# Patient Record
Sex: Male | Born: 1995 | Hispanic: No | Marital: Single | Smoking: Never smoker
Health system: Southern US, Community
[De-identification: ages and names within clinical notes are randomized; demographics above are authoritative.]

## PROBLEM LIST (undated history)

## (undated) DIAGNOSIS — F319 Bipolar disorder, unspecified: Secondary | ICD-10-CM

## (undated) DIAGNOSIS — F909 Attention-deficit hyperactivity disorder, unspecified type: Secondary | ICD-10-CM

## (undated) DIAGNOSIS — E119 Type 2 diabetes mellitus without complications: Secondary | ICD-10-CM

## (undated) DIAGNOSIS — F4481 Dissociative identity disorder: Secondary | ICD-10-CM

## (undated) DIAGNOSIS — F913 Oppositional defiant disorder: Secondary | ICD-10-CM

## (undated) DIAGNOSIS — F419 Anxiety disorder, unspecified: Secondary | ICD-10-CM

## (undated) DIAGNOSIS — F209 Schizophrenia, unspecified: Secondary | ICD-10-CM

## (undated) HISTORY — PX: HERNIA REPAIR: SHX51

---

## 2016-07-31 ENCOUNTER — Inpatient Hospital Stay (HOSPITAL_COMMUNITY)
Admission: EM | Admit: 2016-07-31 | Discharge: 2016-08-03 | DRG: 639 | Disposition: A | Discharge status: Home / Self Care | Payer: Medicaid Other | Attending: Internal Medicine | Admitting: Internal Medicine

## 2016-07-31 ENCOUNTER — Encounter (HOSPITAL_COMMUNITY): Payer: Self-pay | Admitting: Emergency Medicine

## 2016-07-31 ENCOUNTER — Emergency Department (HOSPITAL_COMMUNITY): Payer: Medicaid Other

## 2016-07-31 DIAGNOSIS — F419 Anxiety disorder, unspecified: Secondary | ICD-10-CM | POA: Diagnosis present

## 2016-07-31 DIAGNOSIS — F909 Attention-deficit hyperactivity disorder, unspecified type: Secondary | ICD-10-CM | POA: Diagnosis present

## 2016-07-31 DIAGNOSIS — Z833 Family history of diabetes mellitus: Secondary | ICD-10-CM

## 2016-07-31 DIAGNOSIS — E1010 Type 1 diabetes mellitus with ketoacidosis without coma: Principal | ICD-10-CM | POA: Diagnosis present

## 2016-07-31 DIAGNOSIS — E872 Acidosis: Secondary | ICD-10-CM | POA: Diagnosis not present

## 2016-07-31 DIAGNOSIS — F913 Oppositional defiant disorder: Secondary | ICD-10-CM | POA: Diagnosis present

## 2016-07-31 DIAGNOSIS — R739 Hyperglycemia, unspecified: Secondary | ICD-10-CM

## 2016-07-31 DIAGNOSIS — I4581 Long QT syndrome: Secondary | ICD-10-CM | POA: Diagnosis present

## 2016-07-31 DIAGNOSIS — R109 Unspecified abdominal pain: Secondary | ICD-10-CM

## 2016-07-31 DIAGNOSIS — E1065 Type 1 diabetes mellitus with hyperglycemia: Secondary | ICD-10-CM | POA: Diagnosis present

## 2016-07-31 DIAGNOSIS — F319 Bipolar disorder, unspecified: Secondary | ICD-10-CM | POA: Diagnosis present

## 2016-07-31 DIAGNOSIS — R000 Tachycardia, unspecified: Secondary | ICD-10-CM | POA: Diagnosis present

## 2016-07-31 DIAGNOSIS — E8729 Other acidosis: Secondary | ICD-10-CM | POA: Diagnosis present

## 2016-07-31 DIAGNOSIS — Z794 Long term (current) use of insulin: Secondary | ICD-10-CM

## 2016-07-31 DIAGNOSIS — R112 Nausea with vomiting, unspecified: Secondary | ICD-10-CM | POA: Diagnosis not present

## 2016-07-31 DIAGNOSIS — Z8249 Family history of ischemic heart disease and other diseases of the circulatory system: Secondary | ICD-10-CM

## 2016-07-31 DIAGNOSIS — Z79899 Other long term (current) drug therapy: Secondary | ICD-10-CM

## 2016-07-31 HISTORY — DX: Bipolar disorder, unspecified: F31.9

## 2016-07-31 HISTORY — DX: Anxiety disorder, unspecified: F41.9

## 2016-07-31 HISTORY — DX: Attention-deficit hyperactivity disorder, unspecified type: F90.9

## 2016-07-31 HISTORY — DX: Type 2 diabetes mellitus without complications: E11.9

## 2016-07-31 HISTORY — DX: Oppositional defiant disorder: F91.3

## 2016-07-31 LAB — COMPREHENSIVE METABOLIC PANEL
ALK PHOS: 154 U/L — AB (ref 38–126)
ALK PHOS: 117 U/L (ref 38–126)
ALT: 23 U/L (ref 17–63)
ALT: 17 U/L (ref 17–63)
ANION GAP: 19 — AB (ref 5–15)
AST: 25 U/L (ref 15–41)
AST: 16 U/L (ref 15–41)
Albumin: 4.7 g/dL (ref 3.5–5.0)
Albumin: 3.7 g/dL (ref 3.5–5.0)
Anion gap: 17 — ABNORMAL HIGH (ref 5–15)
BILIRUBIN TOTAL: 1.9 mg/dL — AB (ref 0.3–1.2)
BUN: 21 mg/dL — ABNORMAL HIGH (ref 6–20)
BUN: 18 mg/dL (ref 6–20)
CALCIUM: 10.2 mg/dL (ref 8.9–10.3)
CHLORIDE: 99 mmol/L — AB (ref 101–111)
CHLORIDE: 105 mmol/L (ref 101–111)
CO2: 18 mmol/L — ABNORMAL LOW (ref 22–32)
CO2: 16 mmol/L — ABNORMAL LOW (ref 22–32)
CREATININE: 0.83 mg/dL (ref 0.61–1.24)
CREATININE: 0.86 mg/dL (ref 0.61–1.24)
Calcium: 8.5 mg/dL — ABNORMAL LOW (ref 8.9–10.3)
GFR calc Af Amer: >60 mL/min (ref >60)
Glucose, Bld: 116 mg/dL — ABNORMAL HIGH (ref 65–99)
Glucose, Bld: 146 mg/dL — ABNORMAL HIGH (ref 65–99)
Potassium: 4.4 mmol/L (ref 3.5–5.1)
Potassium: 3.9 mmol/L (ref 3.5–5.1)
Sodium: 136 mmol/L (ref 135–145)
Sodium: 138 mmol/L (ref 135–145)
Total Bilirubin: 1.5 mg/dL — ABNORMAL HIGH (ref 0.3–1.2)
Total Protein: 8.4 g/dL — ABNORMAL HIGH (ref 6.5–8.1)
Total Protein: 6.5 g/dL (ref 6.5–8.1)

## 2016-07-31 LAB — URINALYSIS, ROUTINE W REFLEX MICROSCOPIC
HGB URINE DIPSTICK: NEGATIVE
Leukocytes, UA: NEGATIVE
Nitrite: NEGATIVE
PROTEIN: NEGATIVE mg/dL
Specific Gravity, Urine: 1.036 — ABNORMAL HIGH (ref 1.005–1.030)
pH: 5 (ref 5.0–8.0)

## 2016-07-31 LAB — CBC
HEMATOCRIT: 40.9 % (ref 39.0–52.0)
HEMOGLOBIN: 13.7 g/dL (ref 13.0–17.0)
MCH: 27.9 pg (ref 26.0–34.0)
MCHC: 33.5 g/dL (ref 30.0–36.0)
MCV: 83.3 fL (ref 78.0–100.0)
Platelets: 299 10*3/uL (ref 150–400)
RBC: 4.91 MIL/uL (ref 4.22–5.81)
RDW: 13.8 % (ref 11.5–15.5)
WBC: 7.5 10*3/uL (ref 4.0–10.5)

## 2016-07-31 LAB — CBC WITH DIFFERENTIAL/PLATELET
Basophils Absolute: 0.1 10*3/uL (ref 0.0–0.1)
Basophils Relative: 1 %
EOS PCT: 1 %
Eosinophils Absolute: 0 10*3/uL (ref 0.0–0.7)
HCT: 44.3 % (ref 39.0–52.0)
Hemoglobin: 15.9 g/dL (ref 13.0–17.0)
LYMPHS ABS: 2.2 10*3/uL (ref 0.7–4.0)
Lymphocytes Relative: 32 %
MCH: 28.2 pg (ref 26.0–34.0)
MCHC: 35.9 g/dL (ref 30.0–36.0)
MCV: 78.7 fL (ref 78.0–100.0)
MONOS PCT: 6 %
Monocytes Absolute: 0.4 10*3/uL (ref 0.1–1.0)
NEUTROS ABS: 4.2 10*3/uL (ref 1.7–7.7)
NEUTROS PCT: 60 %
PLATELETS: 274 10*3/uL (ref 150–400)
RBC: 5.63 MIL/uL (ref 4.22–5.81)
RDW: 13.5 % (ref 11.5–15.5)
WBC: 6.9 10*3/uL (ref 4.0–10.5)

## 2016-07-31 LAB — BLOOD GAS, VENOUS
Acid-base deficit: 3.2 mmol/L — ABNORMAL HIGH (ref 0.0–2.0)
Bicarbonate: 19.9 mmol/L — ABNORMAL LOW (ref 20.0–28.0)
O2 SAT: 95.7 %
PATIENT TEMPERATURE: 98.6
pCO2, Ven: 31.8 mmHg — ABNORMAL LOW (ref 44.0–60.0)
pH, Ven: 7.413 (ref 7.250–7.430)
pO2, Ven: 78.4 mmHg — ABNORMAL HIGH (ref 32.0–45.0)

## 2016-07-31 LAB — CBG MONITORING, ED
GLUCOSE-CAPILLARY: 107 mg/dL — AB (ref 65–99)
Glucose-Capillary: 277 mg/dL — ABNORMAL HIGH (ref 65–99)

## 2016-07-31 LAB — URINE MICROSCOPIC-ADD ON
Bacteria, UA: NONE SEEN
RBC / HPF: NONE SEEN RBC/hpf (ref 0–5)
WBC, UA: NONE SEEN WBC/hpf (ref 0–5)

## 2016-07-31 LAB — GLUCOSE, CAPILLARY: GLUCOSE-CAPILLARY: 522 mg/dL — AB (ref 65–99)

## 2016-07-31 LAB — LIPASE, BLOOD: LIPASE: 13 U/L (ref 11–51)

## 2016-07-31 LAB — I-STAT CG4 LACTIC ACID, ED
LACTIC ACID, VENOUS: 1.19 mmol/L (ref 0.5–1.9)
LACTIC ACID, VENOUS: 0.55 mmol/L (ref 0.5–1.9)

## 2016-07-31 MED ORDER — SODIUM CHLORIDE 0.9 % IV SOLN
INTRAVENOUS | Status: DC
  Administered 2016-08-01: 01:00:00 via INTRAVENOUS
  Filled 2016-07-31: qty 2.5

## 2016-07-31 MED ORDER — ACETAMINOPHEN 325 MG PO TABS: 650.0000 mg | ORAL_TABLET | Freq: Four times a day (QID) | ORAL | Status: DC | PRN

## 2016-07-31 MED ORDER — ONDANSETRON HCL 4 MG/2ML IJ SOLN
4.0000 mg | Freq: Once | INTRAMUSCULAR | Status: AC
  Administered 2016-07-31: 4 mg via INTRAVENOUS
  Filled 2016-07-31: qty 2

## 2016-07-31 MED ORDER — INSULIN ASPART 100 UNIT/ML ~~LOC~~ SOLN: 0.0000 [IU] | Freq: Three times a day (TID) | SUBCUTANEOUS | Status: DC

## 2016-07-31 MED ORDER — ENOXAPARIN SODIUM 40 MG/0.4ML ~~LOC~~ SOLN
40.0000 mg | Freq: Every day | SUBCUTANEOUS | Status: DC
  Administered 2016-08-01 – 2016-08-02 (×3): 40 mg via SUBCUTANEOUS
  Filled 2016-07-31 (×3): qty 0.4

## 2016-07-31 MED ORDER — MORPHINE SULFATE (PF) 4 MG/ML IV SOLN
4.0000 mg | Freq: Once | INTRAVENOUS | Status: AC
Start: 2016-07-31 — End: 2016-07-31
  Administered 2016-07-31: 4 mg via INTRAVENOUS
  Filled 2016-07-31: qty 1

## 2016-07-31 MED ORDER — INSULIN GLARGINE 100 UNIT/ML ~~LOC~~ SOLN: 38.0000 [IU] | Freq: Every day | SUBCUTANEOUS | Status: DC

## 2016-07-31 MED ORDER — SODIUM CHLORIDE 0.9 % IV SOLN
INTRAVENOUS | Status: DC
  Administered 2016-08-01: 01:00:00 via INTRAVENOUS

## 2016-07-31 MED ORDER — INSULIN ASPART 100 UNIT/ML ~~LOC~~ SOLN
15.0000 [IU] | Freq: Once | SUBCUTANEOUS | Status: AC
  Administered 2016-07-31: 15 [IU] via SUBCUTANEOUS

## 2016-07-31 MED ORDER — ACETAMINOPHEN 650 MG RE SUPP: 650.0000 mg | Freq: Four times a day (QID) | RECTAL | Status: DC | PRN

## 2016-07-31 MED ORDER — SODIUM CHLORIDE 0.9 % IV BOLUS (SEPSIS)
1000.0000 mL | Freq: Once | INTRAVENOUS | Status: AC
Start: 2016-07-31 — End: 2016-07-31
  Administered 2016-07-31: 1000 mL via INTRAVENOUS

## 2016-07-31 MED ORDER — LISDEXAMFETAMINE DIMESYLATE 20 MG PO CAPS
60.0000 mg | ORAL_CAPSULE | ORAL | Status: DC
Start: 2016-08-01 — End: 2016-08-03
  Administered 2016-08-01 – 2016-08-03 (×3): 60 mg via ORAL
  Filled 2016-07-31 (×3): qty 3

## 2016-07-31 MED ORDER — SODIUM CHLORIDE 0.9 % IV BOLUS (SEPSIS)
1000.0000 mL | Freq: Once | INTRAVENOUS | Status: AC
  Administered 2016-07-31 (×2): 1000 mL via INTRAVENOUS

## 2016-07-31 MED ORDER — LORATADINE 10 MG PO TABS
10.0000 mg | ORAL_TABLET | Freq: Every day | ORAL | Status: DC
  Administered 2016-08-01 – 2016-08-03 (×3): 10 mg via ORAL
  Filled 2016-07-31 (×3): qty 1

## 2016-07-31 MED ORDER — ONDANSETRON HCL 4 MG PO TABS: 4.0000 mg | ORAL_TABLET | Freq: Four times a day (QID) | ORAL | Status: DC | PRN

## 2016-07-31 MED ORDER — POTASSIUM CHLORIDE 10 MEQ/100ML IV SOLN
10.0000 meq | INTRAVENOUS | Status: DC
  Administered 2016-08-01 (×3): 10 meq via INTRAVENOUS
  Filled 2016-07-31 (×3): qty 100

## 2016-07-31 MED ORDER — GUANFACINE HCL 2 MG PO TABS
2.0000 mg | ORAL_TABLET | Freq: Every day | ORAL | Status: DC
  Administered 2016-08-01 – 2016-08-02 (×3): 2 mg via ORAL
  Filled 2016-07-31 (×3): qty 1

## 2016-07-31 MED ORDER — SODIUM CHLORIDE 0.9 % IV SOLN
INTRAVENOUS | Status: AC
  Administered 2016-07-31 – 2016-08-01 (×3): via INTRAVENOUS

## 2016-07-31 MED ORDER — TRAZODONE HCL 100 MG PO TABS
100.0000 mg | ORAL_TABLET | Freq: Every day | ORAL | Status: DC
  Administered 2016-08-01 – 2016-08-02 (×3): 100 mg via ORAL
  Filled 2016-07-31: qty 1
  Filled 2016-07-31: qty 2
  Filled 2016-07-31: qty 1

## 2016-07-31 MED ORDER — ZIPRASIDONE HCL 80 MG PO CAPS
80.0000 mg | ORAL_CAPSULE | Freq: Two times a day (BID) | ORAL | Status: DC
  Administered 2016-08-01 – 2016-08-03 (×5): 80 mg via ORAL
  Filled 2016-07-31 (×6): qty 1

## 2016-07-31 MED ORDER — ONDANSETRON HCL 4 MG/2ML IJ SOLN
4.0000 mg | Freq: Four times a day (QID) | INTRAMUSCULAR | Status: DC | PRN
Start: 2016-07-31 — End: 2016-08-03
  Administered 2016-07-31: 4 mg via INTRAVENOUS
  Filled 2016-07-31: qty 2

## 2016-07-31 MED ORDER — DEXTROSE-NACL 5-0.45 % IV SOLN: INTRAVENOUS | Status: DC

## 2016-07-31 NOTE — ED Provider Notes (Signed)
[facility]-EMERGENCY DEPT Provider Note   CSN: [id number] Arrival date & time: 07/31/16  1303     History   Chief Complaint Chief Complaint  Patient presents with  . Hyperglycemia  . Abdominal Pain    HPI Dylan Butler is a 20 y.o. male with a past medical history significant for type 1 diabetes with prior DKA, bipolar disorder, ADHD, and ODD who presents for chills, cough, polyuria, dysuria, abdominal pain, and hyperglycemia. Patient reports that he recently moved to [place] from [place] and lost his glucometer temporarily as well as testing strips. He says that for the last 6 days, he has begun having symptoms including severe abdominal pain. Patient describes the pain as diffusely in his abdomen and 10 out of 10 severity. He reports nausea with approximately 2-3 episodes of vomiting per day over the last several days. He reports some dysuria and polyuria. He reports a dry cough but no productive sputum. He denies chest pain or shortness of breath. He says that he is feeling very thirsty and dehydrated. He reports that he normally takes Lantus and Humalog. He says that 2 years ago he had DKA in the setting of pneumonia. She denies any new neurologic complaints aside from some lower extremity tingling which is chronic. Patient was tachycardic in the 130s on my evaluation.      The history is provided by the patient and a parent. No language interpreter was used.  Hyperglycemia  Blood sugar level PTA:  300 Severity:  Severe Onset quality:  Gradual Duration:  1 week Timing:  Intermittent Progression:  Worsening Chronicity:  Recurrent Diabetes status:  Controlled with insulin Current diabetic therapy:  Lantus and Humalog Context: not change in medication, not new diabetes diagnosis and not recent illness   Relieved by:  Insulin, diet and oral agents Associated symptoms: abdominal pain, dehydration, fatigue, increased thirst and nausea   Associated symptoms: no altered mental status,  no chest pain, no dizziness and no shortness of breath   Risk factors: hx of DKA     Past Medical History:  Diagnosis Date  . ADHD (attention deficit hyperactivity disorder)   . Anxiety   . Bipolar 1 disorder (HCC)   . Diabetes mellitus without complication (HCC)   . ODD (oppositional defiant disorder)     There are no active problems to display for this patient.   History reviewed. No pertinent surgical history.     Home Medications    Prior to Admission medications   Medication Sig Start Date End Date Taking? Authorizing Provider  acetaminophen (TYLENOL) 500 MG tablet Take 500 mg by mouth every 6 (six) hours as needed for headache.   Yes Historical Provider, MD  cetirizine (ZYRTEC) 10 MG tablet Take 10 mg by mouth daily.   Yes Historical Provider, MD  guanFACINE (TENEX) 2 MG tablet Take 2 mg by mouth at bedtime.   Yes Historical Provider, MD  insulin glargine (LANTUS) 100 UNIT/ML injection Inject 38 Units into the skin at bedtime.   Yes Historical Provider, MD  insulin lispro (HUMALOG) 100 UNIT/ML injection Inject 15-25 Units into the skin 3 (three) times daily before meals.   Yes Historical Provider, MD  lisdexamfetamine (VYVANSE) 60 MG capsule Take 60 mg by mouth every morning.   Yes Historical Provider, MD  ondansetron (ZOFRAN-ODT) 4 MG disintegrating tablet Take 4 mg by mouth every 8 (eight) hours as needed for nausea or vomiting.   Yes Historical Provider, MD  traZODone (DESYREL) 100 MG tablet Take 100 mg  by mouth at bedtime.   Yes Historical Provider, MD  ziprasidone (GEODON) 80 MG capsule Take 80 mg by mouth 2 (two) times daily with a meal.   Yes Historical Provider, MD    Family History History reviewed. No pertinent family history.  Social History Social History  Substance Use Topics  . Smoking status: Never Smoker  . Smokeless tobacco: Never Used  . Alcohol use Not on file     Allergies   Review of patient's allergies indicates no known  allergies.   Review of Systems Review of Systems  Constitutional: Positive for chills and fatigue.  HENT: Negative for congestion.   Eyes: Negative for visual disturbance.  Respiratory: Negative for choking, shortness of breath and stridor.   Cardiovascular: Negative for chest pain and palpitations.  Gastrointestinal: Positive for abdominal pain and nausea. Negative for constipation and diarrhea.  Endocrine: Positive for polydipsia.  Genitourinary: Negative for flank pain.  Musculoskeletal: Negative for back pain.  Skin: Negative for wound.  Neurological: Negative for dizziness and headaches.  All other systems reviewed and are negative.    Physical Exam Updated Vital Signs BP 114/74 (BP Location: Right Arm)   Pulse 114   Temp 99 F (37.2 C) (Oral)   Resp 18   Ht 5\' 1"  (1.549 m)   Wt 136 lb (61.7 kg)   SpO2 96%   BMI 25.70 kg/m   Physical Exam  Constitutional: He is oriented to person, place, and time. He appears well-developed and well-nourished. No distress.  HENT:  Head: Normocephalic and atraumatic.  Mouth/Throat: Oropharynx is clear and moist. No oropharyngeal exudate.  Eyes: Conjunctivae are normal. Pupils are equal, round, and reactive to light.  Neck: Normal range of motion. Neck supple.  Cardiovascular: Regular rhythm.  Tachycardia present.   No murmur heard. Pulmonary/Chest: Effort normal and breath sounds normal. No stridor. No respiratory distress. He has no wheezes. He has no rales. He exhibits no tenderness.  Abdominal: Soft. There is tenderness. There is no rebound.  Musculoskeletal: He exhibits no edema or tenderness.  Neurological: He is alert and oriented to person, place, and time. He has normal reflexes. No cranial nerve deficit. He exhibits normal muscle tone. Coordination normal.  Skin: Skin is warm and dry. Capillary refill takes less than 2 seconds. He is not diaphoretic. No pallor.  Psychiatric: He has a normal mood and affect.  Nursing note  and vitals reviewed.    ED Treatments / Results  Labs (all labs ordered are listed, but only abnormal results are displayed) Labs Reviewed  COMPREHENSIVE METABOLIC PANEL - Abnormal; Notable for the following:       Result Value   Chloride 99 (*)    CO2 18 (*)    Glucose, Bld 116 (*)    BUN 21 (*)    Total Protein 8.4 (*)    Alkaline Phosphatase 154 (*)    Total Bilirubin 1.5 (*)    Anion gap 19 (*)    All other components within normal limits  URINALYSIS, ROUTINE W REFLEX MICROSCOPIC (NOT AT [facility]) - Abnormal; Notable for the following:    Specific Gravity, Urine 1.036 (*)    Glucose, UA >1000 (*)    Bilirubin Urine MODERATE (*)    Ketones, ur >80 (*)    All other components within normal limits  BLOOD GAS, VENOUS - Abnormal; Notable for the following:    pCO2, Ven 31.8 (*)    pO2, Ven 78.4 (*)    Bicarbonate 19.9 (*)  Acid-base deficit 3.2 (*)    All other components within normal limits  URINE MICROSCOPIC-ADD ON - Abnormal; Notable for the following:    Squamous Epithelial / LPF 0-5 (*)    All other components within normal limits  COMPREHENSIVE METABOLIC PANEL - Abnormal; Notable for the following:    CO2 16 (*)    Glucose, Bld 146 (*)    Calcium 8.5 (*)    Total Bilirubin 1.9 (*)    Anion gap 17 (*)    All other components within normal limits  BASIC METABOLIC PANEL - Abnormal; Notable for the following:    Sodium 131 (*)    Chloride 99 (*)    CO2 11 (*)    Glucose, Bld 505 (*)    Calcium 8.8 (*)    Anion gap 21 (*)    All other components within normal limits  CBC - Abnormal; Notable for the following:    HCT 38.8 (*)    All other components within normal limits  COMPREHENSIVE METABOLIC PANEL - Abnormal; Notable for the following:    Sodium 134 (*)    CO2 19 (*)    Glucose, Bld 115 (*)    Calcium 8.8 (*)    Total Bilirubin 1.4 (*)    All other components within normal limits  URINE RAPID DRUG SCREEN, HOSP PERFORMED - Abnormal; Notable for the  following:    Amphetamines POSITIVE (*)    All other components within normal limits  GLUCOSE, CAPILLARY - Abnormal; Notable for the following:    Glucose-Capillary 522 (*)    All other components within normal limits  BASIC METABOLIC PANEL - Abnormal; Notable for the following:    CO2 20 (*)    Glucose, Bld 114 (*)    Calcium 8.8 (*)    All other components within normal limits  BASIC METABOLIC PANEL - Abnormal; Notable for the following:    Sodium 132 (*)    CO2 17 (*)    Glucose, Bld 134 (*)    Calcium 8.7 (*)    All other components within normal limits  GLUCOSE, CAPILLARY - Abnormal; Notable for the following:    Glucose-Capillary 304 (*)    All other components within normal limits  GLUCOSE, CAPILLARY - Abnormal; Notable for the following:    Glucose-Capillary 176 (*)    All other components within normal limits  GLUCOSE, CAPILLARY - Abnormal; Notable for the following:    Glucose-Capillary 109 (*)    All other components within normal limits  GLUCOSE, CAPILLARY - Abnormal; Notable for the following:    Glucose-Capillary 104 (*)    All other components within normal limits  GLUCOSE, CAPILLARY - Abnormal; Notable for the following:    Glucose-Capillary 106 (*)    All other components within normal limits  GLUCOSE, CAPILLARY - Abnormal; Notable for the following:    Glucose-Capillary 130 (*)    All other components within normal limits  GLUCOSE, CAPILLARY - Abnormal; Notable for the following:    Glucose-Capillary 147 (*)    All other components within normal limits  CBG MONITORING, ED - Abnormal; Notable for the following:    Glucose-Capillary 277 (*)    All other components within normal limits  CBG MONITORING, ED - Abnormal; Notable for the following:    Glucose-Capillary 107 (*)    All other components within normal limits  CBC WITH DIFFERENTIAL/PLATELET  LIPASE, BLOOD  LACTIC ACID, PLASMA  TROPONIN I  TSH  CBC  HEMOGLOBIN A1C  I-STAT  CG4 LACTIC ACID, ED   I-STAT CG4 LACTIC ACID, ED    EKG  EKG Interpretation None       Radiology Dg Chest 2 View  Result Date: 07/31/2016 CLINICAL DATA:  High blood sugar.  Chest pain. EXAM: CHEST  2 VIEW COMPARISON:  None. FINDINGS: The heart size and mediastinal contours are within normal limits. Both lungs are clear. The visualized skeletal structures are unremarkable. IMPRESSION: No active cardiopulmonary disease. Electronically Signed   By: Gerome Sam III M.D   On: 07/31/2016 16:05    Procedures Procedures (including critical care time)  Medications Ordered in ED Medications  loratadine (CLARITIN) tablet 10 mg (10 mg Oral Given 08/01/16 1018)  guanFACINE (TENEX) tablet 2 mg (2 mg Oral Given 08/01/16 0107)  lisdexamfetamine (VYVANSE) capsule 60 mg (60 mg Oral Given 08/01/16 0617)  traZODone (DESYREL) tablet 100 mg (100 mg Oral Given 08/01/16 0022)  ziprasidone (GEODON) capsule 80 mg (80 mg Oral Given 08/01/16 1019)  acetaminophen (TYLENOL) tablet 650 mg (not administered)    Or  acetaminophen (TYLENOL) suppository 650 mg (not administered)  ondansetron (ZOFRAN) tablet 4 mg ( Oral See Alternative 07/31/16 2332)    Or  ondansetron (ZOFRAN) injection 4 mg (4 mg Intravenous Given 07/31/16 2332)  0.9 %  sodium chloride infusion ( Intravenous New Bag/Given 08/01/16 1428)  enoxaparin (LOVENOX) injection 40 mg (40 mg Subcutaneous Given 08/01/16 0022)  insulin aspart (novoLOG) injection 0-9 Units (2 Units Subcutaneous Given 08/01/16 1109)  insulin glargine (LANTUS) injection 30 Units (30 Units Subcutaneous Given 08/01/16 1108)  sodium chloride 0.9 % bolus 1,000 mL (0 mLs Intravenous Stopped 07/31/16 1849)  sodium chloride 0.9 % bolus 1,000 mL (0 mLs Intravenous Stopped 07/31/16 1643)  ondansetron (ZOFRAN) injection 4 mg (4 mg Intravenous Given 07/31/16 1518)  morphine 4 MG/ML injection 4 mg (4 mg Intravenous Given 07/31/16 1520)  sodium chloride 0.9 % bolus 1,000 mL (0 mLs Intravenous Stopped 08/01/16 0700)   insulin aspart (novoLOG) injection 15 Units (15 Units Subcutaneous Given 07/31/16 2322)  potassium chloride 10 MEQ/100ML IVPB (  Given 08/01/16 0730)     Initial Impression / Assessment and Plan / ED Course  I have reviewed the triage vital signs and the nursing notes.  Pertinent labs & imaging results that were available during my care of the patient were reviewed by me and considered in my medical decision making (see chart for details).  Clinical Course  Value Comment By Time  pH, Ven: 7.413 (Reviewed) Canary Brim Tegeler, MD 09/26 1624    Dylan Butler is a 20 y.o. male with a past medical history significant for type 1 diabetes with prior DKA, bipolar disorder, ADHD, and ODD who presents for chills, cough, polyuria, dysuria, abdominal pain, and hyperglycemia.   History and exam seen above. Clinically, there's concern for DKA with his tachycardia, appearance of dehydration, abdominal pain, and hyperglycemia. Laboratory testing results showed hyperglycemia, fluids started.   Patient was found to have slightly elevated anion gap, low CO2, Ketones in the urine, however his pH was normal. Glucose improved to 107 after fluids.  Patient did not have any evidence of acute infection.  Given patients persistent tachycardia despite improvement in glucose, feel patient is likely dehydrated. Also concern for patient having recurrent hyperglycemia. Patient admitted to internal medicine service for further management of hyperglycemia and dehydration. As patient is not acidotic and as glucose is not elevated, will hold on insulin initially. Will defer to admitting team for restarting of insulin management. An ion  gap improved in subsequent BMP.  Patient had no other problems and family agreed with admission. Patient admitted in stable condition for further glucose management.    Final Clinical Impressions(s) / ED Diagnoses   Final diagnoses:  Tachycardia  Abdominal pain, unspecified abdominal  location  Hyperglycemia    New Prescriptions New Prescriptions   No medications on file    Clinical Impression: 1. Tachycardia   2. Abdominal pain, unspecified abdominal location   3. Hyperglycemia     Disposition: Admit to Hospitalist service    Heide Scales, MD 08/01/16 1517

## 2016-07-31 NOTE — ED Notes (Signed)
MD at bedside. 

## 2016-07-31 NOTE — ED Notes (Signed)
Bed: [id number] Expected date:  Expected time:  Means of arrival:  Comments: [id number]-[id number]

## 2016-07-31 NOTE — H&P (Signed)
History and Physical    Dylan FergusonKaleb Butler ZOX:[id number] DOB: 04/12/1996 DOA: 07/31/2016  PCP: No primary care provider on file.  Patient coming from: Home.  Chief Complaint: Elevated blood sugar.  HPI: Dylan FergusonKaleb Butler is a 20 y.o. male with history of diabetes mellitus type 1, ADHD, ODD was brought to the ER after patient's blood sugar was not recordable in the patient's glucometer since it was markedly elevated. Patient also has been having persistent nausea vomiting. Denies any diarrhea. In the ER patient's blood sugar was in the 100s with elevated anion gap initially 19 following fluid bolus it decreased to 17. Since patient still had persistent nausea vomiting patient was admitted. At the time of my examination patient has already had some crackers and was able to tolerate by mouth's. But patient's blood sugar again increased to 500s for which I have ordered repeat stat metabolic panel and will be transferring patient to stepdown unit for diabetic ketoacidosis. Have placed patient on IV insulin infusion for DKA protocol.  On my exam patient has mild epigastric tenderness. Patient otherwise denies any chest pain or shortness of breath. Over the last few weeks patient has been having persistent nausea vomiting but denies any diarrhea. Patient has just recently moved from [place] 3 weeks ago and had gone to his PCP prior to [facility] [place] vomiting which was symptomatically treated.   ED Course: 3 L of fluid bolus was given.  Review of Systems: As per HPI, rest all negative.   Past Medical History:  Diagnosis Date  . ADHD (attention deficit hyperactivity disorder)   . Anxiety   . Bipolar 1 disorder (HCC)   . Diabetes mellitus without complication (HCC)   . ODD (oppositional defiant disorder)     Past Surgical History:  Procedure Laterality Date  . HERNIA REPAIR       reports that he has never smoked. He has never used smokeless tobacco. He reports that he does not drink  alcohol. His drug history is not on file.  No Known Allergies  Family History  Problem Relation Age of Onset  . Diabetes Mellitus II Maternal Grandmother   . CAD Maternal Grandmother     Prior to Admission medications   Medication Sig Start Date End Date Taking? Authorizing Provider  acetaminophen (TYLENOL) 500 MG tablet Take 500 mg by mouth every 6 (six) hours as needed for headache.   Yes Historical Provider, MD  cetirizine (ZYRTEC) 10 MG tablet Take 10 mg by mouth daily.   Yes Historical Provider, MD  guanFACINE (TENEX) 2 MG tablet Take 2 mg by mouth at bedtime.   Yes Historical Provider, MD  insulin glargine (LANTUS) 100 UNIT/ML injection Inject 38 Units into the skin at bedtime.   Yes Historical Provider, MD  insulin lispro (HUMALOG) 100 UNIT/ML injection Inject 15-25 Units into the skin 3 (three) times daily before meals.   Yes Historical Provider, MD  lisdexamfetamine (VYVANSE) 60 MG capsule Take 60 mg by mouth every morning.   Yes Historical Provider, MD  ondansetron (ZOFRAN-ODT) 4 MG disintegrating tablet Take 4 mg by mouth every 8 (eight) hours as needed for nausea or vomiting.   Yes Historical Provider, MD  traZODone (DESYREL) 100 MG tablet Take 100 mg by mouth at bedtime.   Yes Historical Provider, MD  ziprasidone (GEODON) 80 MG capsule Take 80 mg by mouth 2 (two) times daily with a meal.   Yes Historical Provider, MD    Physical Exam: Vitals:   07/31/16 2100  07/31/16 2116 07/31/16 2200 07/31/16 2226  BP: (!) 113/52 (!) 113/52 (!) 108/52 127/73  Pulse:  100  (!) 102  Resp: 24 22 24 20   Temp:    98.5 F (36.9 C)  TempSrc:    Oral  SpO2:  98%  100%  Weight:    113 lb 8.6 oz (51.5 kg)  Height:    5\' 1"  (1.549 m)      Constitutional: Not in distress. Vitals:   07/31/16 2100 07/31/16 2116 07/31/16 2200 07/31/16 2226  BP: (!) 113/52 (!) 113/52 (!) 108/52 127/73  Pulse:  100  (!) 102  Resp: 24 22 24 20   Temp:    98.5 F (36.9 C)  TempSrc:    Oral  SpO2:  98%  100%   Weight:    113 lb 8.6 oz (51.5 kg)  Height:    5\' 1"  (1.549 m)   Eyes: Anicteric no pallor. ENMT: No discharge from the ears eyes nose or mouth. Neck: No JVD elevated no mass felt. Respiratory: No rhonchi or crepitations. Cardiovascular: S1-S2 heard. No murmur appreciated. Abdomen: Soft mild epigastric tenderness no organomegaly. Musculoskeletal: No edema. No obvious joint effusion. Skin: No skin rash. Neurologic: Alert awake oriented to time place and person and moves all extremities. Psychiatric: Appears normal. Good affect.   Labs on Admission: I have personally reviewed following labs and imaging studies  CBC:  Recent Labs Lab 07/31/16 1508  WBC 6.9  NEUTROABS 4.2  HGB 15.9  HCT 44.3  MCV 78.7  PLT 274   Basic Metabolic Panel:  Recent Labs Lab 07/31/16 1508 07/31/16 1807  NA 136 138  K 4.4 3.9  CL 99* 105  CO2 18* 16*  GLUCOSE 116* 146*  BUN 21* 18  CREATININE 0.83 0.86  CALCIUM 10.2 8.5*   GFR: Estimated Creatinine Clearance: 100.6 mL/min (by C-G formula based on SCr of 0.86 mg/dL). Liver Function Tests:  Recent Labs Lab 07/31/16 1508 07/31/16 1807  AST 25 16  ALT 23 17  ALKPHOS 154* 117  BILITOT 1.5* 1.9*  PROT 8.4* 6.5  ALBUMIN 4.7 3.7    Recent Labs Lab 07/31/16 1508  LIPASE 13   No results for input(s): AMMONIA in the last 168 hours. Coagulation Profile: No results for input(s): INR, PROTIME in the last 168 hours. Cardiac Enzymes: No results for input(s): CKTOTAL, CKMB, CKMBINDEX, TROPONINI in the last 168 hours. BNP (last 3 results) No results for input(s): PROBNP in the last 8760 hours. HbA1C: No results for input(s): HGBA1C in the last 72 hours. CBG:  Recent Labs Lab 07/31/16 1318 07/31/16 1640  GLUCAP 277* 107*   Lipid Profile: No results for input(s): CHOL, HDL, LDLCALC, TRIG, CHOLHDL, LDLDIRECT in the last 72 hours. Thyroid Function Tests: No results for input(s): TSH, T4TOTAL, FREET4, T3FREE, THYROIDAB in the last  72 hours. Anemia Panel: No results for input(s): VITAMINB12, FOLATE, FERRITIN, TIBC, IRON, RETICCTPCT in the last 72 hours. Urine analysis:    Component Value Date/Time   COLORURINE YELLOW 07/31/2016 1450   APPEARANCEUR CLEAR 07/31/2016 1450   LABSPEC 1.036 (H) 07/31/2016 1450   PHURINE 5.0 07/31/2016 1450   GLUCOSEU >1000 (A) 07/31/2016 1450   HGBUR NEGATIVE 07/31/2016 1450   BILIRUBINUR MODERATE (A) 07/31/2016 1450   KETONESUR >80 (A) 07/31/2016 1450   PROTEINUR NEGATIVE 07/31/2016 1450   NITRITE NEGATIVE 07/31/2016 1450   LEUKOCYTESUR NEGATIVE 07/31/2016 1450   Sepsis Labs: @LABRCNTIP (procalcitonin:4,lacticidven:4) )No results found for this or any previous visit (from the past 240 hour(s)).  Radiological Exams on Admission: Dg Chest 2 View  Result Date: 07/31/2016 CLINICAL DATA:  High blood sugar.  Chest pain. EXAM: CHEST  2 VIEW COMPARISON:  None. FINDINGS: The heart size and mediastinal contours are within normal limits. Both lungs are clear. The visualized skeletal structures are unremarkable. IMPRESSION: No active cardiopulmonary disease. Electronically Signed   By: Gerome Sam III M.D   On: 07/31/2016 16:05    EKG: Independently reviewed. Sinus tachycardia with nonspecific T-wave changes.  Assessment/Plan Principal Problem:   Ketoacidosis Active Problems:   Tachycardia   Controlled type 1 diabetes mellitus with hyperglycemia (HCC)   Nausea with vomiting    1. Diabetic ketoacidosis in type I diabetic - patient has been placed on IV insulin infusion for DKA protocol and continue with IV fluid infusions. Closely follow metabolic panel. Once anion gap is corrected will change back to long-acting insulin. Patient's mother states patient's last hemoglobin A1c around 6 months ago was 8.5. And as per the family patient has not missed his Lantus dose and sliding scale coverage. Precipitating cause for DKA not clear. 2. Persistent nausea vomiting - suspect most likely from  diabetic gastroparesis. There is mild epigastric tenderness. LFTs only show mildly elevated bilirubin. If vomiting persists may consider further imaging. 3. History of ADHD and ODD - continue present medications once patient can take orally.   DVT prophylaxis: Lovenox. Code Status: Full code.  Family Communication: Patient's mother and father.  Disposition Plan: Home.  Consults called: Case manager to help with scheduling outpatient appointment.  Admission status: Observation.    Eduard Clos MD [facility] Pager [phone].  If 7PM-7AM, please contact night-coverage [email or web address] Password [facility]  07/31/2016, 11:07 PM

## 2016-07-31 NOTE — Progress Notes (Addendum)
EDCM consulted to provide list of providers who accept patient's insurance.  Patient listed as having [employer].  This is not insurance.  EDCM asked registration to see if patient has regular Medicaid of [place] or Medicaid [employer] insurance.  Per registration, patient has Medicaid of [place].   EDCM went to bedside.  Patient's mother not at bedside currently.  EDCM left list of providers who accept Medicaid with patient's mother's belongings on chair in room.  No further EDCM needs at this time.

## 2016-07-31 NOTE — ED Triage Notes (Signed)
Per EMS, pt from home, c/o hyperglycemia. CBG 389 with EMS. Denies N/V. Hx Type 1 diabetes and ODD.

## 2016-08-01 DIAGNOSIS — E872 Acidosis: Secondary | ICD-10-CM | POA: Diagnosis not present

## 2016-08-01 DIAGNOSIS — Z833 Family history of diabetes mellitus: Secondary | ICD-10-CM | POA: Diagnosis not present

## 2016-08-01 DIAGNOSIS — Z8249 Family history of ischemic heart disease and other diseases of the circulatory system: Secondary | ICD-10-CM | POA: Diagnosis not present

## 2016-08-01 DIAGNOSIS — R739 Hyperglycemia, unspecified: Secondary | ICD-10-CM | POA: Diagnosis present

## 2016-08-01 DIAGNOSIS — F909 Attention-deficit hyperactivity disorder, unspecified type: Secondary | ICD-10-CM | POA: Diagnosis present

## 2016-08-01 DIAGNOSIS — F319 Bipolar disorder, unspecified: Secondary | ICD-10-CM | POA: Diagnosis present

## 2016-08-01 DIAGNOSIS — E1010 Type 1 diabetes mellitus with ketoacidosis without coma: Secondary | ICD-10-CM | POA: Diagnosis present

## 2016-08-01 DIAGNOSIS — Z79899 Other long term (current) drug therapy: Secondary | ICD-10-CM | POA: Diagnosis not present

## 2016-08-01 DIAGNOSIS — I4581 Long QT syndrome: Secondary | ICD-10-CM | POA: Diagnosis present

## 2016-08-01 DIAGNOSIS — F913 Oppositional defiant disorder: Secondary | ICD-10-CM | POA: Diagnosis present

## 2016-08-01 DIAGNOSIS — Z794 Long term (current) use of insulin: Secondary | ICD-10-CM | POA: Diagnosis not present

## 2016-08-01 DIAGNOSIS — R000 Tachycardia, unspecified: Secondary | ICD-10-CM | POA: Diagnosis present

## 2016-08-01 DIAGNOSIS — F419 Anxiety disorder, unspecified: Secondary | ICD-10-CM | POA: Diagnosis present

## 2016-08-01 LAB — CBC
HEMATOCRIT: 38.8 % — AB (ref 39.0–52.0)
HEMOGLOBIN: 13.1 g/dL (ref 13.0–17.0)
MCH: 28 pg (ref 26.0–34.0)
MCHC: 33.8 g/dL (ref 30.0–36.0)
MCV: 82.9 fL (ref 78.0–100.0)
Platelets: 283 10*3/uL (ref 150–400)
RBC: 4.68 MIL/uL (ref 4.22–5.81)
RDW: 13.9 % (ref 11.5–15.5)
WBC: 8.3 10*3/uL (ref 4.0–10.5)

## 2016-08-01 LAB — BASIC METABOLIC PANEL
ANION GAP: 21 — AB (ref 5–15)
ANION GAP: 14 (ref 5–15)
Anion gap: 10 (ref 5–15)
BUN: 15 mg/dL (ref 6–20)
BUN: 13 mg/dL (ref 6–20)
BUN: 11 mg/dL (ref 6–20)
CALCIUM: 8.8 mg/dL — AB (ref 8.9–10.3)
CALCIUM: 8.7 mg/dL — AB (ref 8.9–10.3)
CHLORIDE: 99 mmol/L — AB (ref 101–111)
CHLORIDE: 105 mmol/L (ref 101–111)
CHLORIDE: 101 mmol/L (ref 101–111)
CO2: 11 mmol/L — AB (ref 22–32)
CO2: 20 mmol/L — AB (ref 22–32)
CO2: 17 mmol/L — AB (ref 22–32)
CREATININE: 0.76 mg/dL (ref 0.61–1.24)
CREATININE: 0.77 mg/dL (ref 0.61–1.24)
Calcium: 8.8 mg/dL — ABNORMAL LOW (ref 8.9–10.3)
Creatinine, Ser: 1.06 mg/dL (ref 0.61–1.24)
GFR calc Af Amer: >60 mL/min (ref >60)
GFR calc non Af Amer: >60 mL/min (ref >60)
GFR calc non Af Amer: >60 mL/min (ref >60)
GFR calc non Af Amer: >60 mL/min (ref >60)
GLUCOSE: 505 mg/dL — AB (ref 65–99)
GLUCOSE: 114 mg/dL — AB (ref 65–99)
GLUCOSE: 134 mg/dL — AB (ref 65–99)
POTASSIUM: 4.7 mmol/L (ref 3.5–5.1)
Potassium: 3.8 mmol/L (ref 3.5–5.1)
Potassium: 4.3 mmol/L (ref 3.5–5.1)
Sodium: 131 mmol/L — ABNORMAL LOW (ref 135–145)
Sodium: 135 mmol/L (ref 135–145)
Sodium: 132 mmol/L — ABNORMAL LOW (ref 135–145)

## 2016-08-01 LAB — COMPREHENSIVE METABOLIC PANEL
ALK PHOS: 119 U/L (ref 38–126)
ALT: 17 U/L (ref 17–63)
ANION GAP: 11 (ref 5–15)
AST: 16 U/L (ref 15–41)
Albumin: 3.7 g/dL (ref 3.5–5.0)
BILIRUBIN TOTAL: 1.4 mg/dL — AB (ref 0.3–1.2)
BUN: 12 mg/dL (ref 6–20)
CALCIUM: 8.8 mg/dL — AB (ref 8.9–10.3)
CO2: 19 mmol/L — ABNORMAL LOW (ref 22–32)
Chloride: 104 mmol/L (ref 101–111)
Creatinine, Ser: 0.76 mg/dL (ref 0.61–1.24)
GLUCOSE: 115 mg/dL — AB (ref 65–99)
POTASSIUM: 3.7 mmol/L (ref 3.5–5.1)
Sodium: 134 mmol/L — ABNORMAL LOW (ref 135–145)
TOTAL PROTEIN: 6.5 g/dL (ref 6.5–8.1)

## 2016-08-01 LAB — RAPID URINE DRUG SCREEN, HOSP PERFORMED
Amphetamines: POSITIVE — AB
BARBITURATES: NOT DETECTED
BENZODIAZEPINES: NOT DETECTED
COCAINE: NOT DETECTED
Opiates: NOT DETECTED
Tetrahydrocannabinol: NOT DETECTED

## 2016-08-01 LAB — GLUCOSE, CAPILLARY
GLUCOSE-CAPILLARY: 176 mg/dL — AB (ref 65–99)
GLUCOSE-CAPILLARY: 109 mg/dL — AB (ref 65–99)
GLUCOSE-CAPILLARY: 104 mg/dL — AB (ref 65–99)
GLUCOSE-CAPILLARY: 187 mg/dL — AB (ref 65–99)
GLUCOSE-CAPILLARY: 129 mg/dL — AB (ref 65–99)
GLUCOSE-CAPILLARY: 152 mg/dL — AB (ref 65–99)
GLUCOSE-CAPILLARY: 353 mg/dL — AB (ref 65–99)
GLUCOSE-CAPILLARY: 244 mg/dL — AB (ref 65–99)
Glucose-Capillary: 304 mg/dL — ABNORMAL HIGH (ref 65–99)
Glucose-Capillary: 106 mg/dL — ABNORMAL HIGH (ref 65–99)
Glucose-Capillary: 130 mg/dL — ABNORMAL HIGH (ref 65–99)
Glucose-Capillary: 147 mg/dL — ABNORMAL HIGH (ref 65–99)

## 2016-08-01 LAB — TSH: TSH: 1.09 u[IU]/mL (ref 0.350–4.500)

## 2016-08-01 LAB — LACTIC ACID, PLASMA: Lactic Acid, Venous: 1 mmol/L (ref 0.5–1.9)

## 2016-08-01 LAB — TROPONIN I: Troponin I: <0.03 ng/mL (ref <0.03)

## 2016-08-01 MED ORDER — INSULIN ASPART 100 UNIT/ML ~~LOC~~ SOLN
0.0000 [IU] | Freq: Three times a day (TID) | SUBCUTANEOUS | Status: DC
  Administered 2016-08-01: 2 [IU] via SUBCUTANEOUS
  Administered 2016-08-01: 9 [IU] via SUBCUTANEOUS
  Administered 2016-08-02: 2 [IU] via SUBCUTANEOUS

## 2016-08-01 MED ORDER — INSULIN GLARGINE 100 UNIT/ML ~~LOC~~ SOLN
30.0000 [IU] | Freq: Every day | SUBCUTANEOUS | Status: DC
  Administered 2016-08-01 – 2016-08-02 (×2): 30 [IU] via SUBCUTANEOUS
  Filled 2016-08-01 (×2): qty 0.3

## 2016-08-01 MED ORDER — POTASSIUM CHLORIDE 10 MEQ/100ML IV SOLN
INTRAVENOUS | Status: AC
  Administered 2016-08-01: 08:00:00
  Filled 2016-08-01: qty 100

## 2016-08-01 NOTE — Progress Notes (Signed)
Patient ID: Dylan Butler, male   DOB: 08/10/1996, 20 y.o.   MRN: [id number]    PROGRESS NOTE    Dylan Butler  WUJ:[id number] DOB: 10/25/1996 DOA: 07/31/2016  PCP[facility] and will [facility]  Brief Narrative:  20 y.o. male with history of diabetes mellitus type 1, ADHD, ODD was brought to the ER after patient's blood sugar was not recordable in the patient's glucometer since it was markedly elevated. Patient also has been having persistent nausea and vomiting.   Assessment & Plan:   Principal Problem:   DKA  - better this AM, AG 14 - plan to take off insulin drip - allow pt to eat this am - transition to home regimen insulin  - diabetic educator consulted, assistance appreciated   Active Problems:   Tachycardia - reactive due to the above - resolved  - ok to transfer to medical unit     Nausea with vomiting - due to DKA - resolved - advanced diet   DVT prophylaxis: Lovenox Sq Code Status: Full  Family Communication: Patient and mother at bedside  Disposition Plan: Home in 1-2 days  Consultants:   Diabetic educator   Procedures:   None  Antimicrobials:   None   Subjective: No events overnight, wants to eat this am.   Objective: Vitals:   08/01/16 0409 08/01/16 0752 08/01/16 0800 08/01/16 1000  BP:   132/67 119/62  Pulse:   73 99  Resp:   (!) 21 (!) 24  Temp:  98.3 F (36.8 C)    TempSrc:  Oral    SpO2:   98% 99%  Weight: 56.7 kg (125 lb)     Height:        Intake/Output Summary (Last 24 hours) at 08/01/16 1205 Last data filed at 08/01/16 0214  Gross per 24 hour  Intake          2008.33 ml  Output              500 ml  Net          1508.33 ml   Filed Weights   07/31/16 1315 07/31/16 2226 08/01/16 0409  Weight: 61.7 kg (136 lb) 51.5 kg (113 lb 8.6 oz) 56.7 kg (125 lb)    Examination:  General exam: Appears calm and comfortable  Respiratory system: Clear to auscultation. Respiratory effort normal. Cardiovascular system: S1 &  S2 heard, RRR. No JVD, murmurs, rubs, gallops or clicks. Gastrointestinal system: Abdomen is nondistended, soft and nontender. No organomegaly or masses felt. Normal bowel sounds heard. Central nervous system: Alert and oriented. No focal neurological deficits. Extremities: Symmetric 5 x 5 power.  Data Reviewed: I have personally reviewed following labs and imaging studies  CBC:  Recent Labs Lab 07/31/16 1508 07/31/16 2326 08/01/16 0332  WBC 6.9 7.5 8.3  NEUTROABS 4.2  --   --   HGB 15.9 13.7 13.1  HCT 44.3 40.9 38.8*  MCV 78.7 83.3 82.9  PLT 274 299 283   Basic Metabolic Panel:  Recent Labs Lab 07/31/16 1508 07/31/16 1807 07/31/16 2326 08/01/16 0332 08/01/16 0658  NA 136 138 131* 134*  135 132*  K 4.4 3.9 4.7 3.7  3.8 4.3  CL 99* 105 99* 104  105 101  CO2 18* 16* 11* 19*  20* 17*  GLUCOSE 116* 146* 505* 115*  114* 134*  BUN 21* 18 15 12  13 11   CREATININE 0.83 0.86 1.06 0.76  0.76 0.77  CALCIUM 10.2 8.5*  8.8* 8.8*  8.8* 8.7*   Liver Function Tests:  Recent Labs Lab 07/31/16 1508 07/31/16 1807 08/01/16 0332  AST 25 16 16   ALT 23 17 17   ALKPHOS 154* 117 119  BILITOT 1.5* 1.9* 1.4*  PROT 8.4* 6.5 6.5  ALBUMIN 4.7 3.7 3.7    Recent Labs Lab 07/31/16 1508  LIPASE 13   Cardiac Enzymes:  Recent Labs Lab 07/31/16 2326  TROPONINI <0.03   CBG:  Recent Labs Lab 08/01/16 0304 08/01/16 0426 08/01/16 0529 08/01/16 0640 08/01/16 0856  GLUCAP 109* 104* 106* 130* 147*   Thyroid Function Tests:  Recent Labs  07/31/16 2326  TSH 1.090   Urine analysis:    Component Value Date/Time   COLORURINE YELLOW 07/31/2016 1450   APPEARANCEUR CLEAR 07/31/2016 1450   LABSPEC 1.036 (H) 07/31/2016 1450   PHURINE 5.0 07/31/2016 1450   GLUCOSEU >1000 (A) 07/31/2016 1450   HGBUR NEGATIVE 07/31/2016 1450   BILIRUBINUR MODERATE (A) 07/31/2016 1450   KETONESUR >80 (A) 07/31/2016 1450   PROTEINUR NEGATIVE 07/31/2016 1450   NITRITE NEGATIVE 07/31/2016  1450   LEUKOCYTESUR NEGATIVE 07/31/2016 1450    Radiology Studies: Dg Chest 2 View  Result Date: 07/31/2016 CLINICAL DATA:  High blood sugar.  Chest pain. EXAM: CHEST  2 VIEW COMPARISON:  None. FINDINGS: The heart size and mediastinal contours are within normal limits. Both lungs are clear. The visualized skeletal structures are unremarkable. IMPRESSION: No active cardiopulmonary disease. Electronically Signed   By: Gerome Sam III M.D   On: 07/31/2016 16:05     Scheduled Meds: . enoxaparin (LOVENOX) injection  40 mg Subcutaneous QHS  . guanFACINE  2 mg Oral QHS  . insulin aspart  0-9 Units Subcutaneous TID WC  . insulin glargine  30 Units Subcutaneous Daily  . lisdexamfetamine  60 mg Oral BH-q7a  . loratadine  10 mg Oral Daily  . traZODone  100 mg Oral QHS  . ziprasidone  80 mg Oral BID WC   Continuous Infusions: . sodium chloride 150 mL/hr at 07/31/16 2323  . sodium chloride Stopped (08/01/16 0214)  . dextrose 5 % and 0.45% NaCl 150 mL/hr at 08/01/16 0700     LOS: 0 days   Time spent: 20 minutes   Debbora Presto, MD [facility] Pager [phone]  If 7PM-7AM, please contact night-coverage [email or web address] Password [facility] 08/01/2016, 12:05 PM

## 2016-08-01 NOTE — Progress Notes (Signed)
Inpatient Diabetes Program Recommendations  AACE/ADA: New Consensus Statement on Inpatient Glycemic Control (2015)  Target Ranges:  Prepandial:   less than 140 mg/dL      Peak postprandial:   less than 180 mg/dL (1-2 hours)      Critically ill patients:  140 - 180 mg/dL   Results for LAZER, WOLLARD (MRN [id number]) as of 08/01/2016 09:07  Ref. Range 07/31/2016 23:26  Sodium Latest Ref Range: 135 - 145 mmol/L 131 (L)  Potassium Latest Ref Range: 3.5 - 5.1 mmol/L 4.7  Chloride Latest Ref Range: 101 - 111 mmol/L 99 (L)  CO2 Latest Ref Range: 22 - 32 mmol/L 11 (L)  BUN Latest Ref Range: 6 - 20 mg/dL 15  Creatinine Latest Ref Range: 0.61 - 1.24 mg/dL 1.06  Calcium Latest Ref Range: 8.9 - 10.3 mg/dL 8.8 (L)  EGFR (Non-African Amer.) Latest Ref Range: >60 mL/min >60  EGFR (African American) Latest Ref Range: >60 mL/min >60  Glucose Latest Ref Range: 65 - 99 mg/dL 505 (HH)  Anion gap Latest Ref Range: 5 - 15  21 (H)    Admit with: DKA  History: Type 1 DM  Home DM Meds: Lantus 38 units QHS       Humalog 15-25 units tidwc  Current Insulin Orders: IV Insulin drip     -Note 7am BMET shows Anion Gap still elevated at 14 and CO2 still low at 17.  Not ready for transition to SQ Insulin just yet.  -Current A1c in process.    MD- Once patient ready to transition to SQ Insulin (CO2 at least 20, Anion Gap <12), please make sure patient receives his home dose of Lantus at least 1-2 hours prior to d/c of IV Insulin drip.  At time of transition, would also start Novolog Sensitive Correction Scale/ SSI (0-9 units) TID AC + HS   Patient will also likely need Novolog Meal Coverage as well.        --Will follow patient during hospitalization--  Wyn Quaker RN, MSN, CDE Diabetes Coordinator Inpatient Glycemic Control Team Team Pager: [phone] (8a-5p)

## 2016-08-01 NOTE — Progress Notes (Signed)
Inpatient Diabetes Program Recommendations  AACE/ADA: New Consensus Statement on Inpatient Glycemic Control (2015)  Target Ranges:  Prepandial:   less than 140 mg/dL      Peak postprandial:   less than 180 mg/dL (1-2 hours)      Critically ill patients:  140 - 180 mg/dL    -Spoke with patient's legal guardian at bedside as patient still very sleepy and could not stay awake to talk with me.  -Legal guardian (referred to herself as "Mom") told me that patient [place].  Patient has all insulins and DM supplies.  Has been battling a stomach virus for a couple of weeks per Mom and they have been trying to keep patient hydrated and give him insulin as prescribed.    -Explained to Mom all the treatments we have given patient (IVF, IV Insulin drip, frequent labs, etc).  Explained what DKA is and how to treat it.  Also explained how we transitioned pt off the IV Insulin drip.  Mom stated she was unfamiliar with IV Insulin as a treatment and was glad that we have provided such extensive care to her son.  -Mom told me that patient takes the following insulin at home:  Lantus 38 units QHS  Humalog 15 units Breakfast (20 units if it's a big meal)  Humalog 15 units Lunch (20 units if it's a big meal)  Humalog 20 units Dinner (25 units if it's a big meal)     MD- Once patient starts to eat consistently, please start Novolog Meal Coverage for this patient:  Recommend Novolog 10 units tid with meals to start (hold if pt eats <50% of meal)      --Will follow patient during hospitalization--  Ambrose FinlandJeannine Johnston Jowan Skillin RN, MSN, CDE Diabetes Coordinator Inpatient Glycemic Control Team Team Pager: [phone] (8a-5p)

## 2016-08-02 LAB — CBC
HCT: 37 % — ABNORMAL LOW (ref 39.0–52.0)
Hemoglobin: 12.6 g/dL — ABNORMAL LOW (ref 13.0–17.0)
MCH: 27.5 pg (ref 26.0–34.0)
MCHC: 34.1 g/dL (ref 30.0–36.0)
MCV: 80.6 fL (ref 78.0–100.0)
PLATELETS: 246 10*3/uL (ref 150–400)
RBC: 4.59 MIL/uL (ref 4.22–5.81)
RDW: 13.9 % (ref 11.5–15.5)
WBC: 4.9 10*3/uL (ref 4.0–10.5)

## 2016-08-02 LAB — BASIC METABOLIC PANEL
Anion gap: 9 (ref 5–15)
BUN: 12 mg/dL (ref 6–20)
CALCIUM: 9 mg/dL (ref 8.9–10.3)
CO2: 25 mmol/L (ref 22–32)
Chloride: 104 mmol/L (ref 101–111)
Creatinine, Ser: 0.56 mg/dL — ABNORMAL LOW (ref 0.61–1.24)
GFR calc Af Amer: >60 mL/min (ref >60)
GLUCOSE: 131 mg/dL — AB (ref 65–99)
POTASSIUM: 3.6 mmol/L (ref 3.5–5.1)
SODIUM: 138 mmol/L (ref 135–145)

## 2016-08-02 LAB — GLUCOSE, CAPILLARY
Glucose-Capillary: 110 mg/dL — ABNORMAL HIGH (ref 65–99)
Glucose-Capillary: 154 mg/dL — ABNORMAL HIGH (ref 65–99)
Glucose-Capillary: 190 mg/dL — ABNORMAL HIGH (ref 65–99)
Glucose-Capillary: 61 mg/dL — ABNORMAL LOW (ref 65–99)

## 2016-08-02 LAB — HEMOGLOBIN A1C
Hgb A1c MFr Bld: 10.4 % — ABNORMAL HIGH (ref 4.8–5.6)
Mean Plasma Glucose: 252 mg/dL

## 2016-08-02 MED ORDER — INSULIN ASPART 100 UNIT/ML ~~LOC~~ SOLN
0.0000 [IU] | Freq: Three times a day (TID) | SUBCUTANEOUS | Status: DC
  Administered 2016-08-02: 2 [IU] via SUBCUTANEOUS
  Administered 2016-08-03: 9 [IU] via SUBCUTANEOUS

## 2016-08-02 MED ORDER — INSULIN GLARGINE 100 UNIT/ML ~~LOC~~ SOLN
38.0000 [IU] | Freq: Every day | SUBCUTANEOUS | Status: DC
  Administered 2016-08-03: 38 [IU] via SUBCUTANEOUS
  Filled 2016-08-02: qty 0.38

## 2016-08-02 MED ORDER — INSULIN ASPART 100 UNIT/ML ~~LOC~~ SOLN
10.0000 [IU] | Freq: Three times a day (TID) | SUBCUTANEOUS | Status: DC
  Administered 2016-08-02 – 2016-08-03 (×2): 10 [IU] via SUBCUTANEOUS

## 2016-08-02 NOTE — Clinical Social Work Note (Addendum)
Clinical Social Work Assessment  Patient Details  Name: Dylan Butler MRN: [id number] Date of Birth: [birth year]  Date of referral:  08/02/16               Reason for consult:  Housing Concerns/Homelessness, Transportation                Permission sought to share information with:    Permission granted to share information::     Name::        Agency::     Relationship::     Contact Information:     Housing/Transportation Living arrangements for the past 2 months:  Single Family Home, No permanent address Source of Information:  Parent Patient Interpreter Needed:  None Criminal Activity/Legal Involvement Pertinent to Current Situation/Hospitalization:  No - Comment as needed Significant Relationships:  Parents Lives with:  Parents Do you feel safe going back to the place where you live?  Yes Need for family participation in patient care:  Yes (Patient Parents are very involved in care)  Care giving concerns:  The mother reports the patient has IDD and history of mental diagnosis.  and parents are his primary caretakers. She reports he has a Neurosurgeon.The patient parents report they recently moved from [place] 07/06/2016 to be closer to family. The patient and family have been living with family members in [place]. The patient parents report they are looking for stable housing in [place]. The patient receives a disability check every month. The patient has insurance and family reports they are able to buy patients medications. They report difficulty with transportation.    Social Worker assessment / plan:  LCSWA met with patient at bedside and provided the patient and family with requested resources for housing and food. Patient father is searching for employment, mom cares for patient throughout the day.  LCSWA provided support and answered family questions about [place] area to best ability.   Employment status:  Disabled (Comment on whether or not currently receiving  Disability) Insurance information:  Medicaid In [place] PT Recommendations:  Not assessed at this time Information / Referral to community resources:  Other (Comment Required) (Income Based Housing List)  Patient/Family's Response to care:  Agreeable and responding well to care.   Patient/Family's Understanding of and Emotional Response to Diagnosis, Current Treatment, and Prognosis:  " We are transitioning from a new place and  trying to learns the services to get him the care he needs."  Emotional Assessment Appearance:  Appears stated age Attitude/Demeanor/Rapport:    Affect (typically observed):  Accepting Orientation:  Oriented to Self, Oriented to Situation, Oriented to Place Alcohol / Substance use:  Not Applicable Psych involvement (Current and /or in the community):  No (Comment)  Discharge Needs  Concerns to be addressed:  Care Coordination Readmission within the last 30 days:  No Current discharge risk:  None Barriers to Discharge:  No Barriers Identified   Lia Hopping, LCSW 08/02/2016, 3:30 PM

## 2016-08-02 NOTE — Progress Notes (Signed)
Patient ID: Dylan Butler, male   DOB: [birth year], 20 y.o.   MRN: [id number]    PROGRESS NOTE    Dylan Butler  WUJ:[id number] DOB: May 21, 1996 DOA: 07/31/2016  PCP: patient is new to [facility] and will need PCP  Brief Narrative:  20 y.o. male with history of diabetes mellitus type 1, ADHD, ODD was brought to the ER after patient's blood sugar was not recordable in the patient's glucometer since it was markedly elevated. Patient also has been having persistent nausea and vomiting.   Assessment & Plan:   Principal Problem:   DKA  - better this AM, AG closed  - has been off insulin drip over 24 hours  - so far tolerating diet well  - diabetic educator consulted, assistance appreciated  - continue with home medical regimen   Active Problems:   Tachycardia - reactive due to the above - resolved     Nausea with vomiting - due to DKA - resolved  DVT prophylaxis: Lovenox Sq Code Status: Full  Family Communication: Patient and mother at bedside  Disposition Plan: Home in am  Consultants:   Diabetic educator   Procedures:   None  Antimicrobials:   None   Subjective: No events overnight.  Objective: Vitals:   08/01/16 1000 08/01/16 1200 08/01/16 2211 08/02/16 0634  BP: 119/62  116/67 (!) 100/59  Pulse: 99   78  Resp: (!) 24   20  Temp:  97.7 F (36.5 C) 98.2 F (36.8 C) 98.2 F (36.8 C)  TempSrc:  Oral Oral Oral  SpO2: 99%  100% 100%  Weight:      Height:        Intake/Output Summary (Last 24 hours) at 08/02/16 1205 Last data filed at 08/01/16 1811  Gross per 24 hour  Intake             1450 ml  Output                0 ml  Net             1450 ml   Filed Weights   07/31/16 1315 07/31/16 2226 08/01/16 0409  Weight: 61.7 kg (136 lb) 51.5 kg (113 lb 8.6 oz) 56.7 kg (125 lb)    Examination:  General exam: Appears calm and comfortable  Respiratory system: Clear to auscultation. Respiratory effort normal. Cardiovascular system: S1 & S2 heard, RRR. No JVD,  murmurs, rubs, gallops or clicks. Gastrointestinal system: Abdomen is nondistended, soft and nontender. No organomegaly or masses felt. Normal bowel sounds heard. Central nervous system: Alert and oriented. No focal neurological deficits. Extremities: Symmetric 5 x 5 power.  Data Reviewed: I have personally reviewed following labs and imaging studies  CBC:  Recent Labs Lab 07/31/16 1508 07/31/16 2326 08/01/16 0332 08/02/16 0605  WBC 6.9 7.5 8.3 4.9  NEUTROABS 4.2  --   --   --   HGB 15.9 13.7 13.1 12.6*  HCT 44.3 40.9 38.8* 37.0*  MCV 78.7 83.3 82.9 80.6  PLT 274 299 283 246   Basic Metabolic Panel:  Recent Labs Lab 07/31/16 1807 07/31/16 2326 08/01/16 0332 08/01/16 0658 08/02/16 0605  NA 138 131* 134*  135 132* 138  K 3.9 4.7 3.7  3.8 4.3 3.6  CL 105 99* 104  105 101 104  CO2 16* 11* 19*  20* 17* 25  GLUCOSE 146* 505* 115*  114* 134* 131*  BUN 18 15 12  13 11 12   CREATININE 0.86 1.06 0.76  0.76  0.77 0.56*  CALCIUM 8.5* 8.8* 8.8*  8.8* 8.7* 9.0   Liver Function Tests:  Recent Labs Lab 07/31/16 1508 07/31/16 1807 08/01/16 0332  AST 25 16 16   ALT 23 17 17   ALKPHOS 154* 117 119  BILITOT 1.5* 1.9* 1.4*  PROT 8.4* 6.5 6.5  ALBUMIN 4.7 3.7 3.7    Recent Labs Lab 07/31/16 1508  LIPASE 13   Cardiac Enzymes:  Recent Labs Lab 07/31/16 2326  TROPONINI <0.03   CBG:  Recent Labs Lab 08/01/16 0957 08/01/16 1053 08/01/16 1649 08/01/16 2204 08/02/16 1131  GLUCAP 129* 152* 353* 244* 190*   Thyroid Function Tests:  Recent Labs  07/31/16 2326  TSH 1.090   Urine analysis:    Component Value Date/Time   COLORURINE YELLOW 07/31/2016 1450   APPEARANCEUR CLEAR 07/31/2016 1450   LABSPEC 1.036 (H) 07/31/2016 1450   PHURINE 5.0 07/31/2016 1450   GLUCOSEU >1000 (A) 07/31/2016 1450   HGBUR NEGATIVE 07/31/2016 1450   BILIRUBINUR MODERATE (A) 07/31/2016 1450   KETONESUR >80 (A) 07/31/2016 1450   PROTEINUR NEGATIVE 07/31/2016 1450   NITRITE  NEGATIVE 07/31/2016 1450   LEUKOCYTESUR NEGATIVE 07/31/2016 1450    Radiology Studies: Dg Chest 2 View  Result Date: 07/31/2016 CLINICAL DATA:  High blood sugar.  Chest pain. EXAM: CHEST  2 VIEW COMPARISON:  None. FINDINGS: The heart size and mediastinal contours are within normal limits. Both lungs are clear. The visualized skeletal structures are unremarkable. IMPRESSION: No active cardiopulmonary disease. Electronically Signed   By: Gerome Samavid  Williams III M.D   On: 07/31/2016 16:05     Scheduled Meds: . enoxaparin (LOVENOX) injection  40 mg Subcutaneous QHS  . guanFACINE  2 mg Oral QHS  . insulin aspart  0-9 Units Subcutaneous TID WC  . insulin glargine  30 Units Subcutaneous Daily  . lisdexamfetamine  60 mg Oral BH-q7a  . loratadine  10 mg Oral Daily  . traZODone  100 mg Oral QHS  . ziprasidone  80 mg Oral BID WC   Continuous Infusions:     LOS: 1 day   Time spent: 20 minutes   Debbora PrestoMAGICK-Brittinee Risk, MD [facility] Pager [phone]  If 7PM-7AM, please contact night-coverage [email or web address] Password [email or web address] 08/02/2016, 12:05 PM

## 2016-08-03 LAB — BASIC METABOLIC PANEL
ANION GAP: 8 (ref 5–15)
BUN: 12 mg/dL (ref 6–20)
CALCIUM: 9.1 mg/dL (ref 8.9–10.3)
CO2: 26 mmol/L (ref 22–32)
CREATININE: 0.41 mg/dL — AB (ref 0.61–1.24)
Chloride: 104 mmol/L (ref 101–111)
GFR calc Af Amer: >60 mL/min (ref >60)
GLUCOSE: 55 mg/dL — AB (ref 65–99)
Potassium: 3.5 mmol/L (ref 3.5–5.1)
Sodium: 138 mmol/L (ref 135–145)

## 2016-08-03 LAB — CBC
HCT: 37.6 % — ABNORMAL LOW (ref 39.0–52.0)
Hemoglobin: 12.7 g/dL — ABNORMAL LOW (ref 13.0–17.0)
MCH: 27.9 pg (ref 26.0–34.0)
MCHC: 33.8 g/dL (ref 30.0–36.0)
MCV: 82.6 fL (ref 78.0–100.0)
PLATELETS: 246 10*3/uL (ref 150–400)
RBC: 4.55 MIL/uL (ref 4.22–5.81)
RDW: 13.9 % (ref 11.5–15.5)
WBC: 5.1 10*3/uL (ref 4.0–10.5)

## 2016-08-03 LAB — GLUCOSE, CAPILLARY
Glucose-Capillary: 148 mg/dL — ABNORMAL HIGH (ref 65–99)
Glucose-Capillary: 355 mg/dL — ABNORMAL HIGH (ref 65–99)
Glucose-Capillary: 427 mg/dL — ABNORMAL HIGH (ref 65–99)
Glucose-Capillary: 435 mg/dL — ABNORMAL HIGH (ref 65–99)
Glucose-Capillary: 72 mg/dL (ref 65–99)

## 2016-08-03 MED ORDER — INSULIN LISPRO 100 UNIT/ML ~~LOC~~ SOLN: 15.0000 [IU] | Freq: Three times a day (TID) | SUBCUTANEOUS | 1 refills | Status: DC

## 2016-08-03 MED ORDER — INSULIN GLARGINE 100 UNIT/ML ~~LOC~~ SOLN: 38.0000 [IU] | Freq: Every day | SUBCUTANEOUS | 1 refills | Status: DC

## 2016-08-03 MED ORDER — ONDANSETRON 4 MG PO TBDP: 4.0000 mg | ORAL_TABLET | Freq: Three times a day (TID) | ORAL | 0 refills | Status: DC | PRN

## 2016-08-03 MED ORDER — BLOOD GLUCOSE MONITOR KIT: PACK | 0 refills | Status: AC

## 2016-08-03 MED ORDER — TRAZODONE HCL 100 MG PO TABS: 100.0000 mg | ORAL_TABLET | Freq: Every day | ORAL | 0 refills | Status: DC

## 2016-08-03 MED ORDER — GUANFACINE HCL 2 MG PO TABS: 2.0000 mg | ORAL_TABLET | Freq: Every day | ORAL | 0 refills | Status: DC

## 2016-08-03 NOTE — Progress Notes (Signed)
Pt discharged from the unit. Discharge instructions and prescriptions were given to the pt prior to discharge. MD was made aware of CBG in the high 300's. MD is okay with pt being discharged.  Lettie Czarnecki W Markesia Crilly, RN

## 2016-08-03 NOTE — Discharge Summary (Signed)
Physician Discharge Summary  Alfredia FergusonKaleb Gilliam NGE:[id number] DOB: 02/14/1996 DOA: 07/31/2016  PCP: No primary care provider on file.  Admit date: 07/31/2016 Discharge date: 08/03/2016  Recommendations for Outpatient Follow-up:  1. Pt will need to follow up with PCP in 2-3 weeks post discharge  Discharge Diagnoses:  Principal Problem:   Ketoacidosis Active Problems:   Tachycardia   Controlled type 1 diabetes mellitus with hyperglycemia (HCC)   Nausea with vomiting   DKA, type 1 (HCC)  Discharge Condition: Stable  Diet recommendation: Heart healthy diet discussed in details   Brief Narrative:  20 y.o.malewith history of diabetes mellitus type 1, ADHD, ODD was brought to the ER after patient's blood sugar was not recordable in the patient's glucometer since it was markedly elevated. Patient also has been having persistent nausea and vomiting.   Assessment & Plan:   Principal Problem:   DKA  - better this AM, AG closed  - has been off insulin drip over 48 hours  - so far tolerating diet well  - diabetic educator consulted, assistance appreciated  - continue with home medical regimen, scripts provided - pt given phone number for endocrinologist to call and schedule and appointment   Active Problems:   Tachycardia - reactive due to the above - resolved     Nausea with vomiting - due to DKA - resolved  DVT prophylaxis: Lovenox Sq Code Status: Full  Family Communication: Patient and mother at bedside  Disposition Plan: Home   Consultants:   Diabetic educator   Procedures:   None  Antimicrobials:   None   Procedures/Studies: Dg Chest 2 View  Result Date: 07/31/2016 CLINICAL DATA:  High blood sugar.  Chest pain. EXAM: CHEST  2 VIEW COMPARISON:  None. FINDINGS: The heart size and mediastinal contours are within normal limits. Both lungs are clear. The visualized skeletal structures are unremarkable. IMPRESSION: No active cardiopulmonary disease. Electronically  Signed   By: Gerome Samavid  Williams III M.D   On: 07/31/2016 16:05     Discharge Exam: Vitals:   08/02/16 2132 08/03/16 0611  BP: 114/67 104/63  Pulse: 88 75  Resp: 19 18  Temp: 98.6 F (37 C) 98.1 F (36.7 C)   Vitals:   08/02/16 0634 08/02/16 1454 08/02/16 2132 08/03/16 0611  BP: (!) 100/59 128/66 114/67 104/63  Pulse: 78 99 88 75  Resp: 20 20 19 18   Temp: 98.2 F (36.8 C) 98.1 F (36.7 C) 98.6 F (37 C) 98.1 F (36.7 C)  TempSrc: Oral Oral Oral Oral  SpO2: 100% 99% 100% 94%  Weight:      Height:        General: Pt is alert, follows commands appropriately, not in acute distress Cardiovascular: Regular rate and rhythm, S1/S2 +, no murmurs, no rubs, no gallops Respiratory: Clear to auscultation bilaterally, no wheezing, no crackles, no rhonchi Abdominal: Soft, non tender, non distended, bowel sounds +, no guarding Extremities: no edema, no cyanosis, pulses palpable bilaterally DP and PT Neuro: Grossly nonfocal  Discharge Instructions  Discharge Instructions    Diet - low sodium heart healthy    Complete by:  As directed    Increase activity slowly    Complete by:  As directed        Medication List    TAKE these medications   acetaminophen 500 MG tablet Commonly known as:  TYLENOL Take 500 mg by mouth every 6 (six) hours as needed for headache.   cetirizine 10 MG tablet Commonly known as:  ZYRTEC Take  10 mg by mouth daily.   guanFACINE 2 MG tablet Commonly known as:  TENEX Take 1 tablet (2 mg total) by mouth at bedtime.   insulin glargine 100 UNIT/ML injection Commonly known as:  LANTUS Inject 0.38 mLs (38 Units total) into the skin at bedtime.   insulin lispro 100 UNIT/ML injection Commonly known as:  HUMALOG Inject 0.15-0.25 mLs (15-25 Units total) into the skin 3 (three) times daily before meals.   lisdexamfetamine 60 MG capsule Commonly known as:  VYVANSE Take 60 mg by mouth every morning.   ondansetron 4 MG disintegrating tablet Commonly known  as:  ZOFRAN-ODT Take 1 tablet (4 mg total) by mouth every 8 (eight) hours as needed for nausea or vomiting.   traZODone 100 MG tablet Commonly known as:  DESYREL Take 1 tablet (100 mg total) by mouth at bedtime.   ziprasidone 80 MG capsule Commonly known as:  GEODON Take 80 mg by mouth 2 (two) times daily with a meal.      Follow-up Information    Debbora Presto, MD .   Specialty:  Internal Medicine Why:  call me with questions [phone] Contact information: [address] [place] [address] [phone]        Talmage Coin, MD. Schedule an appointment as soon as possible for a visit today.   Specialty:  Endocrinology Contact information: [address] [place] [address] [phone]            The results of significant diagnostics from this hospitalization (including imaging, microbiology, ancillary and laboratory) are listed below for reference.     Microbiology: No results found for this or any previous visit (from the past 240 hour(s)).   Labs: Basic Metabolic Panel:  Recent Labs Lab 07/31/16 2326 08/01/16 0332 08/01/16 0658 08/02/16 0605 08/03/16 0602  NA 131* 134*  135 132* 138 138  K 4.7 3.7  3.8 4.3 3.6 3.5  CL 99* 104  105 101 104 104  CO2 11* 19*  20* 17* 25 26  GLUCOSE 505* 115*  114* 134* 131* 55*  BUN 15 12  13 11 12 12   CREATININE 1.06 0.76  0.76 0.77 0.56* 0.41*  CALCIUM 8.8* 8.8*  8.8* 8.7* 9.0 9.1   Liver Function Tests:  Recent Labs Lab 07/31/16 1508 07/31/16 1807 08/01/16 0332  AST 25 16 16   ALT 23 17 17   ALKPHOS 154* 117 119  BILITOT 1.5* 1.9* 1.4*  PROT 8.4* 6.5 6.5  ALBUMIN 4.7 3.7 3.7    Recent Labs Lab 07/31/16 1508  LIPASE 13   No results for input(s): AMMONIA in the last 168 hours. CBC:  Recent Labs Lab 07/31/16 1508 07/31/16 2326 08/01/16 0332 08/02/16 0605 08/03/16 0602  WBC 6.9 7.5 8.3 4.9 5.1  NEUTROABS 4.2  --   --   --   --   HGB 15.9  13.7 13.1 12.6* 12.7*  HCT 44.3 40.9 38.8* 37.0* 37.6*  MCV 78.7 83.3 82.9 80.6 82.6  PLT 274 299 283 246 246   Cardiac Enzymes:  Recent Labs Lab 07/31/16 2326  TROPONINI <0.03   CBG:  Recent Labs Lab 08/02/16 1609 08/02/16 2130 08/02/16 2335 08/03/16 0010 08/03/16 0730  GLUCAP 154* 110* 61* 148* 72   SIGNED: Time coordinating discharge: 30 minutes  MAGICK-MYERS, ISKRA, MD  [facility] 08/03/2016, 11:00 AM Pager [phone]  If 7PM-7AM, please contact night-coverage [email or web address] Password [email or web address]

## 2016-08-03 NOTE — Discharge Instructions (Signed)
Diabetic Ketoacidosis °Diabetic ketoacidosis is a life-threatening complication of diabetes. If it is not treated, it can cause severe dehydration and organ damage and can lead to a coma or death. °CAUSES °This condition develops when there is not enough of the hormone insulin in the body. Insulin helps the body to break down sugar for energy. Without insulin, the body cannot break down sugar, so it breaks down fats instead. This leads to the production of acids that are called ketones. Ketones are poisonous at high levels. °This condition can be triggered by: °· Stress on the body that is brought on by an illness. °· Medicines that raise blood glucose levels. °· Not taking diabetes medicine. °SYMPTOMS °Symptoms of this condition include: °· Fatigue. °· Weight loss. °· Excessive thirst. °· Light-headedness. °· Fruity or sweet-smelling breath. °· Excessive urination. °· Vision changes. °· Confusion or irritability. °· Nausea. °· Vomiting. °· Rapid breathing. °· Abdominal pain. °· Feeling flushed. °DIAGNOSIS °This condition is diagnosed based on a medical history, a physical exam, and blood tests. You may also have a urine test that checks for ketones. °TREATMENT °This condition may be treated with: °· Fluid replacement. This may be done to correct dehydration. °· Insulin injections. These may be given through the skin or through an IV tube. °· Electrolyte replacement. Electrolytes, such as potassium and sodium, may be given in pill form or through an IV tube. °· Antibiotic medicines. These may be prescribed if your condition was caused by an infection. °HOME CARE INSTRUCTIONS °Eating and Drinking °· Drink enough fluids to keep your urine clear or pale yellow. °· If you cannot eat, alternate between drinking fluids with sugar (such as juice) and salty fluids (such as broth or bouillon). °· If you can eat, follow your usual diet and drink sugar-free liquids, such as water. °Other Instructions °· Take insulin as  directed by your health care provider. Do not skip insulin injections. Do not use expired insulin. °· If your blood sugar is over 240 mg/dL, monitor your urine ketones every 4-6 hours. °· If you were prescribed an antibiotic medicine, finish all of it even if you start to feel better. °· Rest and exercise only as directed by your health care provider. °· If you get sick, call your health care provider and begin treatment quickly. Your body often needs extra insulin to fight an illness. °· Check your blood glucose levels regularly. If your blood glucose is high, drink plenty of fluids. This helps to flush out ketones. °SEEK MEDICAL CARE IF: °· Your blood glucose level is too high or too low. °· You have ketones in your urine. °· You have a fever. °· You cannot eat. °· You cannot tolerate fluids. °· You have been vomiting for more than 2 hours. °· You continue to have symptoms of this condition. °· You develop new symptoms. °SEEK IMMEDIATE MEDICAL CARE IF: °· Your blood glucose levels continue to be high (elevated). °· Your monitor reads "high" even when you are taking insulin. °· You faint. °· You have chest pain. °· You have trouble breathing. °· You have a sudden, severe headache. °· You have sudden weakness in one arm or one leg. °· You have sudden trouble speaking or swallowing. °· You have vomiting or diarrhea that gets worse after 3 hours. °· You feel severely fatigued. °· You have trouble thinking. °· You have abdominal pain. °· You are severely dehydrated. Symptoms of severe dehydration include: °¨ Extreme thirst. °¨ Dry mouth. °¨ Blue lips. °¨   Cold hands and feet. °¨ Rapid breathing. °  °This information is not intended to replace advice given to you by your health care provider. Make sure you discuss any questions you have with your health care provider. °  °Document Released: 10/19/2000 Document Revised: 03/08/2015 Document Reviewed: 09/29/2014 °Elsevier Interactive Patient Education ©2016 [employer]  Inc[employer] ° °

## 2016-11-12 ENCOUNTER — Ambulatory Visit: Payer: Medicaid Other | Admitting: Internal Medicine

## 2016-11-26 ENCOUNTER — Encounter (HOSPITAL_COMMUNITY): Payer: Self-pay | Admitting: *Deleted

## 2016-11-26 ENCOUNTER — Observation Stay (HOSPITAL_COMMUNITY)
Admission: EM | Admit: 2016-11-26 | Discharge: 2016-11-27 | Disposition: A | Discharge status: Home / Self Care | Payer: Medicaid Other | Attending: Family Medicine | Admitting: Family Medicine

## 2016-11-26 ENCOUNTER — Emergency Department (HOSPITAL_COMMUNITY): Payer: Medicaid Other

## 2016-11-26 DIAGNOSIS — F909 Attention-deficit hyperactivity disorder, unspecified type: Secondary | ICD-10-CM | POA: Insufficient documentation

## 2016-11-26 DIAGNOSIS — R1013 Epigastric pain: Secondary | ICD-10-CM | POA: Diagnosis present

## 2016-11-26 DIAGNOSIS — Z7951 Long term (current) use of inhaled steroids: Secondary | ICD-10-CM | POA: Diagnosis not present

## 2016-11-26 DIAGNOSIS — E1010 Type 1 diabetes mellitus with ketoacidosis without coma: Secondary | ICD-10-CM | POA: Diagnosis not present

## 2016-11-26 DIAGNOSIS — E108 Type 1 diabetes mellitus with unspecified complications: Secondary | ICD-10-CM

## 2016-11-26 DIAGNOSIS — F419 Anxiety disorder, unspecified: Secondary | ICD-10-CM | POA: Insufficient documentation

## 2016-11-26 DIAGNOSIS — F319 Bipolar disorder, unspecified: Secondary | ICD-10-CM | POA: Diagnosis not present

## 2016-11-26 DIAGNOSIS — Z79899 Other long term (current) drug therapy: Secondary | ICD-10-CM | POA: Diagnosis not present

## 2016-11-26 DIAGNOSIS — E1110 Type 2 diabetes mellitus with ketoacidosis without coma: Secondary | ICD-10-CM | POA: Diagnosis present

## 2016-11-26 DIAGNOSIS — E1310 Other specified diabetes mellitus with ketoacidosis without coma: Secondary | ICD-10-CM | POA: Diagnosis not present

## 2016-11-26 DIAGNOSIS — Z794 Long term (current) use of insulin: Secondary | ICD-10-CM | POA: Insufficient documentation

## 2016-11-26 LAB — CBC
HCT: 37.8 % — ABNORMAL LOW (ref 39.0–52.0)
HEMATOCRIT: 43.8 % (ref 39.0–52.0)
Hemoglobin: 15.1 g/dL (ref 13.0–17.0)
Hemoglobin: 12.8 g/dL — ABNORMAL LOW (ref 13.0–17.0)
MCH: 27.8 pg (ref 26.0–34.0)
MCH: 27.2 pg (ref 26.0–34.0)
MCHC: 34.5 g/dL (ref 30.0–36.0)
MCHC: 33.9 g/dL (ref 30.0–36.0)
MCV: 80.7 fL (ref 78.0–100.0)
MCV: 80.3 fL (ref 78.0–100.0)
PLATELETS: 235 10*3/uL (ref 150–400)
PLATELETS: 231 10*3/uL (ref 150–400)
RBC: 5.43 MIL/uL (ref 4.22–5.81)
RBC: 4.71 MIL/uL (ref 4.22–5.81)
RDW: 12.1 % (ref 11.5–15.5)
RDW: 12 % (ref 11.5–15.5)
WBC: 17 10*3/uL — AB (ref 4.0–10.5)
WBC: 10.9 10*3/uL — ABNORMAL HIGH (ref 4.0–10.5)

## 2016-11-26 LAB — BLOOD GAS, VENOUS
ACID-BASE DEFICIT: 7.1 mmol/L — AB (ref 0.0–2.0)
BICARBONATE: 18.9 mmol/L — AB (ref 20.0–28.0)
O2 SAT: 76.4 %
PCO2 VEN: 41.3 mmHg — AB (ref 44.0–60.0)
PO2 VEN: 45.4 mmHg — AB (ref 32.0–45.0)
Patient temperature: 98.6
pH, Ven: 7.283 (ref 7.250–7.430)

## 2016-11-26 LAB — URINALYSIS, ROUTINE W REFLEX MICROSCOPIC
BILIRUBIN URINE: NEGATIVE
Bacteria, UA: NONE SEEN
HGB URINE DIPSTICK: NEGATIVE
Ketones, ur: 80 mg/dL — AB
LEUKOCYTES UA: NEGATIVE
NITRITE: NEGATIVE
Protein, ur: NEGATIVE mg/dL
RBC / HPF: NONE SEEN RBC/hpf (ref 0–5)
SQUAMOUS EPITHELIAL / LPF: NONE SEEN
Specific Gravity, Urine: 1.03 (ref 1.005–1.030)
pH: 5 (ref 5.0–8.0)

## 2016-11-26 LAB — BASIC METABOLIC PANEL
Anion gap: 18 — ABNORMAL HIGH (ref 5–15)
Anion gap: 10 (ref 5–15)
BUN: 25 mg/dL — ABNORMAL HIGH (ref 6–20)
BUN: 17 mg/dL (ref 6–20)
CALCIUM: 8.2 mg/dL — AB (ref 8.9–10.3)
CO2: 18 mmol/L — ABNORMAL LOW (ref 22–32)
CO2: 22 mmol/L (ref 22–32)
CREATININE: 0.94 mg/dL (ref 0.61–1.24)
Calcium: 9.8 mg/dL (ref 8.9–10.3)
Chloride: 95 mmol/L — ABNORMAL LOW (ref 101–111)
Chloride: 100 mmol/L — ABNORMAL LOW (ref 101–111)
Creatinine, Ser: 0.97 mg/dL (ref 0.61–1.24)
GFR calc Af Amer: >60 mL/min (ref >60)
GLUCOSE: 274 mg/dL — AB (ref 65–99)
Glucose, Bld: 285 mg/dL — ABNORMAL HIGH (ref 65–99)
POTASSIUM: 4.2 mmol/L (ref 3.5–5.1)
POTASSIUM: 4 mmol/L (ref 3.5–5.1)
SODIUM: 131 mmol/L — AB (ref 135–145)
SODIUM: 132 mmol/L — AB (ref 135–145)

## 2016-11-26 LAB — CBG MONITORING, ED
GLUCOSE-CAPILLARY: 329 mg/dL — AB (ref 65–99)
Glucose-Capillary: 165 mg/dL — ABNORMAL HIGH (ref 65–99)

## 2016-11-26 LAB — GLUCOSE, CAPILLARY
GLUCOSE-CAPILLARY: 147 mg/dL — AB (ref 65–99)
Glucose-Capillary: 238 mg/dL — ABNORMAL HIGH (ref 65–99)

## 2016-11-26 MED ORDER — DEXTROSE 50 % IV SOLN: 25.0000 mL | INTRAVENOUS | Status: DC | PRN

## 2016-11-26 MED ORDER — INSULIN REGULAR BOLUS VIA INFUSION
0.0000 [IU] | Freq: Three times a day (TID) | INTRAVENOUS | Status: DC
  Filled 2016-11-26: qty 10

## 2016-11-26 MED ORDER — INSULIN ASPART 100 UNIT/ML ~~LOC~~ SOLN
0.0000 [IU] | SUBCUTANEOUS | Status: DC
  Administered 2016-11-26: 1 [IU] via SUBCUTANEOUS
  Administered 2016-11-26: 3 [IU] via SUBCUTANEOUS
  Administered 2016-11-27: 1 [IU] via SUBCUTANEOUS
  Administered 2016-11-27: 2 [IU] via SUBCUTANEOUS
  Administered 2016-11-27: 3 [IU] via SUBCUTANEOUS

## 2016-11-26 MED ORDER — SODIUM CHLORIDE 0.9 % IV SOLN
INTRAVENOUS | Status: DC
  Administered 2016-11-26 – 2016-11-27 (×2): via INTRAVENOUS

## 2016-11-26 MED ORDER — ATOMOXETINE HCL 60 MG PO CAPS
60.0000 mg | ORAL_CAPSULE | Freq: Every day | ORAL | Status: DC
  Administered 2016-11-26 – 2016-11-27 (×2): 60 mg via ORAL
  Filled 2016-11-26 (×2): qty 1

## 2016-11-26 MED ORDER — INSULIN GLARGINE 100 UNIT/ML ~~LOC~~ SOLN
40.0000 [IU] | Freq: Every day | SUBCUTANEOUS | Status: DC
  Administered 2016-11-27: 40 [IU] via SUBCUTANEOUS
  Filled 2016-11-26: qty 0.4

## 2016-11-26 MED ORDER — LISDEXAMFETAMINE DIMESYLATE 50 MG PO CAPS
50.0000 mg | ORAL_CAPSULE | Freq: Every day | ORAL | Status: DC
  Administered 2016-11-27: 50 mg via ORAL
  Filled 2016-11-26: qty 1

## 2016-11-26 MED ORDER — ACETAMINOPHEN 650 MG RE SUPP: 650.0000 mg | Freq: Four times a day (QID) | RECTAL | Status: DC | PRN

## 2016-11-26 MED ORDER — SODIUM CHLORIDE 0.9 % IV BOLUS (SEPSIS)
1000.0000 mL | Freq: Once | INTRAVENOUS | Status: AC
  Administered 2016-11-26: 1000 mL via INTRAVENOUS

## 2016-11-26 MED ORDER — TRAZODONE HCL 50 MG PO TABS
150.0000 mg | ORAL_TABLET | Freq: Every day | ORAL | Status: DC
  Administered 2016-11-26: 150 mg via ORAL
  Filled 2016-11-26: qty 1

## 2016-11-26 MED ORDER — ONDANSETRON HCL 4 MG/2ML IJ SOLN: 4.0000 mg | Freq: Four times a day (QID) | INTRAMUSCULAR | Status: DC | PRN

## 2016-11-26 MED ORDER — ONDANSETRON HCL 4 MG PO TABS: 4.0000 mg | ORAL_TABLET | Freq: Four times a day (QID) | ORAL | Status: DC | PRN

## 2016-11-26 MED ORDER — SODIUM CHLORIDE 0.9 % IV SOLN: INTRAVENOUS | Status: DC

## 2016-11-26 MED ORDER — ENOXAPARIN SODIUM 40 MG/0.4ML ~~LOC~~ SOLN
40.0000 mg | SUBCUTANEOUS | Status: DC
  Administered 2016-11-26: 40 mg via SUBCUTANEOUS
  Filled 2016-11-26: qty 0.4

## 2016-11-26 MED ORDER — INSULIN GLARGINE 100 UNIT/ML ~~LOC~~ SOLN
20.0000 [IU] | Freq: Once | SUBCUTANEOUS | Status: AC
Start: 2016-11-26 — End: 2016-11-26
  Administered 2016-11-26: 20 [IU] via SUBCUTANEOUS
  Filled 2016-11-26: qty 0.2

## 2016-11-26 MED ORDER — DEXTROSE-NACL 5-0.45 % IV SOLN: INTRAVENOUS | Status: DC

## 2016-11-26 MED ORDER — FLUTICASONE PROPIONATE 50 MCG/ACT NA SUSP
2.0000 | Freq: Every day | NASAL | Status: DC
  Administered 2016-11-26 – 2016-11-27 (×2): 2 via NASAL
  Filled 2016-11-26: qty 16

## 2016-11-26 MED ORDER — LORATADINE 10 MG PO TABS
10.0000 mg | ORAL_TABLET | Freq: Every day | ORAL | Status: DC
  Administered 2016-11-27: 10 mg via ORAL
  Filled 2016-11-26: qty 1

## 2016-11-26 MED ORDER — GUANFACINE HCL 2 MG PO TABS
4.0000 mg | ORAL_TABLET | Freq: Every day | ORAL | Status: DC
  Administered 2016-11-26: 4 mg via ORAL
  Filled 2016-11-26: qty 2

## 2016-11-26 MED ORDER — ZIPRASIDONE HCL 20 MG PO CAPS
160.0000 mg | ORAL_CAPSULE | Freq: Every day | ORAL | Status: DC
  Administered 2016-11-26: 160 mg via ORAL
  Filled 2016-11-26: qty 8

## 2016-11-26 MED ORDER — ACETAMINOPHEN 325 MG PO TABS
650.0000 mg | ORAL_TABLET | Freq: Four times a day (QID) | ORAL | Status: DC | PRN
Start: 2016-11-26 — End: 2016-11-27

## 2016-11-26 MED ORDER — SODIUM CHLORIDE 0.9 % IV SOLN
INTRAVENOUS | Status: DC
  Filled 2016-11-26: qty 2.5

## 2016-11-26 NOTE — ED Provider Notes (Signed)
Complains of vomiting 5-10 episodes since yesterday a copy by epigastric pain. This also had a slight cough recently. On exam he is alert and in no distress lungs clear auscultation abdomen nondistended nontender heart regular rate and rhythm. Patient noted to be in diabetic ketoacidosis. X-rays reviewed by me   Doug SouSam Kamau Weatherall, MD 11/26/16 1416

## 2016-11-26 NOTE — ED Triage Notes (Signed)
Per EMS: Pt from hotel.  Pt was altered yesterday.  Tried to use insulin to bring his CBG down.  Was up in the 500s today.  Uses humalog and lantus.  Was supposed to take 40 units but got 20 because they ran out.  BIB mom.  Pt appears disheveled.  Pt has MR.

## 2016-11-26 NOTE — Progress Notes (Signed)
Entered in d/c instructions Medicaid [place] access response hx indicates the assigned pcp is Dylan Butler [facility]     Medicaid [place] access response hx indicates the assigned pcp is Dylan Butler [facility] [address] [place] [place], [place] [id number] [phone]    Next Steps: Schedule an appointment as soon as possible for a visit    Instructions: This is your assigned Medicaid [place] access doctor If you prefer to see another Medicaid doctor other than the one on your Medicaid card PLEASE CALL DSS [phone] or [phone]     As a Medicaid client you MUST contact DSS/SSI each time you change address,move to another [place] county or another state to keep your address updated     [place]: [phone] [address] [place], [place] [id number] [email or web address] Use this website to assist with finding a Medicaid doctor, understanding your coverage & to renew application    Next Steps: Follow up

## 2016-11-26 NOTE — ED Provider Notes (Signed)
[facility] DEPT Provider Note   CSN: [id number] Arrival date & time: 11/26/16  1130     History   Chief Complaint Chief Complaint  Patient presents with  . Hyperglycemia    HPI Dylan Butler is a 21 y.o. male.  The history is provided by the patient and medical records.  Hyperglycemia   Level V Caveat:  MR 21 y.o. M with hx of ADHD, anxiety, bipolar disorder, DM1, ODD, mild MR presenting to the ED presenting to the ED for hyperglycemia.  History is provided by patient's mom who is at bedside.  Patient's sugar has been elevated over the past 2-3 days.  States today was the highest in the 500 range.  Only gave half dose (20 units) lantus last night because they ran out.  She gave him a dose of humalog this morning as well, but has not re-checked sugar since this time.  Reports patient did have some nausea/vomiting this morning as well.  States she put him in the bath because this often helps with his sickness.  States he tried to get out himself and slipped and fell on to the tile floor, impact on right side of his body.  Reports hand pain, right rib pain, and hip pain.  Mom reports he did guard his head during the fall, no LOC.  Has been ambulatory since fall but guardian reports he is limping.  Patient is at his neurologic baseline per mom.  Family currently staying in hotel, here from [place].  Past Medical History:  Diagnosis Date  . ADHD (attention deficit hyperactivity disorder)   . Anxiety   . Bipolar 1 disorder ([place])   . Diabetes mellitus without complication ([place])   . ODD (oppositional defiant disorder)     Patient Active Problem List   Diagnosis Date Noted  . Tachycardia 07/31/2016  . Ketoacidosis 07/31/2016  . Controlled type 1 diabetes mellitus with hyperglycemia ([place]) 07/31/2016  . Nausea with vomiting 07/31/2016  . DKA, type 1 ([place]) 07/31/2016    Past Surgical History:  Procedure Laterality Date  . HERNIA REPAIR         Home Medications    Prior to  Admission medications   Medication Sig Start Date End Date Taking? Authorizing Provider  acetaminophen (TYLENOL) 500 MG tablet Take 500 mg by mouth every 6 (six) hours as needed for headache.   Yes Historical Provider, MD  atomoxetine (STRATTERA) 60 MG capsule Take 60 mg by mouth daily.   Yes Historical Provider, MD  blood glucose meter kit and supplies KIT Dispense based on patient and insurance preference. Use up to four times daily as directed. (FOR ICD-9 250.00, 250.01). 08/03/16  Yes Theodis Blaze, MD  cetirizine (ZYRTEC) 10 MG tablet Take 10 mg by mouth daily.   Yes Historical Provider, MD  fluticasone (FLONASE) 50 MCG/ACT nasal spray Place 2 sprays into both nostrils daily.   Yes Historical Provider, MD  guanFACINE (TENEX) 2 MG tablet Take 1 tablet (2 mg total) by mouth at bedtime. Patient taking differently: Take 4 mg by mouth at bedtime.  08/03/16  Yes Theodis Blaze, MD  insulin glargine (LANTUS) 100 UNIT/ML injection Inject 0.38 mLs (38 Units total) into the skin at bedtime. Patient taking differently: Inject 40 Units into the skin daily.  08/03/16  Yes Theodis Blaze, MD  insulin lispro (HUMALOG) 100 UNIT/ML injection Inject 0.15-0.25 mLs (15-25 Units total) into the skin 3 (three) times daily before meals. Patient taking differently: Inject 15-25 Units into the  skin 3 (three) times daily before meals. Take 15 units at breakfast and lunch and then 20 units at dinner, add an additional 5 units per starch/carb calculations 08/03/16  Yes Theodis Blaze, MD  lisdexamfetamine (VYVANSE) 50 MG capsule Take 50 mg by mouth daily.   Yes Historical Provider, MD  ondansetron (ZOFRAN-ODT) 4 MG disintegrating tablet Take 1 tablet (4 mg total) by mouth every 8 (eight) hours as needed for nausea or vomiting. 08/03/16  Yes Theodis Blaze, MD  traZODone (DESYREL) 150 MG tablet Take 150 mg by mouth at bedtime.   Yes Historical Provider, MD  ziprasidone (GEODON) 80 MG capsule Take 160 mg by mouth at bedtime.    Yes  Historical Provider, MD  traZODone (DESYREL) 100 MG tablet Take 1 tablet (100 mg total) by mouth at bedtime. Patient not taking: Reported on 11/26/2016 08/03/16   Theodis Blaze, MD    Family History Family History  Problem Relation Age of Onset  . Diabetes Mellitus II Maternal Grandmother   . CAD Maternal Grandmother     Social History Social History  Substance Use Topics  . Smoking status: Never Smoker  . Smokeless tobacco: Never Used  . Alcohol use No     Allergies   Other   Review of Systems Review of Systems  Unable to perform ROS: Other     Physical Exam Updated Vital Signs BP 121/60 (BP Location: Left Arm)   Pulse 80   Temp 97.7 F (36.5 C) (Oral)   Resp 18   SpO2 97%   Physical Exam  Constitutional: He appears well-developed and well-nourished.  HENT:  Head: Normocephalic and atraumatic.  Mouth/Throat: Oropharynx is clear and moist.  No visible signs of head trauma  Eyes: Conjunctivae and EOM are normal. Pupils are equal, round, and reactive to light.  Neck: Normal range of motion.  Cardiovascular: Normal rate, regular rhythm and normal heart sounds.   Pulmonary/Chest: Effort normal and breath sounds normal.  No bruising or deformity of the chest wall; lungs clear, no distress, speaking in full sentences without distress  Abdominal: Soft. Bowel sounds are normal.  Musculoskeletal: Normal range of motion.  No gross deformities of the right arm or leg; no leg shortening, no apparent pain with ROM, using right arm to grab objects such as teddy bears in the bed with him Small amount of bruising over the right 3rd MCP joint with mild swelling, no apparent difficulty with ROM  Neurological: He is alert.  Awake, alert, seemingly aware of surroundings, appropriate interactions during exam  Skin: Skin is warm and dry.  Psychiatric: He has a normal mood and affect.  Nursing note and vitals reviewed.    ED Treatments / Results  Labs (all labs ordered are  listed, but only abnormal results are displayed) Labs Reviewed  BASIC METABOLIC PANEL - Abnormal; Notable for the following:       Result Value   Sodium 131 (*)    Chloride 95 (*)    CO2 18 (*)    Glucose, Bld 285 (*)    BUN 25 (*)    Anion gap 18 (*)    All other components within normal limits  CBC - Abnormal; Notable for the following:    WBC 17.0 (*)    All other components within normal limits  URINALYSIS, ROUTINE W REFLEX MICROSCOPIC - Abnormal; Notable for the following:    Glucose, UA >=500 (*)    Ketones, ur 80 (*)    All other  components within normal limits  BLOOD GAS, VENOUS - Abnormal; Notable for the following:    pCO2, Ven 41.3 (*)    pO2, Ven 45.4 (*)    Bicarbonate 18.9 (*)    Acid-base deficit 7.1 (*)    All other components within normal limits  CBG MONITORING, ED - Abnormal; Notable for the following:    Glucose-Capillary 329 (*)    All other components within normal limits    EKG  EKG Interpretation None       Radiology Dg Ribs Unilateral W/chest Right  Result Date: 11/26/2016 CLINICAL DATA:  Right rib pain after falling out of the bath tub this morning EXAM: RIGHT RIBS AND CHEST - 3+ VIEW COMPARISON:  Chest x-ray dated 07/31/2016 FINDINGS: No fracture or other bone lesions are seen involving the ribs. There is no evidence of pneumothorax or pleural effusion. Both lungs are clear. Heart size and mediastinal contours are within normal limits. IMPRESSION: Negative. Electronically Signed   By: Lorriane Shire M.D.   On: 11/26/2016 12:57   Dg Hand Complete Right  Result Date: 11/26/2016 CLINICAL DATA:  Right hand pain after falling out of the bath tub this morning. EXAM: RIGHT HAND - COMPLETE 3+ VIEW COMPARISON:  None. FINDINGS: There is no evidence of fracture or dislocation. There is no evidence of arthropathy or other focal bone abnormality. Soft tissues are unremarkable. IMPRESSION: Negative. Electronically Signed   By: Lorriane Shire M.D.   On:  11/26/2016 12:54   Dg Hip Unilat W Or Wo Pelvis 2-3 Views Right  Result Date: 11/26/2016 CLINICAL DATA:  Right hip pain after falling out of the bath tub this morning. EXAM: DG HIP (WITH OR WITHOUT PELVIS) 2-3V RIGHT COMPARISON:  None. FINDINGS: There is no evidence of hip fracture or dislocation. There is no evidence of arthropathy or other focal bone abnormality. IMPRESSION: Negative. Electronically Signed   By: Lorriane Shire M.D.   On: 11/26/2016 12:55    Procedures Procedures (including critical care time)   Medications  insulin glargine (LANTUS) injection 20 Units (not administered)  sodium chloride 0.9 % bolus 1,000 mL (0 mLs Intravenous Stopped 11/26/16 1417)  sodium chloride 0.9 % bolus 1,000 mL (1,000 mLs Intravenous New Bag/Given 11/26/16 1417)    Medications Ordered in ED Medications  insulin glargine (LANTUS) injection 20 Units (not administered)  sodium chloride 0.9 % bolus 1,000 mL (0 mLs Intravenous Stopped 11/26/16 1417)  sodium chloride 0.9 % bolus 1,000 mL (1,000 mLs Intravenous New Bag/Given 11/26/16 1417)     Initial Impression / Assessment and Plan / ED Course  I have reviewed the triage vital signs and the nursing notes.  Pertinent labs & imaging results that were available during my care of the patient were reviewed by me and considered in my medical decision making (see chart for details).  21 y.o. M here with hyperglycemia.  CBG 500's this morning, 329 after dose of humalog this morning.  Also had fall out of bathtub onto right side, no head injury or LOC.  Complaints of right hand pain, right rib pain, and right hip pain.  No deformities noted on exam.  At neurologic baseline per mom.  Will plan for labs, x-rays, IVF started.  Labs with evidence of DKA-- elevated anion gap at 18, bicarb low at 18, >80 ketones in urine.  X-rays without acute bony fractures.  Patient given 2L IVFB, will start glucostabilizer.  Will admit for ongoing care.  Discussed with  hospitalist-- recommends hold on insulin  drip, instead order 20 units lantus, can have clear liquid diet for now.  Tele/obs admit, will get SW to see in the morning for possible medication assistance.  Final Clinical Impressions(s) / ED Diagnoses   Final diagnoses:  Type 1 diabetes mellitus with ketoacidosis without coma [facility])    New Prescriptions New Prescriptions   No medications on file     Larene Pickett, Hershal Coria 11/26/16 [place], MD 11/26/16 [phone]

## 2016-11-26 NOTE — H&P (Signed)
History and Physical    Dylan Butler WEX:[id number] DOB: 01-18-1996 DOA: 11/26/2016  PCP: PROVIDER NOT IN SYSTEM   Patient coming from: Home  Chief Complaint: Abdominal pain, nausea and vomiting.   HPI: Dylan Butler is a 21 y.o. male with medical history significant for T1DM, attention deficit disorder, anxiety and bipolar, who presents with the chief complain of nausea, vomiting and abdominal pain. This am around 5 am, patient developed acute abdominal pain, sharp in nature, moderate in intensity, associated with multiple episodes of nausea and vomiting, with no improving or worsening factors. At home he sustained fall while vomiting, with no syncope.   Patient has been low on long acting and short acting insulin, her mother has been giving lower doses to make insulin last longer, this am patient had last dose of long acting insulin 20 units. Capillary glucose noted to be persistently elevated, patient was brought to the hospital for further evaluation.    ED Course: patient has noted hyperglycemic, vomiting, positive anion gap, referred for admission and further treatment.   Review of Systems:  1. General. Fevers no chills 2. ENT no runny nose or sore throat 3. Pulmonary positive for cough, no shortness of breath or hemoptysis 4. Cardiovascular. No angina or claudication 5. Gastrointestinal positive for abdominal pain, nausea and vomiting, no diarrhea 6. Musculoskeletal positive for left lower extremity pain. 7. Dermatology no rashes 8. Hematology no easy bruisability or frequent infections 9. Urology no dysuria or increased frequency 10. Neurology no seizures or paresthesias  Past Medical History:  Diagnosis Date  . ADHD (attention deficit hyperactivity disorder)   . Anxiety   . Bipolar 1 disorder ([place])   . Diabetes mellitus without complication ([place])   . ODD (oppositional defiant disorder)     Past Surgical History:  Procedure Laterality Date  . HERNIA REPAIR       reports  that he has never smoked. He has never used smokeless tobacco. He reports that he does not drink alcohol. His drug history is not on file.  Allergies  Allergen Reactions  . Other Shortness Of Breath and Nausea And Vomiting    Honey Mustard    Family History  Problem Relation Age of Onset  . Diabetes Mellitus II Maternal Grandmother   . CAD Maternal Grandmother    Unacceptable: Noncontributory, unremarkable, or negative. Acceptable: Family history reviewed and not pertinent (If you reviewed it)  Prior to Admission medications   Medication Sig Start Date End Date Taking? Authorizing Provider  acetaminophen (TYLENOL) 500 MG tablet Take 500 mg by mouth every 6 (six) hours as needed for headache.   Yes Historical Provider, MD  atomoxetine (STRATTERA) 60 MG capsule Take 60 mg by mouth daily.   Yes Historical Provider, MD  blood glucose meter kit and supplies KIT Dispense based on patient and insurance preference. Use up to four times daily as directed. (FOR ICD-9 250.00, 250.01). 08/03/16  Yes Theodis Blaze, MD  cetirizine (ZYRTEC) 10 MG tablet Take 10 mg by mouth daily.   Yes Historical Provider, MD  fluticasone (FLONASE) 50 MCG/ACT nasal spray Place 2 sprays into both nostrils daily.   Yes Historical Provider, MD  guanFACINE (TENEX) 2 MG tablet Take 1 tablet (2 mg total) by mouth at bedtime. Patient taking differently: Take 4 mg by mouth at bedtime.  08/03/16  Yes Theodis Blaze, MD  insulin glargine (LANTUS) 100 UNIT/ML injection Inject 0.38 mLs (38 Units total) into the skin at bedtime. Patient taking differently: Inject 40 Units  into the skin daily.  08/03/16  Yes Theodis Blaze, MD  insulin lispro (HUMALOG) 100 UNIT/ML injection Inject 0.15-0.25 mLs (15-25 Units total) into the skin 3 (three) times daily before meals. Patient taking differently: Inject 15-25 Units into the skin 3 (three) times daily before meals. Take 15 units at breakfast and lunch and then 20 units at dinner, add an  additional 5 units per starch/carb calculations 08/03/16  Yes Theodis Blaze, MD  lisdexamfetamine (VYVANSE) 50 MG capsule Take 50 mg by mouth daily.   Yes Historical Provider, MD  ondansetron (ZOFRAN-ODT) 4 MG disintegrating tablet Take 1 tablet (4 mg total) by mouth every 8 (eight) hours as needed for nausea or vomiting. 08/03/16  Yes Theodis Blaze, MD  traZODone (DESYREL) 150 MG tablet Take 150 mg by mouth at bedtime.   Yes Historical Provider, MD  ziprasidone (GEODON) 80 MG capsule Take 160 mg by mouth at bedtime.    Yes Historical Provider, MD  traZODone (DESYREL) 100 MG tablet Take 1 tablet (100 mg total) by mouth at bedtime. Patient not taking: Reported on 11/26/2016 08/03/16   Theodis Blaze, MD    Physical Exam: Vitals:   11/26/16 1137 11/26/16 1314 11/26/16 1400  BP: 121/60 120/65 121/61  Pulse: 80 90 95  Resp: 18 17 18   Temp: 97.7 F (36.5 C)    TempSrc: Oral    SpO2: 97% 99% 100%      Constitutional: deconditioned  Vitals:   11/26/16 1137 11/26/16 1314 11/26/16 1400  BP: 121/60 120/65 121/61  Pulse: 80 90 95  Resp: 18 17 18   Temp: 97.7 F (36.5 C)    TempSrc: Oral    SpO2: 97% 99% 100%   Eyes: PERRL, lids and conjunctivae mild pale with no icterus.  Head normocephalic, nose and ears with no deformities.  ENMT: Mucous membranes are dry. Posterior pharynx clear of any exudate or lesions.Normal dentition.  Neck: normal, supple, no masses, no thyromegaly Respiratory: clear to auscultation bilaterally, no wheezing, no crackles. Normal respiratory effort. No accessory muscle use.  Cardiovascular: Regular rate and rhythm, no murmurs / rubs / gallops. No extremity edema. 2+ pedal pulses. No carotid bruits.  Abdomen: no tenderness, no masses palpated. No hepatosplenomegaly. Bowel sounds positive.  Musculoskeletal: no clubbing / cyanosis. No joint deformity upper and lower extremities. Good ROM, no contractures. Normal muscle tone.  Skin: no rashes, lesions, ulcers. No  induration Neurologic: CN 2-12 grossly intact. Sensation intact, DTR normal. Strength 5/5 in all 4.    Labs on Admission: I have personally reviewed following labs and imaging studies  CBC:  Recent Labs Lab 11/26/16 1141  WBC 17.0*  HGB 15.1  HCT 43.8  MCV 80.7  PLT 702   Basic Metabolic Panel:  Recent Labs Lab 11/26/16 1141  NA 131*  K 4.2  CL 95*  CO2 18*  GLUCOSE 285*  BUN 25*  CREATININE 0.97  CALCIUM 9.8   GFR: CrCl cannot be calculated (Unknown ideal weight.). Liver Function Tests: No results for input(s): AST, ALT, ALKPHOS, BILITOT, PROT, ALBUMIN in the last 168 hours. No results for input(s): LIPASE, AMYLASE in the last 168 hours. No results for input(s): AMMONIA in the last 168 hours. Coagulation Profile: No results for input(s): INR, PROTIME in the last 168 hours. Cardiac Enzymes: No results for input(s): CKTOTAL, CKMB, CKMBINDEX, TROPONINI in the last 168 hours. BNP (last 3 results) No results for input(s): PROBNP in the last 8760 hours. HbA1C: No results for input(s): HGBA1C in  the last 72 hours. CBG:  Recent Labs Lab 11/26/16 1208  GLUCAP 329*   Lipid Profile: No results for input(s): CHOL, HDL, LDLCALC, TRIG, CHOLHDL, LDLDIRECT in the last 72 hours. Thyroid Function Tests: No results for input(s): TSH, T4TOTAL, FREET4, T3FREE, THYROIDAB in the last 72 hours. Anemia Panel: No results for input(s): VITAMINB12, FOLATE, FERRITIN, TIBC, IRON, RETICCTPCT in the last 72 hours. Urine analysis:    Component Value Date/Time   COLORURINE YELLOW 11/26/2016 1307   APPEARANCEUR CLEAR 11/26/2016 1307   LABSPEC 1.030 11/26/2016 1307   PHURINE 5.0 11/26/2016 1307   GLUCOSEU >=500 (A) 11/26/2016 1307   HGBUR NEGATIVE 11/26/2016 1307   BILIRUBINUR NEGATIVE 11/26/2016 1307   KETONESUR 80 (A) 11/26/2016 1307   PROTEINUR NEGATIVE 11/26/2016 1307   NITRITE NEGATIVE 11/26/2016 1307   LEUKOCYTESUR NEGATIVE 11/26/2016 1307   Sepsis Labs:  !!!!!!!!!!!!!!!!!!!!!!!!!!!!!!!!!!!!!!!!!!!! @LABRCNTIP (procalcitonin:4,lacticidven:4) )No results found for this or any previous visit (from the past 240 hour(s)).   Radiological Exams on Admission: Dg Ribs Unilateral W/chest Right  Result Date: 11/26/2016 CLINICAL DATA:  Right rib pain after falling out of the bath tub this morning EXAM: RIGHT RIBS AND CHEST - 3+ VIEW COMPARISON:  Chest x-ray dated 07/31/2016 FINDINGS: No fracture or other bone lesions are seen involving the ribs. There is no evidence of pneumothorax or pleural effusion. Both lungs are clear. Heart size and mediastinal contours are within normal limits. IMPRESSION: Negative. Electronically Signed   By: Lorriane Shire M.D.   On: 11/26/2016 12:57   Dg Hand Complete Right  Result Date: 11/26/2016 CLINICAL DATA:  Right hand pain after falling out of the bath tub this morning. EXAM: RIGHT HAND - COMPLETE 3+ VIEW COMPARISON:  None. FINDINGS: There is no evidence of fracture or dislocation. There is no evidence of arthropathy or other focal bone abnormality. Soft tissues are unremarkable. IMPRESSION: Negative. Electronically Signed   By: Lorriane Shire M.D.   On: 11/26/2016 12:54   Dg Hip Unilat W Or Wo Pelvis 2-3 Views Right  Result Date: 11/26/2016 CLINICAL DATA:  Right hip pain after falling out of the bath tub this morning. EXAM: DG HIP (WITH OR WITHOUT PELVIS) 2-3V RIGHT COMPARISON:  None. FINDINGS: There is no evidence of hip fracture or dislocation. There is no evidence of arthropathy or other focal bone abnormality. IMPRESSION: Negative. Electronically Signed   By: Lorriane Shire M.D.   On: 11/26/2016 12:55    EKG: Independently reviewed.   Assessment/Plan Active Problems:   DKA (diabetic ketoacidoses) ([place])   This is a 21 year old male who has significant history of type 1 diabetes mellitus, who has been running out of his insulin. Early this morning he developed abdominal pain, nausea vomiting. On his initial physical  examination blood pressure 121/61, heart rate 95, respiratory 18, oxygen saturation 100%. Oral mucosa is dry, otherwise his lungs were clear to auscultation, heart S1-S2 present rhythmic, his abdomen is soft, nontender and nondistended. Venous blood gas with pH 7.28, sodium 131, potassium 4.2, chloride 95, bicarbonate 18, glucose 285, BUN 25, creatinine 0.97, white count 17.0, hemoglobin 15.1, hematocrit 43.8, platelets 235. Urinalysis greater than 500 glucose, negative proteins, 0-5 white cells.   Working diagnosis: Mild diabetes ketoacidosis.  1. Diabetes ketoacidosis. At this point patient with mild decompensation, will resume subcutaneous long acting insulin, 20 units of Levemir now, thn continue 40 units in the morning. Insulin sliding-scale for glucose coverage with glucose monitoring every 4 hours. Will order a clear liquid diet, and advance as tolerated. Continue supportive  fluids with isotonic saline at 75 mL per hour. Follow chemistry at 1600 hrs, to follow up on closing anion gap. K at 4,2.   2. Type 1 diabetes mellitus. Will resume subcutaneous insulin regimen, social services consult to arrange appropriate follow-up.  3. Bipolar and anxiety. Continue atomaxetine, guanfacine, lisdexamfetmaine, trazodone, and ziprasidone, no agitation or confusion.    DVT prophylaxis: enoxaparin  Code Status: Full Family Communication: I spoke with patient's mother at the bedside and all questions were addressed.  Disposition Plan: Home Consults called: none  Admission status: Observation    Ahaan Zobrist Gerome Apley MD [facility] Pager [phone]  If 7PM-7AM, please contact night-coverage [email or web address] Password [facility]  11/26/2016, 2:57 PM

## 2016-11-26 NOTE — Progress Notes (Addendum)
Medicaid [place] access response hx indicates the assigned pcp is [facility] [address] [place], [place] [id number] [phone]

## 2016-11-26 NOTE — ED Notes (Signed)
Patient transported to X-ray 

## 2016-11-27 DIAGNOSIS — E1310 Other specified diabetes mellitus with ketoacidosis without coma: Secondary | ICD-10-CM | POA: Diagnosis not present

## 2016-11-27 LAB — COMPREHENSIVE METABOLIC PANEL
ALBUMIN: 3.4 g/dL — AB (ref 3.5–5.0)
ALK PHOS: 73 U/L (ref 38–126)
ALT: 18 U/L (ref 17–63)
AST: 11 U/L — AB (ref 15–41)
Anion gap: 9 (ref 5–15)
BILIRUBIN TOTAL: 0.6 mg/dL (ref 0.3–1.2)
BUN: 9 mg/dL (ref 6–20)
CALCIUM: 8.5 mg/dL — AB (ref 8.9–10.3)
CO2: 24 mmol/L (ref 22–32)
Chloride: 106 mmol/L (ref 101–111)
Creatinine, Ser: 0.71 mg/dL (ref 0.61–1.24)
GFR calc Af Amer: >60 mL/min (ref >60)
GFR calc non Af Amer: >60 mL/min (ref >60)
GLUCOSE: 132 mg/dL — AB (ref 65–99)
Potassium: 3.9 mmol/L (ref 3.5–5.1)
Sodium: 139 mmol/L (ref 135–145)
TOTAL PROTEIN: 5.8 g/dL — AB (ref 6.5–8.1)

## 2016-11-27 LAB — CBC
HEMATOCRIT: 38 % — AB (ref 39.0–52.0)
Hemoglobin: 12.5 g/dL — ABNORMAL LOW (ref 13.0–17.0)
MCH: 26.2 pg (ref 26.0–34.0)
MCHC: 32.9 g/dL (ref 30.0–36.0)
MCV: 79.5 fL (ref 78.0–100.0)
Platelets: 223 10*3/uL (ref 150–400)
RBC: 4.78 MIL/uL (ref 4.22–5.81)
RDW: 12 % (ref 11.5–15.5)
WBC: 6.9 10*3/uL (ref 4.0–10.5)

## 2016-11-27 LAB — GLUCOSE, CAPILLARY
GLUCOSE-CAPILLARY: 128 mg/dL — AB (ref 65–99)
GLUCOSE-CAPILLARY: 166 mg/dL — AB (ref 65–99)
Glucose-Capillary: 224 mg/dL — ABNORMAL HIGH (ref 65–99)

## 2016-11-27 MED ORDER — INSULIN PEN NEEDLE 31G X 5 MM MISC: 1.0000 | 1 refills | Status: DC

## 2016-11-27 MED ORDER — INSULIN LISPRO 100 UNIT/ML (KWIKPEN): PEN_INJECTOR | SUBCUTANEOUS | 1 refills | Status: DC

## 2016-11-27 MED ORDER — INSULIN GLARGINE 100 UNIT/ML SOLOSTAR PEN: 40.0000 [IU] | PEN_INJECTOR | Freq: Every day | SUBCUTANEOUS | 1 refills | Status: DC

## 2016-11-27 MED ORDER — CETIRIZINE HCL 10 MG PO TABS
10.0000 mg | ORAL_TABLET | Freq: Every day | ORAL | 0 refills | Status: DC
Start: 2016-11-27 — End: 2017-07-11

## 2016-11-27 NOTE — Discharge Summary (Signed)
Physician Discharge Summary  Dylan Butler XTK:[id number] DOB: [birth year] DOA: 11/26/2016  PCP: PROVIDER NOT IN SYSTEM  Admit date: 11/26/2016 Discharge date: 11/27/2016  Admitted From: Home  Disposition:  Home   Recommendations for Outpatient Follow-up:  1. Follow up with PCP in 3 days 2. Follow up with endocrinology in 1-2 weeks  Discharge Condition: STABLE CODE STATUS: FULL  Diet recommendation: Carb Modified  Brief/Interim Summary: HPI: Dylan Butler is a 21 y.o. male with medical history significant for T1DM, attention deficit disorder, anxiety and bipolar, who presents with the chief complain of nausea, vomiting and abdominal pain. This am around 5 am, patient developed acute abdominal pain, sharp in nature, moderate in intensity, associated with multiple episodes of nausea and vomiting, with no improving or worsening factors. At home he sustained fall while vomiting, with no syncope.   Patient has been low on long acting and short acting insulin, her mother has been giving lower doses to make insulin last longer, this am patient had last dose of long acting insulin 20 units. Capillary glucose noted to be persistently elevated, patient was brought to the hospital for further evaluation.   ED Course: patient has noted hyperglycemic, vomiting, positive anion gap, referred for admission and further treatment.   1. Diabetes ketoacidosis. Mild and quickly resolved with fluids and insulin.  His anion gap corrected rapidly and he began eating and drinking well with no problems.  I gave him new prescriptions for his insulins and supplies (written Rxs and also electronically sent to the pharmacy of their choice).    2. Type 1 diabetes mellitus. Will resume subcutaneous insulin regimen, care management and social services consulted to arrange appropriate follow-up and assist him with getting his medications.  3. Bipolar and anxiety. Continue atomaxetine, guanfacine, lisdexamfetmaine, trazodone,  and ziprasidone, no agitation or confusion.    DVT prophylaxis: enoxaparin  Code Status: Full Family Communication: Mother Disposition Plan: Home Consults called: none  Admission status: Observation   Discharge Diagnoses:  Active Problems:   DKA (diabetic ketoacidoses) (Dylan Butler)  Discharge Instructions   Allergies as of 11/27/2016      Reactions   Other Shortness Of Breath, Nausea And Vomiting   Honey Mustard      Medication List    STOP taking these medications   insulin glargine 100 UNIT/ML injection Commonly known as:  LANTUS Replaced by:  Insulin Glargine 100 UNIT/ML Solostar Pen   insulin lispro 100 UNIT/ML injection Commonly known as:  HUMALOG Replaced by:  insulin lispro 100 UNIT/ML KiwkPen   ondansetron 4 MG disintegrating tablet Commonly known as:  ZOFRAN-ODT     TAKE these medications   acetaminophen 500 MG tablet Commonly known as:  TYLENOL Take 500 mg by mouth every 6 (six) hours as needed for headache.   atomoxetine 60 MG capsule Commonly known as:  STRATTERA Take 60 mg by mouth daily.   blood glucose meter kit and supplies Kit Dispense based on patient and insurance preference. Use up to four times daily as directed. (FOR ICD-9 250.00, 250.01).   cetirizine 10 MG tablet Commonly known as:  ZYRTEC Take 1 tablet (10 mg total) by mouth daily.   fluticasone 50 MCG/ACT nasal spray Commonly known as:  FLONASE Place 2 sprays into both nostrils daily.   guanFACINE 2 MG tablet Commonly known as:  TENEX Take 1 tablet (2 mg total) by mouth at bedtime. What changed:  how much to take   Insulin Glargine 100 UNIT/ML Solostar Pen Commonly known as:  LANTUS SOLOSTAR  Inject 40 Units into the skin daily. Replaces:  insulin glargine 100 UNIT/ML injection   insulin lispro 100 UNIT/ML KiwkPen Commonly known as:  HUMALOG KWIKPEN 15-25 Units Subcutaneous 3 times daily before meals as directed Replaces:  insulin lispro 100 UNIT/ML injection   Insulin Pen  Needle 31G X 5 MM Misc 1 Device by Does not apply route as directed.   lisdexamfetamine 50 MG capsule Commonly known as:  VYVANSE Take 50 mg by mouth daily.   traZODone 150 MG tablet Commonly known as:  DESYREL Take 150 mg by mouth at bedtime. What changed:  Another medication with the same name was removed. Continue taking this medication, and follow the directions you see here.   ziprasidone 80 MG capsule Commonly known as:  GEODON Take 160 mg by mouth at bedtime.      Follow-up Information    Medicaid [place] access response hx indicates the assigned pcp is [facility]. Schedule an appointment as soon as possible for a visit.   Why:  This is your assigned Medicaid [place] access doctor If you prefer to see another Medicaid doctor other than the one on your Medicaid card [place] [phone] or [phone]  Contact information: Medicaid [place] access response hx indicates the assigned pcp is [place], [place] [address] [phone]       As a Medicaid client you MUST contact DSS/SSI each time you change address,move to another [place] or another state to keep your address updated Follow up.   Contact information: [place]: [phone] [address], [place] [address] [email or web address] Use this website to assist with finding a Medicaid doctor, understanding your coverage & to renew application       [place]. Schedule an appointment as soon as possible for a visit in 3 day(s).   Why:  Hospital follow up  Contact information: [address]  [place] [address] [phone]       KERR,JEFFREY, MD. Schedule an appointment as soon as possible for a visit in 1 week(s).   Specialty:  Endocrinology Why:  Hospital Follow Up  Contact information: [address]  [place] [address] [phone]           Allergies  Allergen Reactions  . Other Shortness Of Breath and Nausea And Vomiting    Honey Mustard    Procedures/Studies: Dg Ribs Unilateral W/chest Right  Result Date: 11/26/2016 CLINICAL DATA:  Right rib pain after falling out of the bath tub this morning EXAM: RIGHT RIBS AND CHEST - 3+ VIEW COMPARISON:  Chest x-ray dated 07/31/2016 FINDINGS: No fracture or other bone lesions are seen involving the ribs. There is no evidence of pneumothorax or pleural effusion. Both lungs are clear. Heart size and mediastinal contours are within normal limits. IMPRESSION: Negative. Electronically Signed   By: Lorriane Shire M.D.   On: 11/26/2016 12:57   Dg Hand Complete Right  Result Date: 11/26/2016 CLINICAL DATA:  Right hand pain after falling out of the bath tub this morning. EXAM: RIGHT HAND - COMPLETE 3+ VIEW COMPARISON:  None. FINDINGS: There is no evidence of fracture or dislocation. There is no evidence of arthropathy or other focal bone abnormality. Soft tissues are unremarkable. IMPRESSION: Negative. Electronically Signed   By: Lorriane Shire M.D.   On: 11/26/2016 12:54   Dg Hip Unilat W Or Wo Pelvis 2-3 Views  Right  Result Date: 11/26/2016 CLINICAL DATA:  Right hip pain after falling out of the bath tub this morning. EXAM: DG HIP (WITH OR WITHOUT PELVIS) 2-3V RIGHT COMPARISON:  None. FINDINGS: There is no evidence of hip fracture or dislocation. There is no evidence of arthropathy or other focal bone abnormality. IMPRESSION: Negative. Electronically Signed   By: Lorriane Shire M.D.   On: 11/26/2016 12:55    Subjective: Pt ate well this morning for breakfast.  No emesis.    Discharge Exam: Vitals:   11/26/16 2200 11/27/16 0300  BP: (!) 110/58 (!) 114/50  Pulse: (!) 102 90  Resp: 18 18  Temp:  98.1 F (36.7 C)   Vitals:   11/26/16 1550 11/26/16 1959 11/26/16 2200 11/27/16 0300  BP: (!) 119/52 (!) 102/43 (!) 110/58 (!) 114/50  Pulse: 94 (!) 107 (!) 102 90  Resp: 18 18 18 18    Temp: 98 F (36.7 C) 98.4 F (36.9 C)  98.1 F (36.7 C)  TempSrc: Oral Oral  Oral  SpO2: 97% 98% 97% 99%  Weight: 56.7 kg (125 lb)     Height: 5' 2"  (1.575 m)      General: Pt is alert, awake, not in acute distress Cardiovascular: RRR, S1/S2 +, no rubs, no gallops Respiratory: CTA bilaterally, no wheezing, no rhonchi Abdominal: Soft, NT, ND, bowel sounds + Extremities: no edema, no cyanosis  The results of significant diagnostics from this hospitalization (including imaging, microbiology, ancillary and laboratory) are listed below for reference.     Microbiology: No results found for this or any previous visit (from the past 240 hour(s)).   Labs: BNP (last 3 results) No results for input(s): BNP in the last 8760 hours. Basic Metabolic Panel:  Recent Labs Lab 11/26/16 1141 11/26/16 1838 11/27/16 0612  NA 131* 132* 139  K 4.2 4.0 3.9  CL 95* 100* 106  CO2 18* 22 24  GLUCOSE 285* 274* 132*  BUN 25* 17 9  CREATININE 0.97 0.94 0.71  CALCIUM 9.8 8.2* 8.5*   Liver Function Tests:  Recent Labs Lab 11/27/16 0612  AST 11*  ALT 18  ALKPHOS 73  BILITOT 0.6  PROT 5.8*  ALBUMIN 3.4*   No results for input(s): LIPASE, AMYLASE in the last 168 hours. No results for input(s): AMMONIA in the last 168 hours. CBC:  Recent Labs Lab 11/26/16 1141 11/26/16 1837 11/27/16 0612  WBC 17.0* 10.9* 6.9  HGB 15.1 12.8* 12.5*  HCT 43.8 37.8* 38.0*  MCV 80.7 80.3 79.5  PLT 235 231 223   Cardiac Enzymes: No results for input(s): CKTOTAL, CKMB, CKMBINDEX, TROPONINI in the last 168 hours. BNP: Invalid input(s): POCBNP CBG:  Recent Labs Lab 11/26/16 1510 11/26/16 1650 11/26/16 1957 11/27/16 0428 11/27/16 0720  GLUCAP 165* 147* 238* 128* 166*   D-Dimer No results for input(s): DDIMER in the last 72 hours. Hgb A1c No results for input(s): HGBA1C in the last 72 hours. Lipid Profile No results for input(s): CHOL, HDL, LDLCALC, TRIG, CHOLHDL, LDLDIRECT in the last 72  hours. Thyroid function studies No results for input(s): TSH, T4TOTAL, T3FREE, THYROIDAB in the last 72 hours.  Invalid input(s): FREET3 Anemia work up No results for input(s): VITAMINB12, FOLATE, FERRITIN, TIBC, IRON, RETICCTPCT in the last 72 hours. Urinalysis    Component Value Date/Time   COLORURINE YELLOW 11/26/2016 1307   APPEARANCEUR CLEAR 11/26/2016 1307   LABSPEC 1.030 11/26/2016 1307   PHURINE 5.0 11/26/2016 1307   GLUCOSEU >=500 (A) 11/26/2016 1307   HGBUR  NEGATIVE 11/26/2016 [place] 11/26/2016 1307   KETONESUR 80 (A) 11/26/2016 1307   PROTEINUR NEGATIVE 11/26/2016 1307   NITRITE NEGATIVE 11/26/2016 1307   LEUKOCYTESUR NEGATIVE 11/26/2016 1307   Sepsis Labs Invalid input(s): PROCALCITONIN,  WBC,  LACTICIDVEN Microbiology No results found for this or any previous visit (from the past 240 hour(s)).  Time coordinating discharge: 31 minutes  SIGNED:  Irwin Brakeman, MD  [facility] 11/27/2016, 10:24 AM Pager   If 7PM-7AM, please contact night-coverage [email or web address] Password [email or web address]

## 2016-11-27 NOTE — Discharge Instructions (Signed)
Diabetes Mellitus and Standards of Medical Care Managing diabetes (diabetes mellitus) can be complicated. Your diabetes treatment may be managed by a team of health care providers, including:  A diet and nutrition specialist (registered dietitian).  A nurse.  A certified diabetes educator (CDE).  A diabetes specialist (endocrinologist).  An eye doctor.  A primary care provider.  A dentist. Your health care providers follow a schedule in order to help you get the best quality of care. The following schedule is a general guideline for your diabetes management plan. Your health care providers may also give you more specific instructions. HbA1c ( hemoglobin A1c) test This test provides information about blood sugar (glucose) control over the previous 2-3 months. It is used to check whether your diabetes management plan needs to be adjusted.  If you are meeting your treatment goals, this test is done at least 2 times a year.  If you are not meeting treatment goals or if your treatment goals have changed, this test is done 4 times a year. Blood pressure test  This test is done at every routine medical visit. For most people, the goal is less than 140/90. In some cases, your goal blood pressure may be 130/80 or less. Ask your health care provider what your goal blood pressure should be. Dental and eye exams  Visit your dentist two times a year.  If you have type 1 diabetes, get an eye exam 3-5 years after you are diagnosed, and then once a year after your first exam.  If you were diagnosed with type 1 diabetes as a child, get an eye exam when you are age 39 or older and have had diabetes for 3-5 years. After the first exam, you should get an eye exam once a year.  If you have type 2 diabetes, have an eye exam as soon as you are diagnosed, and then once a year after your first exam. Foot care exam  Visual foot exams are done at every routine medical visit. The exams check for cuts,  bruises, redness, blisters, sores, or other problems with the feet.  A complete foot exam is done by your health care provider once a year. This exam includes an inspection of the structure and skin of your feet, and a check of the pulses and sensation in your feet.  Type 1 diabetes: Get your first exam 3-5 years after diagnosis.  Type 2 diabetes: Get your first exam as soon as you are diagnosed.  Check your feet every day for cuts, bruises, redness, blisters, or sores. If you have any of these or other problems that are not healing, contact your health care provider. Kidney function test ( urine microalbumin)  This test is done once a year.  Type 1 diabetes: Get your first test 5 years after diagnosis.  Type 2 diabetes: Get your first test as soon as you are diagnosed.  If you have chronic kidney disease (CKD), get a serum creatinine and estimated glomerular filtration rate (eGFR) test once a year. Lipid profile (cholesterol, HDL, LDL, triglycerides)  This test should be done when you are diagnosed with diabetes, and every 5 years after the first test. If you are on medicines to lower your cholesterol, you may need to get this test done every year.  The goal for LDL is less than 100 mg/dL (5.5 mmol/L). If you are at high risk, the goal is less than 70 mg/dL (3.9 mmol/L).  The goal for HDL is 40 mg/dL (2.2 mmol/L)  for men and 50 mg/dL(2.8 mmol/L) for women. An HDL cholesterol of 60 mg/dL (3.3 mmol/L) or higher gives some protection against heart disease.  The goal for triglycerides is less than 150 mg/dL (8.3 mmol/L). Immunizations  The yearly flu (influenza) vaccine is recommended for everyone 6 months or older who has diabetes.  The pneumonia (pneumococcal) vaccine is recommended for everyone 2 years or older who has diabetes. If you are 40 or older, you may get the pneumonia vaccine as a series of two separate shots.  The hepatitis B vaccine is recommended for adults shortly  after they have been diagnosed with diabetes.  The Tdap (tetanus, diphtheria, and pertussis) vaccine should be given:  According to normal childhood vaccination schedules, for children.  Every 10 years, for adults who have diabetes.  The shingles vaccine is recommended for people who have had chicken pox and are 50 years or older. Mental and emotional health  Screening for symptoms of eating disorders, anxiety, and depression is recommended at the time of diagnosis and afterward as needed. If your screening shows that you have symptoms (you have a positive screening result), you may need further evaluation and be referred to a mental health care provider. Diabetes self-management education  Education about how to manage your diabetes is recommended at diagnosis and ongoing as needed. Treatment plan  Your treatment plan will be reviewed at every medical visit. Summary  Managing diabetes (diabetes mellitus) can be complicated. Your diabetes treatment may be managed by a team of health care providers.  Your health care providers follow a schedule in order to help you get the best quality of care.  Standards of care including having regular physical exams, blood tests, blood pressure monitoring, immunizations, screening tests, and education about how to manage your diabetes.  Your health care providers may also give you more specific instructions based on your individual health. This information is not intended to replace advice given to you by your health care provider. Make sure you discuss any questions you have with your health care provider. Document Released: 08/19/2009 Document Revised: 07/20/2016 Document Reviewed: 07/20/2016 Elsevier Interactive Patient Education  2017 [place].   Diabetes Mellitus and Sick Day Management Blood sugar (glucose) can be difficult to control when you are sick. Common illnesses that can cause problems for people with diabetes (diabetes mellitus)  include colds, fever, flu (influenza), nausea, vomiting, and diarrhea. These illnesses can cause stress and loss of body fluids (dehydration), and those issues can cause blood glucose levels to increase. Because of this, it is very important to take your insulin and diabetes medicines and eat some form of carbohydrate when you are sick. You should make a plan for days when you are sick (sick day plan) as part of your diabetes management plan. You and your health care provider should make this plan in advance. The following guidelines are intended to help you manage an illness that lasts for about 24 hours or less. Your health care provider may also give you more specific instructions. What do I need to do to manage my blood glucose?  Check your blood glucose every 2-4 hours, or as often as told by your health care provider.  Know your sick day treatment goals. Your target blood glucose levels may be different when you are sick.  If you use insulin, take your usual dose.  If your blood glucose continues to be too high, you may need to take an additional insulin dose as told by your health  care provider.  If you use oral diabetes medicine, you may need to stop taking it if you are not able to eat or drink normally. Ask your health care provider about whether you need to stop taking these medicines while you are sick.  If you use injectable hormone medicines other than insulin to control your diabetes, ask your health care provider about whether you need to stop taking these medicines while you are sick. What else can I do to manage my diabetes when I am sick? Check your ketones  If you have type 1 diabetes, check your urine ketones every 4 hours.  If you have type 2 diabetes, check your urine ketones as often as told by your health care provider. Drink fluids  Drink enough fluid to keep your urine clear or pale yellow. This is especially important if you have a fever, vomiting, or diarrhea.  Those symptoms can lead to dehydration.  Follow any instructions from your health care provider about beverages to avoid.  Do not drink alcohol, caffeine, or drinks that contain a lot of sugar. Take medicines as directed  Take-over-the-counter and prescription medicines only as told by your health care provider.  Check medicine labels for added sugars. Some medicines may contain sugar or types of sugars that can raise your blood glucose level. What foods can I eat when I am sick? You need to eat some form of carbohydrates when you are sick. You should eat 45-50 grams (45-50 g) of carbohydrates every 3-4 hours until you feel better. All of the food choices below contain about 15 g of carbohydrates. Plan ahead and keep some of these foods around so you have them if you get sick.  4-6 oz (120-177 mL) carbonated beverage that contains sugar, such as regular (not diet) soda. You may be able to drink carbonated beverages more easily if you open the beverage and let it sit at room temperature for a few minutes before drinking.   of a twin frozen ice pop.  4 oz (120 g) regular gelatin.  4 oz (120 mL) fruit juice.  4 oz (120 g) ice cream or frozen yogurt.  2 oz (60 g) sherbet.  8 oz (240 mL) clear broth or soup.  4 oz (120 g) regular custard.  4 oz (120 g) regular pudding.  8 oz (240 g) plain yogurt.  1 slice bread or toast.  6 saltine crackers.  5 vanilla wafers. Questions to ask your health care provider Consider asking the following questions so you know what to do on days when you are sick:  Should I adjust my diabetes medicines?  How often do I need to check my blood glucose?  What supplies do I need to manage my diabetes at home when I am sick?  What number can I call if I have questions?  What foods and drinks should I avoid? Contact a health care provider if:  You develop symptoms of diabetic ketoacidosis, such as:  Fatigue.  Weight loss.  Excessive  thirst.  Light-headedness.  Fruity or sweet-smelling breath.  Excessive urination.  Vision changes.  Confusion or irritability.  Nausea.  Vomiting.  Rapid breathing.  Pain in the abdomen.  Feeling flushed.  You are unable to drink fluids without vomiting.  You have any of the following for more than 6 hours:  Nausea.  Vomiting.  Diarrhea.  Your blood glucose is at or above 240 mg/dL (13.3 mmol/L), even after you take an additional insulin dose.  You have  a change in how you think, feel, or act (mental status).  You develop another serious illness.  You have been sick or have had a fever for 2 days or longer and you are not getting better. Get help right away if:  Your blood glucose is lower than 54 mg/dL (3.0 mmol/L).  You have difficulty breathing.  You have moderate or high ketone levels in your urine.  You used emergency glucagon to treat low blood glucose. Summary  Blood sugar (glucose) can be difficult to control when you are sick. Common illnesses that can cause problems for people with diabetes (diabetes mellitus) include colds, fever, flu (influenza), nausea, vomiting, and diarrhea.  Illnesses can cause stress and loss of body fluids (dehydration), and those issues can cause blood glucose levels to increase.  Make a plan for days when you are sick (sick day plan) as part of your diabetes management plan. You and your health care provider should make this plan in advance.  It is very important to take your insulin and diabetes medicines and to eat some form of carbohydrate when you are sick.  Contact your health care provider if have problems managing your blood glucose levels when you are sick, or if you have been sick or had a fever for 2 days or longer and are not getting better. This information is not intended to replace advice given to you by your health care provider. Make sure you discuss any questions you have with your health care  provider. Document Released: 10/25/2003 Document Revised: 07/20/2016 Document Reviewed: 07/20/2016 Elsevier Interactive Patient Education  2017 [place].   Diabetes and Foot Care Diabetes may cause you to have problems because of poor blood supply (circulation) to your feet and legs. This may cause the skin on your feet to become thinner, break easier, and heal more slowly. Your skin may become dry, and the skin may peel and crack. You may also have nerve damage in your legs and feet causing decreased feeling in them. You may not notice minor injuries to your feet that could lead to infections or more serious problems. Taking care of your feet is one of the most important things you can do for yourself. Follow these instructions at home:  Wear shoes at all times, even in the house. Do not go barefoot. Bare feet are easily injured.  Check your feet daily for blisters, cuts, and redness. If you cannot see the bottom of your feet, use a mirror or ask someone for help.  Wash your feet with warm water (do not use hot water) and mild soap. Then pat your feet and the areas between your toes until they are completely dry. Do not soak your feet as this can dry your skin.  Apply a moisturizing lotion or petroleum jelly (that does not contain alcohol and is unscented) to the skin on your feet and to dry, brittle toenails. Do not apply lotion between your toes.  Trim your toenails straight across. Do not dig under them or around the cuticle. File the edges of your nails with an emery board or nail file.  Do not cut corns or calluses or try to remove them with medicine.  Wear clean socks or stockings every day. Make sure they are not too tight. Do not wear knee-high stockings since they may decrease blood flow to your legs.  Wear shoes that fit properly and have enough cushioning. To break in new shoes, wear them for just a few hours a  day. This prevents you from injuring your feet. Always look in  your shoes before you put them on to be sure there are no objects inside.  Do not cross your legs. This may decrease the blood flow to your feet.  If you find a minor scrape, cut, or break in the skin on your feet, keep it and the skin around it clean and dry. These areas may be cleansed with mild soap and water. Do not cleanse the area with peroxide, alcohol, or iodine.  When you remove an adhesive bandage, be sure not to damage the skin around it.  If you have a wound, look at it several times a day to make sure it is healing.  Do not use heating pads or hot water bottles. They may burn your skin. If you have lost feeling in your feet or legs, you may not know it is happening until it is too late.  Make sure your health care provider performs a complete foot exam at least annually or more often if you have foot problems. Report any cuts, sores, or bruises to your health care provider immediately. Contact a health care provider if:  You have an injury that is not healing.  You have cuts or breaks in the skin.  You have an ingrown nail.  You notice redness on your legs or feet.  You feel burning or tingling in your legs or feet.  You have pain or cramps in your legs and feet.  Your legs or feet are numb.  Your feet always feel cold. Get help right away if:  There is increasing redness, swelling, or pain in or around a wound.  There is a red line that goes up your leg.  Pus is coming from a wound.  You develop a fever or as directed by your health care provider.  You notice a bad smell coming from an ulcer or wound. This information is not intended to replace advice given to you by your health care provider. Make sure you discuss any questions you have with your health care provider. Document Released: 10/19/2000 Document Revised: 03/29/2016 Document Reviewed: 03/31/2013 Elsevier Interactive Patient Education  2017 [employer] Inc.   Type 1 Diabetes Mellitus, Diagnosis,  Adult Type 1 diabetes (type 1 diabetes mellitus) is a long-term (chronic) disease. It occurs when the pancreas does not make enough of a hormone called insulin. Normally, insulin allows sugars (glucose) to enter cells in the body. The cells use glucose for energy. Lack of insulin causes excess glucose to build up in the blood instead of going into cells. As a result, high blood glucose (hyperglycemia) develops. The exact cause of type 1 diabetes is not known. There is currently no cure for type 1 diabetes, but it can be managed with insulin treatment and lifestyle changes. What increases the risk? You may be more likely to develop this condition if you have a family member who has type 1 diabetes. Other factors may also make you more likely to develop type 1 diabetes, such as:  Having a gene for type 1 diabetes that is passed along from parent to child (inherited).  Living in an area with cold weather conditions.  Exposure to certain viruses.  Certain conditions in which the body's disease-fighting (immune) system attacks itself (autoimmune disorders). What are the signs or symptoms? Symptoms may develop gradually, over days or weeks, or they may develop suddenly. Symptoms may include:  Increased thirst (polydipsia).  Increased hunger(polyphagia).  Increased urination (polyuria).  Increased urination during the night (nocturia).  Sudden or unexplained weight changes.  Frequent infections that keep coming back (recurring).  Fatigue.  Weakness.  Vision changes, such as blurry vision.  Fruity-smelling breath.  Cuts or bruises that are slow to heal.  Tingling or numbness in the hands or feet. How is this diagnosed?   This condition is diagnosed based on your symptoms, your medical history, a physical exam, and your blood glucose level. Your blood glucose may be checked with one or more of the following blood tests:  A fasting blood glucose (FBG) test. You will not be allowed  to eat (you will fast) for at least 8 hours before a blood sample is taken.  A random blood glucose test. This checks blood glucose at any time of day regardless of when you ate.  An A1c (hemoglobin A1c) blood test. This provides information about blood glucose control over the previous 2-3 months. You may be diagnosed with type 1 diabetes if:  Your FBG level is 126 mg/dL (7.0 mmol/L) or higher.  Your random blood glucose level is 200 mg/dL (11.1 mmol/L) or higher.  Your A1c level is 6.5% or higher. These blood tests may be repeated to confirm your diagnosis. How is this treated? Your treatment may be managed by a specialist called an endocrinologist. Type 1 diabetes can be managed by following instructions from your health care provider about:  Taking insulin daily. This helps to keep your blood glucose levels in the healthy range.  You may need to adjust your insulin dosage based on how physically active you are and what foods you eat. Your health care provider will tell you how to do this.  Taking medicines to help prevent complications from diabetes, such as:  Aspirin.  Medicine to lower cholesterol.  Medicine to control blood pressure.  Checking your blood glucose as often as directed.  Making diet and lifestyle changes. These may include:  Following an individualized nutrition plan that is developed by a diet and nutrition specialist (registered dietitian).  Exercising regularly.  Finding ways to manage stress. Your health care provider will set individualized treatment goals for you. Your goals will be based on your age, other medical conditions you have, and how you respond to diabetes treatment. Generally, the goal of treatment is to maintain the following blood glucose levels:  Before meals (preprandial): 80-130 mg/dL (4.4-7.2 mmol/L).  After meals (postprandial): below 180 mg/dL (10 mmol/L).  A1c level: less than 7%. Follow these instructions at home: Questions  to [facility] Provider   Consider asking the following questions:  Do I need to meet with a diabetes educator?  Should I consider joining a support group for people with diabetes?  What equipment will I need to manage my diabetes at home?  What diabetes medicines should I take, and when?  How often should I check my blood glucose?  What number should I call if I have questions?  When is my next appointment? General instructions  Take over-the-counter and prescription medicines only as told by your health care provider.  Keep all follow-up visits as told by your health care provider. This is important.  For more information about diabetes, visit:  American Diabetes Association (ADA): www.diabetes.org  American Association of Diabetes Educators (AADE): www.diabeteseducator.org/patient-resources Contact a health care provider if:  Your blood glucose level is higher than 240 mg/dL (13.3 mmol/L) for 2 days in a row.  You have been sick or have had a fever for 2  days or more and you are not getting better.  You have any of the following problems for more than 6 hours:  You cannot eat or drink.  You have nausea and vomiting.  You have diarrhea. Get help right away if:  Your blood glucose is below 54 mg/dL (3 mmol/L).  You become confused or you have trouble thinking clearly.  You have difficulty breathing.  You have moderate or large ketone levels in your urine. This information is not intended to replace advice given to you by your health care provider. Make sure you discuss any questions you have with your health care provider. Document Released: 10/19/2000 Document Revised: 03/29/2016 Document Reviewed: 11/25/2015 Elsevier Interactive Patient Education  2017 [employer].

## 2016-11-27 NOTE — Progress Notes (Signed)
LCSWA met with patient and mother at bedside. Patient mother expresses her family will loose their room at the motel where they have been living. She reports she will be able to find stable housing in two weeks but needs a stable place until then. LCSWA offered resources for shelter placement. Patient mother reports, they have looked into shelter placement before but not willing to go because the shelter does not allow her service dog and two pet dogs. Pt. Mother reports she has friends that are helping to assist with emergency placement at this time.  LCSWA encouraged family to go the [facility] and educated them about their services.   Patient mother reports she receives food stamps for food. LCSWA provided patient with bus tickets for transportation. Patient and mother thankful for community resources. No other needs identified at this time.

## 2016-11-27 NOTE — Progress Notes (Signed)
Spoke with patient's mother at bedside, patient slept through visit. Mother states she manages patient's insulin, he has learning disability as well as behavioral issues that complicate management of his diabetes. She states they have recently moved here from [place], [facility]. The patient has a psychiatrist that he sees in [place] per mom. The only issues with medication is that they had recently changed PCPs and did not have access to Lantus. Mom states she got a script now from attending so she has what she needs for now. She reports she has glucometer and test strips as well. She has an appt with [facility] for the end of the month. She told me that she currently is homeless, her husband is moving their things out of the hotel where they had been living. Contacted CSW to provide some shelter resources. She is concerned because she has a service dog that prevents her from staying at some of the shelters. Plan for d/c today once seen by CSW.

## 2016-11-27 NOTE — Progress Notes (Signed)
Inpatient Diabetes Program Recommendations  AACE/ADA: New Consensus Statement on Inpatient Glycemic Control (2015)  Target Ranges:  Prepandial:   less than 140 mg/dL      Peak postprandial:   less than 180 mg/dL (1-2 hours)      Critically ill patients:  140 - 180 mg/dL   Lab Results  Component Value Date   GLUCAP 166 (H) 11/27/2016   HGBA1C 10.4 (H) 07/31/2016    Review of Glycemic Control  Spoke with pt's Mom regarding his glycemic control at home. Mom states she manages pt's insulin and blood sugar monitoring. Has plenty of strips available and will f[facility] for [facility]. Mom states primary issue is homelessness. Case manager contacted social work for consult.  To pick up insulin prescriptions from [facility].  Discussed with case Dentistmanager and RN.   Thank you. Ailene Ardshonda Prestina Raigoza, RD, LDN, CDE Inpatient Diabetes Coordinator [phone]

## 2016-11-29 ENCOUNTER — Other Ambulatory Visit: Payer: Self-pay | Admitting: Internal Medicine

## 2016-11-29 MED ORDER — INSULIN PEN NEEDLE 31G X 6 MM MISC: 1.0000 | Freq: Every day | 1 refills | Status: AC

## 2016-11-29 NOTE — Progress Notes (Signed)
I- Hospitalist rounding doctor at [facility] pts pharmacy called to clarify prescription- change 31/825mm insulin pen needles which is not available, to 31/736mm needles. Order changed.  Kennis CarinaEjiroghene Kaitlan Bin, MD. TRH.

## 2016-12-04 ENCOUNTER — Ambulatory Visit: Payer: Medicaid Other | Admitting: Physician Assistant

## 2016-12-06 ENCOUNTER — Encounter: Payer: Self-pay | Admitting: Physician Assistant

## 2016-12-06 ENCOUNTER — Ambulatory Visit: Payer: Medicaid Other | Attending: Family Medicine | Admitting: Physician Assistant

## 2016-12-06 VITALS — BP 116/76 | HR 107 | Temp 99.0°F | Resp 16 | Ht 64.0 in | Wt 139.6 lb

## 2016-12-06 DIAGNOSIS — Z5189 Encounter for other specified aftercare: Secondary | ICD-10-CM | POA: Insufficient documentation

## 2016-12-06 DIAGNOSIS — R300 Dysuria: Secondary | ICD-10-CM | POA: Diagnosis not present

## 2016-12-06 DIAGNOSIS — E1010 Type 1 diabetes mellitus with ketoacidosis without coma: Secondary | ICD-10-CM | POA: Diagnosis not present

## 2016-12-06 DIAGNOSIS — B9789 Other viral agents as the cause of diseases classified elsewhere: Secondary | ICD-10-CM | POA: Diagnosis not present

## 2016-12-06 DIAGNOSIS — Z794 Long term (current) use of insulin: Secondary | ICD-10-CM | POA: Insufficient documentation

## 2016-12-06 DIAGNOSIS — Z79899 Other long term (current) drug therapy: Secondary | ICD-10-CM | POA: Insufficient documentation

## 2016-12-06 DIAGNOSIS — J069 Acute upper respiratory infection, unspecified: Secondary | ICD-10-CM

## 2016-12-06 LAB — CBC WITH DIFFERENTIAL/PLATELET
BASOS ABS: 56 {cells}/uL (ref 0–200)
BASOS PCT: 1 %
EOS ABS: 56 {cells}/uL (ref 15–500)
EOS PCT: 1 %
HCT: 44.1 % (ref 38.5–50.0)
HEMOGLOBIN: 14.5 g/dL (ref 13.2–17.1)
LYMPHS ABS: 1960 {cells}/uL (ref 850–3900)
Lymphocytes Relative: 35 %
MCH: 27.3 pg (ref 27.0–33.0)
MCHC: 32.9 g/dL (ref 32.0–36.0)
MCV: 82.9 fL (ref 80.0–100.0)
MONOS PCT: 7 %
MPV: 9.6 fL (ref 7.5–12.5)
Monocytes Absolute: 392 cells/uL (ref 200–950)
NEUTROS ABS: 3136 {cells}/uL (ref 1500–7800)
Neutrophils Relative %: 56 %
PLATELETS: 343 10*3/uL (ref 140–400)
RBC: 5.32 MIL/uL (ref 4.20–5.80)
RDW: 13.3 % (ref 11.0–15.0)
WBC: 5.6 10*3/uL (ref 3.8–10.8)

## 2016-12-06 LAB — HEMOGLOBIN A1C
HEMOGLOBIN A1C: 9.2 % — AB (ref <5.7)
Mean Plasma Glucose: 217 mg/dL

## 2016-12-06 LAB — COMPREHENSIVE METABOLIC PANEL
ALBUMIN: 4.3 g/dL (ref 3.6–5.1)
ALT: 10 U/L (ref 8–46)
AST: 13 U/L (ref 12–32)
Alkaline Phosphatase: 84 U/L (ref 48–230)
BUN: 10 mg/dL (ref 7–20)
CALCIUM: 9.5 mg/dL (ref 8.9–10.4)
CO2: 28 mmol/L (ref 20–31)
Chloride: 99 mmol/L (ref 98–110)
Creat: 0.94 mg/dL (ref 0.60–1.26)
GLUCOSE: 105 mg/dL — AB (ref 65–99)
POTASSIUM: 4.1 mmol/L (ref 3.8–5.1)
Sodium: 136 mmol/L (ref 135–146)
Total Bilirubin: 0.3 mg/dL (ref 0.2–1.1)
Total Protein: 6.8 g/dL (ref 6.3–8.2)

## 2016-12-06 LAB — GLUCOSE, POCT (MANUAL RESULT ENTRY): POC GLUCOSE: 169 mg/dL — AB (ref 70–99)

## 2016-12-06 LAB — POCT URINALYSIS DIPSTICK
BILIRUBIN UA: NEGATIVE
GLUCOSE UA: 500
KETONES UA: 40
Leukocytes, UA: NEGATIVE
Nitrite, UA: NEGATIVE
PH UA: 6
Protein, UA: NEGATIVE
RBC UA: NEGATIVE
SPEC GRAV UA: 1.02
Urobilinogen, UA: 0.2

## 2016-12-06 MED ORDER — SODIUM CHLORIDE 0.9 % IV SOLN
Freq: Once | INTRAVENOUS | Status: AC
  Administered 2016-12-06: 15:00:00 via INTRAVENOUS

## 2016-12-06 MED ORDER — PHENYLEPHRINE-DM-GG-APAP 5-10-200-325 MG/10ML PO LIQD: 20.0000 mL | ORAL | 0 refills | Status: AC

## 2016-12-06 NOTE — Progress Notes (Signed)
Patient ID: Edahi Kroening, male   DOB: [birth year], 21 y.o.   MRN: [id number]    Subjective:  Patient ID: Dylan Butler, male    DOB: [birth year]  Age: 21 y.o. MRN: [id number]  CC: No chief complaint on file.   HPI Dylan Butler is a 21 y.o. male with a PMH of   Past Medical History:  Diagnosis Date  . ADHD (attention deficit hyperactivity disorder)   . Anxiety   . Bipolar 1 disorder ([place])   . Diabetes mellitus without complication ([place])   . ODD (oppositional defiant disorder)     that presents for a hospital follow up of DKA without coma (please refer to ED discharge notes). Was discharged 8 days ago. Patient states he is feeling much better. Mother accompanies patient and states she has also noted nasal congestion, sneezing, coughing, and temperature of 99.0. She also states patient has been complaining of mild dysuria. There are no other associated respiratory or genitourinary symptoms. Patient denies any symptoms.  Outpatient Medications Prior to Visit  Medication Sig Dispense Refill  . acetaminophen (TYLENOL) 500 MG tablet Take 500 mg by mouth every 6 (six) hours as needed for headache.    Marland Kitchen atomoxetine (STRATTERA) 60 MG capsule Take 60 mg by mouth daily.    . blood glucose meter kit and supplies KIT Dispense based on patient and insurance preference. Use up to four times daily as directed. (FOR ICD-9 250.00, 250.01). 1 each 0  . cetirizine (ZYRTEC) 10 MG tablet Take 1 tablet (10 mg total) by mouth daily. 30 tablet 0  . fluticasone (FLONASE) 50 MCG/ACT nasal spray Place 2 sprays into both nostrils daily.    Marland Kitchen guanFACINE (TENEX) 2 MG tablet Take 1 tablet (2 mg total) by mouth at bedtime. (Patient taking differently: Take 4 mg by mouth at bedtime. ) 30 tablet 0  . Insulin Glargine (LANTUS SOLOSTAR) 100 UNIT/ML Solostar Pen Inject 40 Units into the skin daily. 15 mL 1  . insulin lispro (HUMALOG KWIKPEN) 100 UNIT/ML KiwkPen 15-25 Units Subcutaneous 3 times daily before meals as directed 15 mL 1  .  Insulin Pen Needle 31G X 6 MM MISC 1 Device by Does not apply route daily at 8 pm. 31 each 1  . lisdexamfetamine (VYVANSE) 50 MG capsule Take 50 mg by mouth daily.    . traZODone (DESYREL) 150 MG tablet Take 150 mg by mouth at bedtime.    . ziprasidone (GEODON) 80 MG capsule Take 160 mg by mouth at bedtime.      No facility-administered medications prior to visit.     ROS Review of Systems  Constitutional: Negative for chills, fatigue and fever.  HENT: Negative for ear pain, sinus pain, sinus pressure and sore throat.   Eyes: Negative for discharge and visual disturbance.  Respiratory: Positive for cough. Negative for shortness of breath.   Cardiovascular: Negative for chest pain.  Gastrointestinal: Negative for abdominal pain, nausea and vomiting.  Genitourinary: Positive for flank pain (RLQ and RUQ from the fall (see ED discharge)).  Musculoskeletal: Negative for back pain.  Skin: Negative for rash and wound.  Neurological: Negative for headaches.    Objective:  BP 116/76 (BP Location: Left Arm, Patient Position: Sitting, Cuff Size: Normal)   Pulse (!) 107   Temp 99 F (37.2 C) (Oral)   Resp 16   Ht 5' 4"  (1.626 m)   Wt 139 lb 9.6 oz (63.3 kg)   SpO2 98%   BMI 23.96 kg/m   BP/Weight 12/06/2016  11/27/2016 2/95/1884  Systolic BP 166 063 -  Diastolic BP 76 50 -  Wt. (Lbs) 139.6 - 125  BMI 23.96 - 22.86      Physical Exam  Constitutional: He is oriented to person, place, and time.  Pt with mild cognitive disability, holding teddy bear, normal body habitus, NAD  HENT:  Head: Normocephalic and atraumatic.  Eyes: Conjunctivae are normal. No scleral icterus.  Neck: No thyromegaly present.  Cardiovascular: Normal rate, regular rhythm and normal heart sounds.   Pulmonary/Chest: Effort normal and breath sounds normal.  Abdominal: Soft. Bowel sounds are normal. There is tenderness (TTP in the RUQ and RLQ. No erythema, edema, or ecchymosis in the RUQ or RLQ).  Musculoskeletal:  He exhibits no edema or tenderness.  Lymphadenopathy:    He has cervical adenopathy (mild cervical lymphadenopathy bilaterally ).  Neurological: He is alert and oriented to person, place, and time.  Skin: Skin is warm and dry. No rash noted.  Psychiatric: He has a normal mood and affect. His behavior is normal.     Assessment & Plan:   1. Type 1 diabetes mellitus with ketoacidosis without coma (HCC) - Ketones and glucose in the urine today. We have given one liter of normal saline and will repeat UA tomorrow. 4 week follow up for now but will reconsider based on urinalysis result tomorrow. I have also put in order for patient to see Dr. Berkley Harvey at [facility].  - Glucose (CBG) - Urinalysis Dipstick - Comprehensive metabolic panel - CBC with Differential/Platelet - Hemoglobin A1c - 0.9 %  sodium chloride infusion; Inject into the vein once. - Ambulatory referral to Endocrinology - Urinalysis Dipstick; Future  2. Dysuria - Sensation of mild itching/burning likely attributed to excess ketones and sugar in the urine. Negative leukocytes and nitrites in the urine. Patient does not endorse penile lesions or discharge.  - Urinalysis Dipstick - Comprehensive metabolic panel - CBC with Differential/Platelet  3. Viral upper respiratory tract infection  - CBC with Differential/Platelet -Recommended Mucinex Fast-Max Cold and Flu  Meds ordered this encounter  Medications  . 0.9 %  sodium chloride infusion  . Phenylephrine-DM-GG-APAP (MUCINEX FAST-MAX COLD FLU) 5-10-200-325 MG/10ML LIQD    Sig: Take 20 mLs by mouth every 4 (four) hours.    Dispense:  1 Bottle    Refill:  0    Order Specific Question:   Supervising Provider    Answer:   Tresa Garter [id number]    Follow-up: Return in about 4 weeks (around 01/03/2017) for Diabetes follow up.   Clent Demark PA

## 2016-12-06 NOTE — Progress Notes (Signed)
Right side pain after fall Cold sx's: fever, cough, runny nose Concerns with glucose

## 2016-12-06 NOTE — Patient Instructions (Addendum)
Please return tomorrow for a repeat urinalysis. I would like to make sure there is no further ketones in the urine. Please continue with Insulin dosing as you have it presently. I have made a referral to Dr. Buddy Duty as you have requested. Please be sure to keep close watch of how much insulin is being used and call us if you need more or have questions about insulin dose/administration. Follow up with [place] in four weeks and please bring a glucose log.   Correction Insulin Correction insulin, also called corrective insulin or a supplemental dose, is a small amount of insulin that can be used to lower your blood sugar (glucose) if it is too high. You may be instructed to check your blood glucose at certain times of the day and to use correction insulin as needed to lower your blood glucose to your target range. Correction insulin is primarily used as part of diabetes management. It may also be prescribed for people who do not have diabetes. What is a correction scale? A correction scale, also called a sliding scale, is prescribed by your health care provider to help you determine when you need correction insulin. Your correction scale is based on your individual treatment goals, and it has two parts:  Ranges of blood glucose levels.  How much correction insulin to give yourself if your blood sugar falls within a certain range. If your blood glucose is in your desired range, you will not need correction insulin and you should take your normal insulin dose. What type of insulin do I need? Your health care provider may prescribe rapid-acting or short-acting insulin for you to use as correction insulin. Rapid-acting insulin:  Starts working quickly, in as little as 5 minutes.  Can last for 3-6 hours.  Works well when taken right before a meal to quickly lower blood glucose. Short-acting insulin:  Starts working in about 30 minutes.  Can last for 6-8 hours.  Should be taken about 30 minutes before you  start eating a meal. Talk with your health care provider or pharmacist about which type of correction insulin to take and when to take it. If you use insulin to control your diabetes, you should use correction insulin in addition to the longer-acting (basal) insulin that you normally use. How do I manage my blood glucose with correction insulin? Giving a correction dose  Check your blood glucose as directed by your health care provider.  Use your correction scale to find the range that your blood glucose is in.  Identify the units of insulin that match your blood glucose range.  Make sure you have food available that you can eat in the next 15-30 minutes, after your correction dose.  Give yourself the dose of correction insulin that your health care provider has prescribed in your correction scale. Always make sure you are using the right type of insulin.  If your correction insulin is rapid-acting, start eating a meal within 15 minutes after your correction dose to keep your blood glucose from getting too low.  If your correction insulin is short-acting, start eating a meal within 30 minutes after your correction dose to keep your blood glucose from getting too low. Keeping a blood glucose log  Write down your blood glucose test results and the amount of insulin that you give yourself. Do this every time you check blood glucose or take insulin. Bring this log with you to your medical visits. This information will help your health care provider to manage  your medicines.  Note anything that may affect your blood glucose, such as:  Changes in normal exercise or activity.  Changes in your normal schedule, such as changes in your sleep routine, going on vacation, changing your diet, or holidays.  New over-the-counter or prescription medicines.  Illness, stress, or anxiety.  Changes in the time that you took your medicine or insulin.  Changes in your meals, such as skipping a meal,  having a late meal, or dining out.  Eating things that may affect blood glucose, such as snacks, meal portions that are larger than normal, drinks that contain sugar, or eating less than usual. What do I need to know about hyperglycemia and hypoglycemia? What is hyperglycemia? Hyperglycemia, also called high blood glucose, occurs when blood glucose is too high. Make sure you know the early signs of hyperglycemia, such as:  Increased thirst.  Hunger.  Feeling very tired.  Needing to urinate more often than usual.  Blurry vision. What is hypoglycemia? Hypoglycemia is also called low blood glucose. Be aware of "stacking" your insulin doses. This happens when you correct a high blood glucose by giving yourself extra insulin too soon after a previous correction dose or mealtime dose. This may cause you to have too much insulin in your body and may put you at risk for hypoglycemia. Hypoglycemia occurs with a blood glucose level at or below 70 mg/dL (3.9 mmol/L). It is important to know the symptoms of hypoglycemia and treat it right away. Always have a 15-gram rapid-acting carbohydrate snack with you to treat low blood glucose. Family members and close friends should also know the symptoms and should understand how to treat hypoglycemia, in case you are not able to treat yourself. What are the symptoms of hypoglycemia? Hypoglycemia symptoms can include:  Hunger.  Anxiety.  Sweating and feeling clammy.  Confusion.  Dizziness or light-headedness.  Sleepiness.  Nausea.  Increased heart rate.  Headache.  Blurry vision.  Jerky movements that you cannot control (seizure).  Nightmares.  Tingling or numbness around the mouth, lips, or tongue.  A change in speech.  Decreased ability to concentrate.  A change in coordination.  Restless sleep.  Tremors or shakes.  Fainting.  Irritability. How do I treat hypoglycemia? If you are alert and able to swallow safely, follow the  15:15 rule:  Take 15 grams of a rapid-acting carbohydrate. Rapid-acting options include:  1 tube of glucose gel.  3 glucose pills.  6-8 pieces of hard candy.  4 oz (120 mL) of fruit juice.  4 oz (120 mL) of regular (not diet) soda.  Check your blood glucose 15 minutes after you take the carbohydrate.  If the repeat blood glucose level is still at or below 70 mg/dL (3.9 mmol/L), take 15 grams of a carbohydrate again.  If your blood glucose level does not increase above 70 mg/dL (3.9 mmol/L) after 3 tries, seek emergency medical care.  After your blood glucose level returns to normal, eat a meal or a snack within 1 hour. How do I treat severe hypoglycemia? Severe hypoglycemia is when your blood glucose level is at or below 54 mg/dL (3 mmol/L). Severe hypoglycemia is an emergency. Do not wait to see if the symptoms will go away. Get medical help right away. Call your local emergency services (911 in the [place] Do not drive yourself to the hospital. If you have severe hypoglycemia and you cannot eat or drink, you may need an injection of glucagon. A family member or close friend  should learn how to check your blood glucose and how to give you a glucagon injection. Ask your health care provider if you need to have an emergency glucagon injection kit available. Severe hypoglycemia may need to be treated in a hospital. The treatment may include getting glucose through an IV tube. You may also need treatment for the cause of your hypoglycemia. Why do I need correction insulin if I do not have diabetes? If you do not have diabetes, your health care provider may prescribe insulin because:  Keeping your blood glucose in the target range is important for your overall health.  You are taking medicines that cause your blood glucose to be higher than normal. Contact a health care provider if:  You develop low blood glucose that you are not able to treat yourself.  You have high blood glucose that  you are not able to correct with correction insulin.  Your blood glucose is often too low.  You used emergency glucagon to treat low blood glucose. Get help right away if:  You become unresponsive. If this happens, someone else should call emergency services (911 in the [place] right away.  Your blood glucose is lower than 54 mg/dL (3.0 mmol/L).  You become confused or you have trouble thinking clearly.  You have difficulty breathing. Summary  Correction insulin is a small amount of insulin that can be used to lower your blood sugar (glucose) if it is too high.  Talk with your health care provider or pharmacist about which type of correction insulin to take and when to take it. If you use insulin to control your diabetes, you should use correction insulin in addition to the longer-acting (basal) insulin that you normally use.  You may be instructed to check your blood glucose at certain times of the day and to use correction insulin as needed to lower your blood glucose to your target range. Always keep a log of your blood glucose values and the amount of insulin that you used.  It is important to know the symptoms of hypoglycemia and treat it right away. Always have a 15-gram rapid-acting carbohydrate snack with you to treat low blood glucose. Family members and close friends should also know the symptoms and should understand how to treat hypoglycemia, in case you are not able to treat yourself. This information is not intended to replace advice given to you by your health care provider. Make sure you discuss any questions you have with your health care provider. Document Released: 03/15/2011 Document Revised: 07/20/2016 Document Reviewed: 07/20/2016 Elsevier Interactive Patient Education  2017 [employer].

## 2016-12-07 ENCOUNTER — Other Ambulatory Visit: Payer: Medicaid Other

## 2016-12-10 ENCOUNTER — Telehealth: Payer: Self-pay | Admitting: *Deleted

## 2016-12-10 NOTE — Telephone Encounter (Signed)
Left message on voicemail to return call.

## 2016-12-13 ENCOUNTER — Telehealth: Payer: Self-pay | Admitting: Internal Medicine

## 2016-12-13 MED ORDER — GLUCOSE BLOOD VI STRP: ORAL_STRIP | 12 refills | Status: DC

## 2016-12-13 NOTE — Telephone Encounter (Signed)
Patient's mother called the office to request medication refill for patient's test strip. Please call refill to [facility] : [facility] [address].  Thank you.

## 2016-12-13 NOTE — Telephone Encounter (Signed)
Rx for test strips sent to pharmacy.

## 2016-12-14 ENCOUNTER — Other Ambulatory Visit: Payer: Self-pay | Admitting: Pharmacist

## 2016-12-14 MED ORDER — GLUCOSE BLOOD VI STRP: ORAL_STRIP | 12 refills | Status: AC

## 2017-01-13 ENCOUNTER — Encounter (HOSPITAL_COMMUNITY): Payer: Self-pay | Admitting: Emergency Medicine

## 2017-01-13 ENCOUNTER — Emergency Department (HOSPITAL_COMMUNITY)
Admission: EM | Admit: 2017-01-13 | Discharge: 2017-01-14 | Disposition: A | Discharge status: Home / Self Care | Payer: Medicaid Other | Attending: Emergency Medicine | Admitting: Emergency Medicine

## 2017-01-13 DIAGNOSIS — R739 Hyperglycemia, unspecified: Secondary | ICD-10-CM

## 2017-01-13 DIAGNOSIS — E860 Dehydration: Secondary | ICD-10-CM | POA: Diagnosis not present

## 2017-01-13 DIAGNOSIS — R112 Nausea with vomiting, unspecified: Secondary | ICD-10-CM | POA: Diagnosis present

## 2017-01-13 DIAGNOSIS — E1065 Type 1 diabetes mellitus with hyperglycemia: Secondary | ICD-10-CM | POA: Diagnosis not present

## 2017-01-13 DIAGNOSIS — Z79899 Other long term (current) drug therapy: Secondary | ICD-10-CM | POA: Insufficient documentation

## 2017-01-13 DIAGNOSIS — F909 Attention-deficit hyperactivity disorder, unspecified type: Secondary | ICD-10-CM | POA: Insufficient documentation

## 2017-01-13 LAB — CBG MONITORING, ED: GLUCOSE-CAPILLARY: 241 mg/dL — AB (ref 65–99)

## 2017-01-13 MED ORDER — SODIUM CHLORIDE 0.9 % IV BOLUS (SEPSIS)
1000.0000 mL | Freq: Once | INTRAVENOUS | Status: AC
  Administered 2017-01-14: 1000 mL via INTRAVENOUS

## 2017-01-13 MED ORDER — SODIUM CHLORIDE 0.9 % IV BOLUS (SEPSIS)
500.0000 mL | Freq: Once | INTRAVENOUS | Status: AC
  Administered 2017-01-14: 500 mL via INTRAVENOUS

## 2017-01-13 NOTE — ED Triage Notes (Signed)
Per EMS pts mother called 911 for pts blood sugar of 300. CBG via EMS was 306. Pt complaining of stomach pain and "not feeling well. Pt threw up once prior to arrival. Pts mother stated she believed the pt is in DKA. Pt took trazadone prior to arrival.   116/62  HR 76 O2 98% resp 18

## 2017-01-13 NOTE — ED Provider Notes (Signed)
[facility] DEPT Provider Note   CSN: [id number] Arrival date & time: 01/13/17  2322   By signing my name below, I, Delton Prairie, attest that this documentation has been prepared under the direction and in the presence of Rolland Porter, MD  Electronically Signed: Delton Prairie, ED Scribe. 01/13/17. 12:11 AM.  Time seen 23:17 PM   History   Chief Complaint Chief Complaint  Patient presents with  . Hyperglycemia   The history is provided by a parent. No language interpreter was used.   Level 5 caveat for mental retardation  HPI Comments:   Dylan Butler is a 21 y.o. male, with a PMHx of JODM x 16 years, who presents to the Emergency Department with parents who report acute onset, intermittent episodes of vomiting which began around 5 AM yesterday. Mother reports the pt experienced 3 episodes of vomiting yesterday and dry heaving today. She notes she checked the pt's blood sugar, which was elevated, and gave the pt his insulin which provided relief yesterday. She notes the pt was able to eat and drink normally after this episode yesterday. Mother states the pt began to feel sick again around 1 PM today. She checked the pt's blood sugar again and reports it was 50. She states the pt ate, took his insulin and went to sleep, woke up around 6 PM and ate again. She states the pt was acting lethargic which caused her to check his blood sugar again and notes it was 334 PTA. She notes his blood sugar usually ranges from 70-150. Mother notes the pt took 200 mg trazodone to help sleep PTA. Mother also reports he was complaining of generalized abdominal pain PTA and recent sick contacts (several family members have had similar). She states the pt recently seen on March 6 and had testing done by his endocrinologist which were normal (? A1C). She denies diarrhea, fevers, SOB, alcohol use or any other associated symptoms. Pt is a non-smoker. She was concerned that he was in DKA.   PCP [facility]: Dr. Buddy Duty.    Past Medical History:  Diagnosis Date  . ADHD (attention deficit hyperactivity disorder)   . Anxiety   . Bipolar 1 disorder ([place])   . Diabetes mellitus without complication ([place])   . ODD (oppositional defiant disorder)     Patient Active Problem List   Diagnosis Date Noted  . DKA (diabetic ketoacidoses) ([place]) 11/26/2016  . Tachycardia 07/31/2016  . Ketoacidosis 07/31/2016  . Controlled type 1 diabetes mellitus with hyperglycemia ([place]) 07/31/2016  . Nausea with vomiting 07/31/2016  . DKA, type 1 ([place]) 07/31/2016    Past Surgical History:  Procedure Laterality Date  . HERNIA REPAIR      Home Medications    Prior to Admission medications   Medication Sig Start Date End Date Taking? Authorizing Provider  acetaminophen (TYLENOL) 500 MG tablet Take 500 mg by mouth every 6 (six) hours as needed for headache.   Yes Historical Provider, MD  atomoxetine (STRATTERA) 60 MG capsule Take 60 mg by mouth daily.   Yes Historical Provider, MD  blood glucose meter kit and supplies KIT Dispense based on patient and insurance preference. Use up to four times daily as directed. (FOR ICD-9 250.00, 250.01). 08/03/16  Yes Theodis Blaze, MD  cetirizine (ZYRTEC) 10 MG tablet Take 1 tablet (10 mg total) by mouth daily. 11/27/16  Yes Clanford Marisa Hua, MD  fluticasone (FLONASE) 50 MCG/ACT nasal spray Place 2 sprays into both nostrils daily.  Yes Historical Provider, MD  glucose blood (GLUCOSE METER TEST) test strip Use as instructed for 4 times daily testing of blood glucose. 12/14/16  Yes Tresa Garter, MD  guanFACINE (TENEX) 2 MG tablet Take 1 tablet (2 mg total) by mouth at bedtime. Patient taking differently: Take 4 mg by mouth at bedtime.  08/03/16  Yes Theodis Blaze, MD  Insulin Glargine (LANTUS SOLOSTAR) 100 UNIT/ML Solostar Pen Inject 40 Units into the skin daily. Patient taking differently: Inject 15 Units into the skin daily.  11/27/16  Yes Clanford Marisa Hua, MD  insulin lispro (HUMALOG KWIKPEN) 100 UNIT/ML KiwkPen 15-25 Units Subcutaneous 3 times daily before meals as directed Patient taking differently: Inject 8-12 Units into the skin 3 (three) times daily. Sliding scale 11/27/16  Yes Clanford Marisa Hua, MD  Insulin Pen Needle 31G X 6 MM MISC 1 Device by Does not apply route daily at 8 pm. 11/29/16  Yes Ejiroghene E Emokpae, MD  lisdexamfetamine (VYVANSE) 50 MG capsule Take 50 mg by mouth daily.   Yes Historical Provider, MD  traZODone (DESYREL) 150 MG tablet Take 200 mg by mouth at bedtime.    Yes Historical Provider, MD  ziprasidone (GEODON) 80 MG capsule Take 160 mg by mouth at bedtime.    Yes Historical Provider, MD  ondansetron (ZOFRAN) 4 MG tablet Take 1 tablet (4 mg total) by mouth every 8 (eight) hours as needed for nausea or vomiting. 01/14/17   Rolland Porter, MD    Family History Family History  Problem Relation Age of Onset  . Diabetes Mellitus II Maternal Grandmother   . CAD Maternal Grandmother     Social History Social History  Substance Use Topics  . Smoking status: Never Smoker  . Smokeless tobacco: Never Used  . Alcohol use No  lives at home Lives with mother Mother is his legal guardian and POA   Allergies   Other   Review of Systems Review of Systems  Unable to perform ROS: Mental status change  Constitutional: Negative for fever.  Respiratory: Negative for shortness of breath.   Gastrointestinal: Positive for abdominal pain and vomiting. Negative for diarrhea.     Physical Exam Updated Vital Signs BP 107/57 (BP Location: Left Arm)   Pulse 70   Temp 97.8 F (36.6 C) (Oral)   Resp 18   SpO2 97%   Vital signs normal    Physical Exam  Constitutional: He appears well-developed and well-nourished.  Non-toxic appearance. He does not appear ill. No distress.  Sleeping. Follows some commands given by mother but does not wake up. Pt has 2 teddy bears with him. MOP states this is typical for him after  taking his bedtime medications.   HENT:  Head: Normocephalic and atraumatic.  Right Ear: External ear normal.  Left Ear: External ear normal.  Nose: Nose normal. No mucosal edema or rhinorrhea.  Mouth/Throat: Oropharynx is clear and moist and mucous membranes are normal. No dental abscesses or uvula swelling.  Head appears larger than normal  Eyes: Conjunctivae and EOM are normal. Pupils are equal, round, and reactive to light.  Neck: Normal range of motion and full passive range of motion without pain. Neck supple.  Cardiovascular: Normal rate, regular rhythm and normal heart sounds.  Exam reveals no gallop and no friction rub.   No murmur heard. Pulmonary/Chest: Effort normal and breath sounds normal. No respiratory distress. He has no wheezes. He has no rhonchi. He has no rales. He exhibits no tenderness and no  crepitus.  Abdominal: Soft. Normal appearance and bowel sounds are normal. He exhibits no distension. There is no tenderness. There is no rebound and no guarding.  Musculoskeletal: Normal range of motion. He exhibits no edema or tenderness.  Moves all extremities well.   Neurological: He has normal strength.  sleeping  Skin: Skin is warm, dry and intact. No rash noted. No erythema. No pallor.  Psychiatric:  sleeping  Nursing note and vitals reviewed.    ED Treatments / Results  Labs (all labs ordered are listed, but only abnormal results are displayed) Results for orders placed or performed during the hospital encounter of 01/13/17  Comprehensive metabolic panel  Result Value Ref Range   Sodium 136 135 - 145 mmol/L   Potassium 4.0 3.5 - 5.1 mmol/L   Chloride 102 101 - 111 mmol/L   CO2 27 22 - 32 mmol/L   Glucose, Bld 257 (H) 65 - 99 mg/dL   BUN 21 (H) 6 - 20 mg/dL   Creatinine, Ser 0.97 0.61 - 1.24 mg/dL   Calcium 8.9 8.9 - 10.3 mg/dL   Total Protein 6.6 6.5 - 8.1 g/dL   Albumin 3.9 3.5 - 5.0 g/dL   AST 16 15 - 41 U/L   ALT 16 (L) 17 - 63 U/L   Alkaline  Phosphatase 84 38 - 126 U/L   Total Bilirubin 0.5 0.3 - 1.2 mg/dL   GFR calc non Af Amer >60 >60 mL/min   GFR calc Af Amer >60 >60 mL/min   Anion gap 7 5 - 15  CBC with Differential  Result Value Ref Range   WBC 5.4 4.0 - 10.5 K/uL   RBC 5.14 4.22 - 5.81 MIL/uL   Hemoglobin 13.8 13.0 - 17.0 g/dL   HCT 40.7 39.0 - 52.0 %   MCV 79.2 78.0 - 100.0 fL   MCH 26.8 26.0 - 34.0 pg   MCHC 33.9 30.0 - 36.0 g/dL   RDW 12.4 11.5 - 15.5 %   Platelets 264 150 - 400 K/uL   Neutrophils Relative % 48 %   Neutro Abs 2.6 1.7 - 7.7 K/uL   Lymphocytes Relative 41 %   Lymphs Abs 2.2 0.7 - 4.0 K/uL   Monocytes Relative 9 %   Monocytes Absolute 0.5 0.1 - 1.0 K/uL   Eosinophils Relative 2 %   Eosinophils Absolute 0.1 0.0 - 0.7 K/uL   Basophils Relative 0 %   Basophils Absolute 0.0 0.0 - 0.1 K/uL  Urinalysis, Routine w reflex microscopic  Result Value Ref Range   Color, Urine YELLOW YELLOW   APPearance CLEAR CLEAR   Specific Gravity, Urine 1.035 (H) 1.005 - 1.030   pH 6.0 5.0 - 8.0   Glucose, UA >=500 (A) NEGATIVE mg/dL   Hgb urine dipstick NEGATIVE NEGATIVE   Bilirubin Urine NEGATIVE NEGATIVE   Ketones, ur 20 (A) NEGATIVE mg/dL   Protein, ur NEGATIVE NEGATIVE mg/dL   Nitrite NEGATIVE NEGATIVE   Leukocytes, UA NEGATIVE NEGATIVE   RBC / HPF 0-5 0 - 5 RBC/hpf   WBC, UA 0-5 0 - 5 WBC/hpf   Bacteria, UA NONE SEEN NONE SEEN   Squamous Epithelial / LPF NONE SEEN NONE SEEN  CBG monitoring, ED  Result Value Ref Range   Glucose-Capillary 241 (H) 65 - 99 mg/dL  CBG monitoring, ED  Result Value Ref Range   Glucose-Capillary 195 (H) 65 - 99 mg/dL   Comment 1 Notify RN    Laboratory interpretation all normal except hyperglycemia without acidosis, concentrated  UA, glucosuria     EKG  EKG Interpretation None       Radiology No results found.  Procedures Procedures (including critical care time)  Medications Ordered in ED Medications  sodium chloride 0.9 % bolus 1,000 mL (0 mLs Intravenous  Stopped 01/14/17 0055)  sodium chloride 0.9 % bolus 500 mL (0 mLs Intravenous Stopped 01/14/17 0048)  sodium chloride 0.9 % bolus 1,000 mL (1,000 mLs Intravenous New Bag/Given 01/14/17 0207)     Initial Impression / Assessment and Plan / ED Course  I have reviewed the triage vital signs and the nursing notes.  Pertinent labs & imaging results that were available during my care of the patient were reviewed by me and considered in my medical decision making (see chart for details).     DIAGNOSTIC STUDIES:  Oxygen Saturation is 97% on RA, normal by my interpretation.    COORDINATION OF CARE:  11:39 PM Discussed treatment plan with pt at bedside and pt agreed to plan. He was given IV fluids. Will monitor his CBG.   1:32 AM Discussed pt's lab work with parents and plan of discharge. They should continue to monitor his CBG closely and use his sliding scale. He was prescribed zofran for nausea and vomiting.   Final Clinical Impressions(s) / ED Diagnoses   Final diagnoses:  Non-intractable vomiting with nausea, unspecified vomiting type  Hyperglycemia  Dehydration    New Prescriptions New Prescriptions   ONDANSETRON (ZOFRAN) 4 MG TABLET    Take 1 tablet (4 mg total) by mouth every 8 (eight) hours as needed for nausea or vomiting.   Plan discharge  Rolland Porter, MD, FACEP   I personally performed the services described in this documentation, which was scribed in my presence. The recorded information has been reviewed and considered.  Rolland Porter, MD, Barbette Or, MD 01/14/17 [phone]

## 2017-01-14 LAB — COMPREHENSIVE METABOLIC PANEL
ALBUMIN: 3.9 g/dL (ref 3.5–5.0)
ALT: 16 U/L — ABNORMAL LOW (ref 17–63)
AST: 16 U/L (ref 15–41)
Alkaline Phosphatase: 84 U/L (ref 38–126)
Anion gap: 7 (ref 5–15)
BILIRUBIN TOTAL: 0.5 mg/dL (ref 0.3–1.2)
BUN: 21 mg/dL — ABNORMAL HIGH (ref 6–20)
CO2: 27 mmol/L (ref 22–32)
Calcium: 8.9 mg/dL (ref 8.9–10.3)
Chloride: 102 mmol/L (ref 101–111)
Creatinine, Ser: 0.97 mg/dL (ref 0.61–1.24)
GFR calc Af Amer: >60 mL/min (ref >60)
GFR calc non Af Amer: >60 mL/min (ref >60)
GLUCOSE: 257 mg/dL — AB (ref 65–99)
POTASSIUM: 4 mmol/L (ref 3.5–5.1)
SODIUM: 136 mmol/L (ref 135–145)
TOTAL PROTEIN: 6.6 g/dL (ref 6.5–8.1)

## 2017-01-14 LAB — URINALYSIS, ROUTINE W REFLEX MICROSCOPIC
BACTERIA UA: NONE SEEN
Bilirubin Urine: NEGATIVE
Glucose, UA: >=500 mg/dL — AB
Hgb urine dipstick: NEGATIVE
KETONES UR: 20 mg/dL — AB
LEUKOCYTES UA: NEGATIVE
Nitrite: NEGATIVE
PROTEIN: NEGATIVE mg/dL
SQUAMOUS EPITHELIAL / LPF: NONE SEEN
Specific Gravity, Urine: 1.035 — ABNORMAL HIGH (ref 1.005–1.030)
pH: 6 (ref 5.0–8.0)

## 2017-01-14 LAB — CBG MONITORING, ED: GLUCOSE-CAPILLARY: 195 mg/dL — AB (ref 65–99)

## 2017-01-14 LAB — CBC WITH DIFFERENTIAL/PLATELET
BASOS ABS: 0 10*3/uL (ref 0.0–0.1)
Basophils Relative: 0 %
Eosinophils Absolute: 0.1 10*3/uL (ref 0.0–0.7)
Eosinophils Relative: 2 %
HEMATOCRIT: 40.7 % (ref 39.0–52.0)
Hemoglobin: 13.8 g/dL (ref 13.0–17.0)
Lymphocytes Relative: 41 %
Lymphs Abs: 2.2 10*3/uL (ref 0.7–4.0)
MCH: 26.8 pg (ref 26.0–34.0)
MCHC: 33.9 g/dL (ref 30.0–36.0)
MCV: 79.2 fL (ref 78.0–100.0)
MONO ABS: 0.5 10*3/uL (ref 0.1–1.0)
Monocytes Relative: 9 %
Neutro Abs: 2.6 10*3/uL (ref 1.7–7.7)
Neutrophils Relative %: 48 %
Platelets: 264 10*3/uL (ref 150–400)
RBC: 5.14 MIL/uL (ref 4.22–5.81)
RDW: 12.4 % (ref 11.5–15.5)
WBC: 5.4 10*3/uL (ref 4.0–10.5)

## 2017-01-14 MED ORDER — ONDANSETRON HCL 4 MG PO TABS
4.0000 mg | ORAL_TABLET | Freq: Three times a day (TID) | ORAL | 0 refills | Status: DC | PRN
Start: 2017-01-14 — End: 2017-07-11

## 2017-01-14 MED ORDER — SODIUM CHLORIDE 0.9 % IV BOLUS (SEPSIS)
1000.0000 mL | Freq: Once | INTRAVENOUS | Status: AC
  Administered 2017-01-14: 1000 mL via INTRAVENOUS

## 2017-01-14 NOTE — Discharge Instructions (Signed)
Drink plenty of fluids (clear liquids) then start a bland diet later this morning such as toast, crackers, jello, [employer] chicken noodle soup. Use the zofran for nausea or vomiting. Continue to monitor his blood sugar closely and use your sliding scale. Recheck if he gets dehydrated again or if he seems worse.

## 2017-01-29 ENCOUNTER — Encounter (HOSPITAL_COMMUNITY): Payer: Self-pay

## 2017-01-29 ENCOUNTER — Emergency Department (HOSPITAL_COMMUNITY)
Admission: EM | Admit: 2017-01-29 | Discharge: 2017-02-01 | Disposition: A | Discharge status: Home / Self Care | Payer: Medicaid Other | Attending: Emergency Medicine | Admitting: Emergency Medicine

## 2017-01-29 DIAGNOSIS — R45851 Suicidal ideations: Secondary | ICD-10-CM

## 2017-01-29 DIAGNOSIS — E109 Type 1 diabetes mellitus without complications: Secondary | ICD-10-CM | POA: Diagnosis not present

## 2017-01-29 DIAGNOSIS — Z79899 Other long term (current) drug therapy: Secondary | ICD-10-CM | POA: Diagnosis not present

## 2017-01-29 DIAGNOSIS — F333 Major depressive disorder, recurrent, severe with psychotic symptoms: Secondary | ICD-10-CM | POA: Diagnosis not present

## 2017-01-29 DIAGNOSIS — Z794 Long term (current) use of insulin: Secondary | ICD-10-CM | POA: Insufficient documentation

## 2017-01-29 DIAGNOSIS — F909 Attention-deficit hyperactivity disorder, unspecified type: Secondary | ICD-10-CM | POA: Diagnosis not present

## 2017-01-29 HISTORY — DX: Dissociative identity disorder: F44.81

## 2017-01-29 HISTORY — DX: Schizophrenia, unspecified: F20.9

## 2017-01-29 LAB — SALICYLATE LEVEL

## 2017-01-29 LAB — CBC
HCT: 45.6 % (ref 39.0–52.0)
Hemoglobin: 15.7 g/dL (ref 13.0–17.0)
MCH: 27.1 pg (ref 26.0–34.0)
MCHC: 34.4 g/dL (ref 30.0–36.0)
MCV: 78.6 fL (ref 78.0–100.0)
Platelets: 288 10*3/uL (ref 150–400)
RBC: 5.8 MIL/uL (ref 4.22–5.81)
RDW: 12.4 % (ref 11.5–15.5)
WBC: 6.2 10*3/uL (ref 4.0–10.5)

## 2017-01-29 LAB — CBG MONITORING, ED
GLUCOSE-CAPILLARY: 193 mg/dL — AB (ref 65–99)
GLUCOSE-CAPILLARY: 135 mg/dL — AB (ref 65–99)

## 2017-01-29 LAB — RAPID URINE DRUG SCREEN, HOSP PERFORMED
Amphetamines: POSITIVE — AB
Barbiturates: NOT DETECTED
Benzodiazepines: NOT DETECTED
Cocaine: NOT DETECTED
OPIATES: NOT DETECTED
Tetrahydrocannabinol: NOT DETECTED

## 2017-01-29 LAB — COMPREHENSIVE METABOLIC PANEL
ALK PHOS: 127 U/L — AB (ref 38–126)
ALT: 16 U/L — ABNORMAL LOW (ref 17–63)
AST: 20 U/L (ref 15–41)
Albumin: 4.7 g/dL (ref 3.5–5.0)
Anion gap: 10 (ref 5–15)
BILIRUBIN TOTAL: 0.8 mg/dL (ref 0.3–1.2)
BUN: 14 mg/dL (ref 6–20)
CALCIUM: 10.2 mg/dL (ref 8.9–10.3)
CHLORIDE: 105 mmol/L (ref 101–111)
CO2: 23 mmol/L (ref 22–32)
CREATININE: 0.83 mg/dL (ref 0.61–1.24)
Glucose, Bld: 213 mg/dL — ABNORMAL HIGH (ref 65–99)
Potassium: 3.7 mmol/L (ref 3.5–5.1)
Sodium: 138 mmol/L (ref 135–145)
TOTAL PROTEIN: 8.4 g/dL — AB (ref 6.5–8.1)

## 2017-01-29 LAB — ACETAMINOPHEN LEVEL: Acetaminophen (Tylenol), Serum: <10 ug/mL — ABNORMAL LOW (ref 10–30)

## 2017-01-29 LAB — ETHANOL

## 2017-01-29 MED ORDER — ONDANSETRON HCL 4 MG PO TABS: 4.0000 mg | ORAL_TABLET | Freq: Three times a day (TID) | ORAL | Status: DC | PRN

## 2017-01-29 MED ORDER — ACETAMINOPHEN 500 MG PO TABS: 500.0000 mg | ORAL_TABLET | Freq: Four times a day (QID) | ORAL | Status: DC | PRN

## 2017-01-29 MED ORDER — GUANFACINE HCL 2 MG PO TABS
4.0000 mg | ORAL_TABLET | Freq: Every day | ORAL | Status: DC
  Administered 2017-01-29 – 2017-01-31 (×3): 4 mg via ORAL
  Filled 2017-01-29 (×4): qty 2

## 2017-01-29 MED ORDER — INSULIN ASPART 100 UNIT/ML ~~LOC~~ SOLN: 8.0000 [IU] | Freq: Three times a day (TID) | SUBCUTANEOUS | Status: DC

## 2017-01-29 MED ORDER — ZIPRASIDONE HCL 20 MG PO CAPS
160.0000 mg | ORAL_CAPSULE | Freq: Every day | ORAL | Status: DC
  Administered 2017-01-29: 160 mg via ORAL
  Filled 2017-01-29: qty 8

## 2017-01-29 MED ORDER — TRAZODONE HCL 100 MG PO TABS
200.0000 mg | ORAL_TABLET | Freq: Every day | ORAL | Status: DC
  Administered 2017-01-29 – 2017-01-31 (×3): 200 mg via ORAL
  Filled 2017-01-29 (×3): qty 2

## 2017-01-29 MED ORDER — INSULIN GLARGINE 100 UNIT/ML ~~LOC~~ SOLN
15.0000 [IU] | Freq: Every day | SUBCUTANEOUS | Status: DC
  Administered 2017-01-30 – 2017-02-01 (×3): 15 [IU] via SUBCUTANEOUS
  Filled 2017-01-29 (×3): qty 0.15

## 2017-01-29 MED ORDER — ATOMOXETINE HCL 60 MG PO CAPS
60.0000 mg | ORAL_CAPSULE | Freq: Every day | ORAL | Status: DC
  Filled 2017-01-29: qty 1

## 2017-01-29 MED ORDER — INSULIN LISPRO 100 UNIT/ML (KWIKPEN): 8.0000 [IU] | PEN_INJECTOR | Freq: Three times a day (TID) | SUBCUTANEOUS | Status: DC

## 2017-01-29 NOTE — ED Triage Notes (Signed)
Patient brought in by [employer]. Patient's mother called [employer] because the patient voiced that he wanted to kill himself. When [employer] arrived to the patient's house he was locked in the closet and once removed from the closet, he was combative. Patient is currently handcuffed to the stretcher chair.

## 2017-01-29 NOTE — ED Notes (Signed)
Pt brought in by [employer], pt has been reported that he wants to kill him self, per [employer] pt has a mind set of an 21 year old as per pt mother. The onset occurred as per [employer] as pt mother would not allow him to play his video games.  Pt at this time is calm and laying in bed and is being cooperative. Per [employer] awaiting for MD to take IVC documents out on pt.

## 2017-01-29 NOTE — ED Notes (Signed)
PT wanded and belongs secured in locker

## 2017-01-29 NOTE — Progress Notes (Signed)
Per Donell SievertSpencer, Simon PA meets inpatient criteria Elsie LincolnShean K. Sherlon HandingHarris, LCAS-A, LPC-A, [facility]  Counselor 01/29/2017 9:17 PM

## 2017-01-29 NOTE — ED Notes (Signed)
Pt has been seen and wand by security.  PT has a bag of belongings.
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
## 2017-01-29 NOTE — ED Notes (Addendum)
Pt asked why he came and stated that he is suicidal stating " I have given up on the world"  Pt states that he planned to jump off of a bridge. Pt also reports that he lost his fiancee a few years ago and is still upset about it. Pt also reports "a lot of family stuff" has made him feel this way. Pt does report having auditory hallucinations that at times agree with his self to kill himself, and does report having Visual hallucinations that he states are "Sharyn CreamerGhost" that he sees mostly when he is in the hospital. Pt is calm cooperative, and sha a stuttered speech.

## 2017-01-29 NOTE — ED Triage Notes (Signed)
Patient states, "My other personality is suicidal. The other person wants to jump off of the bridge." "I tried to talk him out of it." Patient denies homicidal thoughts. Patient states, "I am resisting my other personalities." Patient states he has been hearing voices today and they have been saying "kill yourself."

## 2017-01-30 DIAGNOSIS — F333 Major depressive disorder, recurrent, severe with psychotic symptoms: Secondary | ICD-10-CM | POA: Diagnosis present

## 2017-01-30 DIAGNOSIS — R45851 Suicidal ideations: Secondary | ICD-10-CM

## 2017-01-30 LAB — CBG MONITORING, ED
GLUCOSE-CAPILLARY: 241 mg/dL — AB (ref 65–99)
GLUCOSE-CAPILLARY: 359 mg/dL — AB (ref 65–99)
GLUCOSE-CAPILLARY: 299 mg/dL — AB (ref 65–99)
Glucose-Capillary: 225 mg/dL — ABNORMAL HIGH (ref 65–99)

## 2017-01-30 MED ORDER — INSULIN ASPART 100 UNIT/ML ~~LOC~~ SOLN
0.0000 [IU] | Freq: Three times a day (TID) | SUBCUTANEOUS | Status: DC
  Administered 2017-01-30: 5 [IU] via SUBCUTANEOUS
  Administered 2017-01-30: 15 [IU] via SUBCUTANEOUS
  Administered 2017-01-31: 3 [IU] via SUBCUTANEOUS
  Administered 2017-01-31: 2 [IU] via SUBCUTANEOUS
  Administered 2017-01-31: 15 [IU] via SUBCUTANEOUS
  Administered 2017-02-01: 11 [IU] via SUBCUTANEOUS
  Filled 2017-01-30 (×5): qty 1

## 2017-01-30 MED ORDER — INSULIN ASPART 100 UNIT/ML ~~LOC~~ SOLN
0.0000 [IU] | Freq: Every day | SUBCUTANEOUS | Status: DC
Start: 2017-01-30 — End: 2017-02-01

## 2017-01-30 MED ORDER — LORAZEPAM 1 MG PO TABS
1.0000 mg | ORAL_TABLET | Freq: Four times a day (QID) | ORAL | Status: DC | PRN
  Administered 2017-01-30: 1 mg via ORAL
  Filled 2017-01-30: qty 1

## 2017-01-30 MED ORDER — ZIPRASIDONE HCL 60 MG PO CAPS
120.0000 mg | ORAL_CAPSULE | Freq: Every day | ORAL | Status: DC
  Administered 2017-01-30 – 2017-01-31 (×2): 120 mg via ORAL
  Filled 2017-01-30: qty 2
  Filled 2017-01-30: qty 6
  Filled 2017-01-30: qty 2
  Filled 2017-01-30: qty 6

## 2017-01-30 MED ORDER — AMANTADINE HCL 100 MG PO CAPS
100.0000 mg | ORAL_CAPSULE | Freq: Every day | ORAL | Status: DC
  Administered 2017-01-30 – 2017-01-31 (×2): 100 mg via ORAL
  Filled 2017-01-30 (×2): qty 1

## 2017-01-30 MED ORDER — INSULIN ASPART 100 UNIT/ML ~~LOC~~ SOLN: 4.0000 [IU] | Freq: Three times a day (TID) | SUBCUTANEOUS | Status: DC
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
## 2017-01-30 NOTE — ED Notes (Signed)
Mom visiting with patient.  Dylan Butler with social work contacted to obtain paper work that mother brought on patient.

## 2017-01-30 NOTE — BH Assessment (Addendum)
[facility] Assessment Progress Note  At 13:30 this Clinical research associatewriter called pt's mother, Dylan Butler [phone]).  She confirms that she is pt's guardian.  She notes that she obtained guardianship in [place], and has not yet had this transferred to [employer].  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  She reports that she has documentation supporting this.  She will be visiting the pt later today, and agrees to bring both documents with her.  Pt's nurse, Aram BeechamCynthia, has been notified, and agrees to call this Clinical research associatewriter when the mother arrives with the documents.  Dylan Canninghomas Tabathia Knoche, MA Triage Specialist [phone]   Addendum:  Ms Delsa SaleCarper has produced a copy of pt's Child Psychological Evaluation.  Please note that it indicates a Full Scale IQ of 59.  Dylan Canninghomas Elynor Kallenberger, MA Triage Specialist [phone]

## 2017-01-30 NOTE — ED Notes (Signed)
Mother who is patient's guardian called to check on him and asked if someone would call her with patient's disposition today. [phone]

## 2017-01-30 NOTE — ED Notes (Signed)
Pharmacy and MD notified about insulin order.  MD to order a sliding scale.

## 2017-01-30 NOTE — ED Notes (Signed)
CBG 80. Rechecked due to results not crossing over into computer system.

## 2017-01-30 NOTE — ED Notes (Signed)
Patient talking to mother on the phone.  Mother asking about whether patient will be discharged or not.  Mom to come visit later on.  Mom told that doctor wanted to continue to monitor patient and his medications in the hospital.

## 2017-01-30 NOTE — Progress Notes (Signed)
CSW received pt's psychometric testing and status of pt's family's income for [place] Summary Statement of Income and Resources from pt's RN who obtained it from family.  Family stated to R.N., "This is all I could find".  CSW was informed by disposition pt's referral to [facility] would require the psychometric testing, as well as other information not provided by family members.  CSW faxed referral to [facility] with psychometric testing results included on 3/28.  Dorothe PeaJonathan F. Kennth Vanbenschoten, Theresia MajorsLCSWA, LCAS Clinical Social Worker Ph: [phone]

## 2017-01-30 NOTE — ED Notes (Signed)
Pts blood sugar was 142.

## 2017-01-31 DIAGNOSIS — R45851 Suicidal ideations: Secondary | ICD-10-CM | POA: Diagnosis not present

## 2017-01-31 DIAGNOSIS — F333 Major depressive disorder, recurrent, severe with psychotic symptoms: Secondary | ICD-10-CM | POA: Diagnosis not present

## 2017-01-31 LAB — CBG MONITORING, ED
Glucose-Capillary: 142 mg/dL — ABNORMAL HIGH (ref 65–99)
Glucose-Capillary: 80 mg/dL (ref 65–99)
Glucose-Capillary: 144 mg/dL — ABNORMAL HIGH (ref 65–99)
Glucose-Capillary: 295 mg/dL — ABNORMAL HIGH (ref 65–99)
Glucose-Capillary: 358 mg/dL — ABNORMAL HIGH (ref 65–99)
Glucose-Capillary: 97 mg/dL (ref 65–99)

## 2017-01-31 NOTE — ED Notes (Signed)
Dylan Butler stated her family believes in ghost and that is why Dylan Butler states he sees ghost.    Dylan Butler would like someone to call and speak with her.  [phone]

## 2017-01-31 NOTE — Progress Notes (Signed)
Inpatient Diabetes Program Recommendations  AACE/ADA: New Consensus Statement on Inpatient Glycemic Control (2015)  Target Ranges:  Prepandial:   less than 140 mg/dL      Peak postprandial:   less than 180 mg/dL (1-2 hours)      Critically ill patients:  140 - 180 mg/dL   Results for Dylan FergusonODD, Dsean (MRN [id number]) as of 01/31/2017 11:17  Ref. Range 01/30/2017 08:07 01/30/2017 12:37 01/30/2017 14:46 01/30/2017 18:32 01/30/2017 21:25 01/30/2017 22:50 01/31/2017 08:06  Glucose-Capillary Latest Ref Range: 65 - 99 mg/dL 478241 (H) 295359 (H) 621299 (H) 225 (H) 142 (H) 80 144 (H)   Review of Glycemic Control  Diabetes history: DM1 Outpatient Diabetes medications: Lantus 20 units daily, Humalog 8-12 units TID with meals Current orders for Inpatient glycemic control: Lantus 15 units daily, Novolog 0-15 units TID with meals, Novolog 0-5 units QHS  Inpatient Diabetes Program Recommendations: Correction (SSI): Please consider decreasing Novolog correction to sensitive scale (0-9 units). Insulin - Meal Coverage: If patient is eating at least 50% of meal, please consider ordering Novolog 4 units TID with meals for meal coverage (in addition to Novolog senstive correction scale).  NOTE: Per chart review, patient has Type 1 DM which means patient will require insulin for basal, correction, and meal coverage. Patient's with DM1 are usually sensitive to insulin. Recommend decreasing Novolog correction scale to sensitive scale and ordering Novolog 4 units TID with meals for meal coverage if patient consumes at least 50% of meal.  Thanks, Orlando PennerMarie Nellene Courtois, RN, MSN, CDE Diabetes Coordinator Inpatient Diabetes Program [phone] (Team Pager from 8am to 5pm)

## 2017-01-31 NOTE — BH Assessment (Signed)
BHH Assessment Progress Note  This patient's mother, April Carper, [facility] that this Clinical research associatewriter requested yesterday when I spoke to her by telephone.  Among these is a Child Psychological Evaluation.  Ms Delsa SaleCarper also brought a Academic librarianummary Statement of Income and Resources.  I have examined pt's paper chart and have thus far been unable to find documents supporting Ms Carper's claim that she is pt's guardian.  At 12:30 I called her and spoke to her.  She reports that because of her recent move from [place] to [place], she has been unable to locate the necessary documents, but will continue to look for them, and will bring them to the ED when she finds them.  Per Thedore MinsMojeed Akintayo, MD, this pt continues to require psychiatric hospitalization, and he remains under IVC.  At 11:50 I called [facility].  They confirm receipt of pt's referral documents.  They report that they will be reviewing them with their physician, and that they currently have available beds.  However, the milieu on their IDD unit is currently very volatile, and it is uncertain whether they will be able to accept new patients at this time.  Pt's final disposition is currently pending.  Doylene Canninghomas Marie Borowski, MA Triage Specialist [phone]

## 2017-01-31 NOTE — ED Notes (Signed)
Pt stated "we moved here from [place] in September.  My brother was working here."

## 2017-01-31 NOTE — ED Notes (Signed)
[phone] Call home on dads number
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
## 2017-02-01 DIAGNOSIS — F333 Major depressive disorder, recurrent, severe with psychotic symptoms: Secondary | ICD-10-CM | POA: Diagnosis not present

## 2017-02-01 DIAGNOSIS — Z79899 Other long term (current) drug therapy: Secondary | ICD-10-CM | POA: Diagnosis not present

## 2017-02-01 DIAGNOSIS — Z794 Long term (current) use of insulin: Secondary | ICD-10-CM

## 2017-02-01 LAB — CBG MONITORING, ED: Glucose-Capillary: 304 mg/dL — ABNORMAL HIGH (ref 65–99)

## 2017-02-01 MED ORDER — GUANFACINE HCL 2 MG PO TABS: 4.0000 mg | ORAL_TABLET | Freq: Every day | ORAL | 0 refills | Status: DC

## 2017-02-01 MED ORDER — ZIPRASIDONE HCL 60 MG PO CAPS: 120.0000 mg | ORAL_CAPSULE | Freq: Every day | ORAL | 0 refills | Status: DC

## 2017-02-01 MED ORDER — TRAZODONE HCL 150 MG PO TABS: 225.0000 mg | ORAL_TABLET | Freq: Every day | ORAL | 0 refills | Status: DC

## 2017-02-01 MED ORDER — ATOMOXETINE HCL 60 MG PO CAPS: 60.0000 mg | ORAL_CAPSULE | Freq: Every day | ORAL | 0 refills | Status: DC

## 2017-02-01 MED ORDER — AMANTADINE HCL 100 MG PO CAPS: 100.0000 mg | ORAL_CAPSULE | Freq: Every day | ORAL | 0 refills | Status: DC

## 2017-02-01 NOTE — BH Assessment (Signed)
[facility] Assessment Progress Note  Per Thedore Mins, MD, this pt does not require psychiatric hospitalization at this time.  Pt presents under IVC initiated by EDP Arby Barrette, MD, which Dr Jannifer Franklin has rescinded.  Pt is to be discharged from [facility].  Pt's mother already has follow up arranged for patient.  Pt's nurse, Carlisle Beers, has been notified.  Doylene Canning, MA Triage Specialist [phone]

## 2017-02-01 NOTE — BHH Suicide Risk Assessment (Signed)
Suicide Risk Assessment  Discharge Assessment   [facility] Discharge Suicide Risk Assessment   Principal Problem: Major depressive disorder, recurrent episode, severe, with psychosis ([facility]) Discharge Diagnoses:  Patient Active Problem List   Diagnosis Date Noted  . Major depressive disorder, recurrent episode, severe, with psychosis ([facility]) [F33.3] 01/30/2017    Priority: High  . DKA (diabetic ketoacidoses) ([facility]) [E13.10] 11/26/2016  . Tachycardia [R00.0] 07/31/2016  . Ketoacidosis [E87.2] 07/31/2016  . Controlled type 1 diabetes mellitus with hyperglycemia (H[facility]) [E10.65] 07/31/2016  . Nausea with vomiting [R11.2] 07/31/2016  . DKA, type 1 ([facility]) [E10.10] 07/31/2016    Total Time spent with patient: 30 minutes  Musculoskeletal: Strength & Muscle Tone: within normal limits Gait & Station: normal Patient leans: N/A  Psychiatric Specialty Exam: Physical Exam  Constitutional: He is oriented to person, place, and time. He appears well-developed and well-nourished.  HENT:  Head: Normocephalic.  Neck: Normal range of motion.  Respiratory: Effort normal.  Musculoskeletal: Normal range of motion.  Neurological: He is alert and oriented to person, place, and time.  Psychiatric: He has a normal mood and affect. His speech is normal and behavior is normal. Judgment and thought content normal. Cognition and memory are normal.    Review of Systems  All other systems reviewed and are negative.   Blood pressure (!) 88/43, pulse (!) 55, temperature 97.7 F (36.5 C), temperature source Oral, resp. rate 14, height  (1.575 m), weight 65.8 kg (145 lb), SpO2 97 %.Body mass index is 26.52 kg/m.  General Appearance: Casual  Eye Contact:  Good  Speech:  Normal Rate  Volume:  Normal  Mood:  Euthymic  Affect:  Congruent  Thought Process:  Coherent and Descriptions of Associations: Intact  Orientation:  Full (Time, Place, and Person)  Thought Content:  WDL and Logical  Suicidal Thoughts:  No   Homicidal Thoughts:  No  Memory:  Immediate;   Fair Recent;   Fair Remote;   Fair  Judgement:  Fair  Insight:  Fair  Psychomotor Activity:  Normal  Concentration:  Concentration: Good and Attention Span: Good  Recall:  [employer] of Knowledge:  Fair  Language:  Good  Akathisia:  No  Handed:  Right  AIMS (if indicated):     Assets:  Housing Leisure Time Physical Health Resilience Social Support  ADL's:  Intact  Cognition:  WNL  Sleep:       Mental Status Per Nursing Assessment::   On Admission:   depression with suicidal ideations  Demographic Factors:  Male and Caucasian  Loss Factors: NA  Historical Factors: NA  Risk Reduction Factors:   Sense of responsibility to family, Living with another person, especially a relative, Positive social support and Positive therapeutic relationship  Continued Clinical Symptoms:  None  Cognitive Features That Contribute To Risk:  None    Suicide Risk:  Minimal: No identifiable suicidal ideation.  Patients presenting with no risk factors but with morbid ruminations; may be classified as minimal risk based on the severity of the depressive symptoms    Plan Of Care/Follow-up recommendations:  Activity:  as tolerated Diet:  heart healthy diet  Ryenne Lynam, NP 02/01/2017, 10:32 AM

## 2017-02-01 NOTE — Consult Note (Signed)
[facility] Face-to-Face Psychiatry Consult   Reason for Consult:  Depression with suicidal ideations Referring Physician:  EDP Patient Identification: Dylan Butler MRN:  [id number] Principal Diagnosis: Major depressive disorder, recurrent episode, severe, with psychosis ([place]) Diagnosis:   Patient Active Problem List   Diagnosis Date Noted  . Major depressive disorder, recurrent episode, severe, with psychosis ([place]) [F33.3] 01/30/2017    Priority: High  . DKA (diabetic ketoacidoses) ([place]) [E13.10] 11/26/2016  . Tachycardia [R00.0] 07/31/2016  . Ketoacidosis [E87.2] 07/31/2016  . Controlled type 1 diabetes mellitus with hyperglycemia ([place]) [E10.65] 07/31/2016  . Nausea with vomiting [R11.2] 07/31/2016  . DKA, type 1 ([place]) [E10.10] 07/31/2016    Total Time spent with patient: 30 minutes  Subjective:   Dylan Butler is a 21 y.o. male patient has stabilized.  HPI:  21 yo male who presented to the ED with suicidal ideations.  He was started on medications and became stable.  No suicidal/homicidal ideations, hallucinations, and alcohol/drug abuse.  His mother is at his bedside and has already set up a therapist 1-[redacted] week along with psychiatric appointments.  She and he want to go home.  Stable for discharge.  Past Psychiatric History: depression  Risk to Self: None Risk to Others: Homicidal Ideation: No Thoughts of Harm to Others: No Current Homicidal Intent: No Current Homicidal Plan: No Access to Homicidal Means: No Identified Victim: none History of harm to others?: No Assessment of Violence: On admission Violent Behavior Description: aggressive with [employer] Does patient have access to weapons?: No Criminal Charges Pending?: No Does patient have a court date: No Prior Inpatient Therapy: Prior Inpatient Therapy: No Prior Therapy Dates: n/a Prior Therapy Facilty/Provider(s): n/a Reason for Treatment: n/a Prior Outpatient Therapy: Prior Outpatient Therapy: No Prior Therapy Dates: n/a Prior  Therapy Facilty/Provider(s): n/a Reason for Treatment: n/a Does patient have an ACCT team?: Unknown Does patient have Intensive In-House Services?  : Unknown Does patient have [facility] services? : Unknown Does patient have P4CC services?: Unknown  Past Medical History:  Past Medical History:  Diagnosis Date  . ADHD (attention deficit hyperactivity disorder)   . Anxiety   . Bipolar 1 disorder ([place])   . Diabetes mellitus without complication ([place])   . 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 ODD (oppositional defiant disorder)   . Schizophrenia [facility])     Past Surgical History:  Procedure Laterality Date  . HERNIA REPAIR     Family History:  Family History  Problem Relation Age of Onset  . Diabetes Mellitus II Maternal Grandmother   . CAD Maternal Grandmother   . Seizures Mother    Family Psychiatric  History: unknown Social History:  History  Alcohol Use No     History  Drug Use No    Social History   Social History  . Marital status: Single    Spouse name: N/A  . Number of children: N/A  . Years of education: N/A   Social History Main Topics  . Smoking status: Never Smoker  . Smokeless tobacco: Never Used  . Alcohol use No  . Drug use: No  . Sexual activity: Not Asked   Other Topics Concern  . None   Social History Narrative  . None   Additional Social History:    Allergies:   Allergies  Allergen Reactions  . Other Shortness Of Breath and Nausea And Vomiting    Honey Mustard    Labs:  Results for orders placed or performed during the  hospital encounter of 01/29/17 (from the past 48 hour(s))  CBG monitoring, ED     Status: Abnormal   Collection Time: 01/30/17 12:37 PM  Result Value Ref Range   Glucose-Capillary 359 (H) 65 - 99 mg/dL  CBG monitoring, ED     Status: Abnormal   Collection Time: 01/30/17  2:46 PM  Result Value Ref Range   Glucose-Capillary 299 (H) 65 - 99 mg/dL  CBG monitoring, ED     Status:  Abnormal   Collection Time: 01/30/17  6:32 PM  Result Value Ref Range   Glucose-Capillary 225 (H) 65 - 99 mg/dL   Comment 1 Notify RN    Comment 2 Document in Chart   CBG monitoring, ED     Status: Abnormal   Collection Time: 01/30/17  9:25 PM  Result Value Ref Range   Glucose-Capillary 142 (H) 65 - 99 mg/dL  CBG monitoring, ED     Status: None   Collection Time: 01/30/17 10:50 PM  Result Value Ref Range   Glucose-Capillary 80 65 - 99 mg/dL  CBG monitoring, ED     Status: Abnormal   Collection Time: 01/31/17  8:06 AM  Result Value Ref Range   Glucose-Capillary 144 (H) 65 - 99 mg/dL  CBG monitoring, ED     Status: Abnormal   Collection Time: 01/31/17 12:53 PM  Result Value Ref Range   Glucose-Capillary 295 (H) 65 - 99 mg/dL  CBG monitoring, ED     Status: Abnormal   Collection Time: 01/31/17  4:29 PM  Result Value Ref Range   Glucose-Capillary 358 (H) 65 - 99 mg/dL   Comment 1 Notify RN   CBG monitoring, ED     Status: None   Collection Time: 01/31/17 10:01 PM  Result Value Ref Range   Glucose-Capillary 97 65 - 99 mg/dL  CBG monitoring, ED     Status: Abnormal   Collection Time: 02/01/17  7:55 AM  Result Value Ref Range   Glucose-Capillary 304 (H) 65 - 99 mg/dL   Comment 1 Notify RN    Comment 2 Document in Chart     Current Facility-Administered Medications  Medication Dose Route Frequency Provider Last Rate Last Dose  . acetaminophen (TYLENOL) tablet 500 mg  500 mg Oral Q6H PRN Charlesetta Shanks, MD      . amantadine (SYMMETREL) capsule 100 mg  100 mg Oral QHS Corena Pilgrim, MD   100 mg at 01/31/17 2228  . guanFACINE (TENEX) tablet 4 mg  4 mg Oral QHS Charlesetta Shanks, MD   4 mg at 01/31/17 2227  . insulin aspart (novoLOG) injection 0-15 Units  0-15 Units Subcutaneous TID WC Gareth Morgan, MD   11 Units at 02/01/17 [phone]  . insulin aspart (novoLOG) injection 0-5 Units  0-5 Units Subcutaneous QHS Gareth Morgan, MD      . insulin glargine (LANTUS) injection 15 Units  15  Units Subcutaneous Daily Charlesetta Shanks, MD   15 Units at 01/31/17 1047  . ondansetron (ZOFRAN) tablet 4 mg  4 mg Oral Q8H PRN Charlesetta Shanks, MD      . traZODone (DESYREL) tablet 200 mg  200 mg Oral QHS Charlesetta Shanks, MD   200 mg at 01/31/17 2227  . ziprasidone (GEODON) capsule 120 mg  120 mg Oral QHS Corena Pilgrim, MD   120 mg at 01/31/17 2227   Current Outpatient Prescriptions  Medication Sig Dispense Refill  . acetaminophen (TYLENOL) 500 MG tablet Take 500 mg by mouth every 6 (six) hours as  needed for headache.    Marland Kitchen atomoxetine (STRATTERA) 60 MG capsule Take 60 mg by mouth daily.    . cetirizine (ZYRTEC) 10 MG tablet Take 1 tablet (10 mg total) by mouth daily. 30 tablet 0  . fluticasone (FLONASE) 50 MCG/ACT nasal spray Place 2 sprays into both nostrils daily.    Marland Kitchen guanFACINE (TENEX) 2 MG tablet Take 1 tablet (2 mg total) by mouth at bedtime. (Patient taking differently: Take 4 mg by mouth at bedtime. ) 30 tablet 0  . Insulin Glargine (LANTUS SOLOSTAR) 100 UNIT/ML Solostar Pen Inject 40 Units into the skin daily. (Patient taking differently: Inject 20 Units into the skin daily. ) 15 mL 1  . insulin lispro (HUMALOG KWIKPEN) 100 UNIT/ML KiwkPen 15-25 Units Subcutaneous 3 times daily before meals as directed (Patient taking differently: Inject 8-12 Units into the skin 3 (three) times daily. Sliding scale) 15 mL 1  . lisdexamfetamine (VYVANSE) 50 MG capsule Take 50 mg by mouth daily.    . ondansetron (ZOFRAN) 4 MG tablet Take 1 tablet (4 mg total) by mouth every 8 (eight) hours as needed for nausea or vomiting. 4 tablet 0  . traZODone (DESYREL) 150 MG tablet Take 200 mg by mouth at bedtime.     . ziprasidone (GEODON) 80 MG capsule Take 160 mg by mouth at bedtime.     . blood glucose meter kit and supplies KIT Dispense based on patient and insurance preference. Use up to four times daily as directed. (FOR ICD-9 250.00, 250.01). 1 each 0  . glucose blood (GLUCOSE METER TEST) test strip Use as  instructed for 4 times daily testing of blood glucose. 100 each 12  . Insulin Pen Needle 31G X 6 MM MISC 1 Device by Does not apply route daily at 8 pm. 31 each 1    Musculoskeletal: Strength & Muscle Tone: within normal limits Gait & Station: normal Patient leans: N/A  Psychiatric Specialty Exam: Physical Exam  Constitutional: He is oriented to person, place, and time. He appears well-developed and well-nourished.  HENT:  Head: Normocephalic.  Neck: Normal range of motion.  Respiratory: Effort normal.  Musculoskeletal: Normal range of motion.  Neurological: He is alert and oriented to person, place, and time.  Psychiatric: He has a normal mood and affect. His speech is normal and behavior is normal. Judgment and thought content normal. Cognition and memory are normal.    Review of Systems  All other systems reviewed and are negative.   Blood pressure (!) 88/43, pulse (!) 55, temperature 97.7 F (36.5 C), temperature source Oral, resp. rate 14, height 5' 2" (1.575 m), weight 65.8 kg (145 lb), SpO2 97 %.Body mass index is 26.52 kg/m.  General Appearance: Casual  Eye Contact:  Good  Speech:  Normal Rate  Volume:  Normal  Mood:  Euthymic  Affect:  Congruent  Thought Process:  Coherent and Descriptions of Associations: Intact  Orientation:  Full (Time, Place, and Person)  Thought Content:  WDL and Logical  Suicidal Thoughts:  No  Homicidal Thoughts:  No  Memory:  Immediate;   Fair Recent;   Fair Remote;   Fair  Judgement:  Fair  Insight:  Fair  Psychomotor Activity:  Normal  Concentration:  Concentration: Good and Attention Span: Good  Recall:  [employer] of Knowledge:  Fair  Language:  Good  Akathisia:  No  Handed:  Right  AIMS (if indicated):     Assets:  Housing Leisure Time Physical Health Resilience Social Support  ADL's:  Intact  Cognition:  WNL  Sleep:        Treatment Plan Summary: Daily contact with patient to assess and evaluate symptoms and  progress in treatment, Medication management and Plan major depressive disorder, recurrent, mild:  -Crisis stabilization -Medication management:  Medical medications along with Geodon 120 mg at bedtime for psychosis, Trazodone 200 mg at bedtime for sleep, Guafacine 4 mg at bedtime for ADD, and amantadine -Individual counseling  Disposition: No evidence of imminent risk to self or others at present.    Waylan Boga, NP 02/01/2017 10:10 AM  Patient seen face-to-face for psychiatric evaluation, chart reviewed and case discussed with the physician extender and developed treatment plan. Reviewed the information documented and agree with the treatment plan. Corena Pilgrim, MD

## 2017-02-01 NOTE — ED Notes (Signed)
Patient confirmed that he had "thoughts of suicide" which he attributed to death of girlfriend 4 years ago. He adamantly denies SI at this time as well as depression.  He denies ever having being hospitalized.  He is alert and oriented.  Diabetic diet reenforced. Continue 1:1 sitter for safety.

## 2017-02-14 ENCOUNTER — Encounter: Payer: Medicaid Other | Attending: Internal Medicine | Admitting: *Deleted

## 2017-02-14 ENCOUNTER — Ambulatory Visit: Payer: Medicaid Other | Admitting: *Deleted

## 2017-02-14 DIAGNOSIS — E1065 Type 1 diabetes mellitus with hyperglycemia: Secondary | ICD-10-CM | POA: Diagnosis not present

## 2017-02-14 DIAGNOSIS — Z794 Long term (current) use of insulin: Secondary | ICD-10-CM | POA: Diagnosis not present

## 2017-02-14 NOTE — Progress Notes (Signed)
Insulin Pump and / or CGM Evaluation Visit:  Date: 02/14/2017   Appt start time: 1000 end time:  1130.  Assessment:  This patient has DM 1 and their primary concerns today: to learn more about how insulin pumps work and see if he wants to proceed.  Usual physical activity: daily waking and playing with dog and family members MOther states knowledge of Carb Counting is 9 on a scale of 1-10 Patient states they are currently testing BG 5 times per day Patient does  currently have Ketone Strips  Last A1c was 9.3%   Current barriers to managing their diabetes: Patient states:  Leaving insulin at home during the day  MEDICATIONS: Basal Insulin: 30 units of Lantus at AM  Bolus Insulin: 8-12 units of Humalog at each meal   Total Daily Dose of insulin per day 60 Other diabetes medications: none  Progress Towards Obtaining an Insulin Pump Goal(s):   Mother states their expectations of pump therapy include: fewer injections and improved BG control Patient expresses understanding that for improved outcomes for their diabetes on an insulin pump they will:  During the first 1-2 weeks (or more) of new pump use, the patient will test BG pre and 2 hour post each meal, bedtime and 3 AM to assist the provider in evaluating accuracy of pump settings and to prevent DKA   Will test BG at least 4 times per day once evaluation time is completed  Change out pump infusion set at least every 3 days  Upload pump information to their pump's Web Based Program on a regular basis so provider can assess patterns and make setting adjustments.     Intervention:    Taught difference between delivery of insulin via syringe/pen compared to insulin pump.  Demonstrated improved insulin delivery via pump due to improved accuracy of dose and flexibility of adjusting bolus insulin based on carb intake and BG correction.  Showed patient the following pumps: Medtronic, OmniPod,   Discussed with patient the following  CGM: Medtronic [place] as he does not appear to need alarms and Josephine Igo is covered by [facility]  Demonstrated pump, insulin reservoir and infusion set options, and button pushing for bolus delivery of insulin through the pump  Explained importance of testing BG at least 4 times per day for appropriate correction of high BG and prevention of DKA as applicable.  Emphasized importance of follow up after Pump Start for appropriate pump setting adjustments and on-going training on more advanced features.  Discussed current Continuous Glucose Monitoring options [employer]   Handouts given during visit include:  Introduction to Pump Therapy Handout  Insulin Pump and /or CGM Packet from Medtronic per Mother's request. She is concerned he would leave PDA behind  Monitoring/Evaluation:    Patient does want to continue with pursuit of Medtronic 630G insulin pump.  Patient does want to continue with pursuit of Josephine Igo   I emailed both companies per mother's request so they can start the process of obtaining the pump. Contact information provided to the patient.  Once pump / CGM is shipped, patient to call this office to set up training.

## 2017-04-30 ENCOUNTER — Encounter: Payer: Medicaid Other | Attending: Internal Medicine | Admitting: *Deleted

## 2017-04-30 DIAGNOSIS — Z794 Long term (current) use of insulin: Secondary | ICD-10-CM | POA: Insufficient documentation

## 2017-04-30 DIAGNOSIS — E1065 Type 1 diabetes mellitus with hyperglycemia: Secondary | ICD-10-CM | POA: Insufficient documentation

## 2017-05-02 NOTE — Progress Notes (Signed)
Insulin Pump/CGM Start Progress Note on 04/30/2017  Patient appointment start time: 1460  End time 1630  Patient is not capable mentally to manage his pump so his mother, April, is being trained. She is also working with other family members as back up. Her oldest son is here today as well.  Patient here for insulin pump start on Medtronic 670G pump and [place] infusion set Patient here for CGM instruction No, patient started on CGM last week by [employer] Clinical Manager Orders with pump settings received from MD Patient's mother completed Pre- training by reading the training books and return demonstration  Reviewed Pump Set Up including  Menu Settings  Bolus Settings:    Insulin Carb Ratio of 1 unit / 10 grams     Insulin Sensitivity Factor of 1 unit / 35 mg/dl              Target: 132100 mg/dl  Suspend  Basal with initial Basal Rate of 1.00 units/hour  Reservoir Set Up  Utilities  Pump Training Checklist completed  Used Temp Basal of 17 hour duration @ 5 % basal due to patient taking their long acting insulin this AM at 9:30  Patient successfully completed pump start and instructed to call me if BG drops below 70 mg/dl or goes above 440300 mg/dl or as directed by MD  Follow up plan: patient and family to return in 3 days to upload pump to [employer] so reports can be reviewed and to assist with their 1st change out of infusion set and reservoir.

## 2017-05-03 ENCOUNTER — Encounter: Payer: Medicaid Other | Admitting: *Deleted

## 2017-05-03 DIAGNOSIS — E1065 Type 1 diabetes mellitus with hyperglycemia: Secondary | ICD-10-CM

## 2017-05-10 NOTE — Progress Notes (Signed)
  Pump / CGM Follow Up Progress Note Date of visit: 05/03/2017 Start time: 1130   End time: 1230    Orders received from MD giving me permission to make insulin pump/CGM adjustments for this patient.  Patient is currently on Medtronic 670G pump and CGM Today we concentrated on Mom changing out the reservoir and infusion set as well as changing out the sensor. I observed her and she was successful in completing the steps with minimal assistance from me. Father was also here to learn and stated he was comfortable with information covered today. Unable to upload pump on my computer on in Dr. Daune PerchKerr's office today. Family does not have a computer at home.   Plan to meet with patient and family on 05/14/2017 with Medtronic DCM to upload.

## 2017-05-27 ENCOUNTER — Inpatient Hospital Stay (HOSPITAL_COMMUNITY)
Admission: EM | Admit: 2017-05-27 | Discharge: 2017-05-30 | DRG: 637 | Disposition: A | Discharge status: Home / Self Care | Payer: Medicaid Other | Attending: Family Medicine | Admitting: Family Medicine

## 2017-05-27 ENCOUNTER — Emergency Department (HOSPITAL_COMMUNITY): Payer: Medicaid Other

## 2017-05-27 ENCOUNTER — Encounter (HOSPITAL_COMMUNITY): Payer: Self-pay | Admitting: Emergency Medicine

## 2017-05-27 DIAGNOSIS — Z794 Long term (current) use of insulin: Secondary | ICD-10-CM

## 2017-05-27 DIAGNOSIS — E8729 Other acidosis: Secondary | ICD-10-CM

## 2017-05-27 DIAGNOSIS — R6250 Unspecified lack of expected normal physiological development in childhood: Secondary | ICD-10-CM | POA: Diagnosis present

## 2017-05-27 DIAGNOSIS — F333 Major depressive disorder, recurrent, severe with psychotic symptoms: Secondary | ICD-10-CM | POA: Diagnosis present

## 2017-05-27 DIAGNOSIS — F909 Attention-deficit hyperactivity disorder, unspecified type: Secondary | ICD-10-CM | POA: Diagnosis present

## 2017-05-27 DIAGNOSIS — K047 Periapical abscess without sinus: Secondary | ICD-10-CM | POA: Diagnosis present

## 2017-05-27 DIAGNOSIS — W19XXXA Unspecified fall, initial encounter: Secondary | ICD-10-CM

## 2017-05-27 DIAGNOSIS — E872 Acidosis: Secondary | ICD-10-CM

## 2017-05-27 DIAGNOSIS — E1010 Type 1 diabetes mellitus with ketoacidosis without coma: Secondary | ICD-10-CM | POA: Diagnosis present

## 2017-05-27 DIAGNOSIS — R000 Tachycardia, unspecified: Secondary | ICD-10-CM | POA: Diagnosis present

## 2017-05-27 DIAGNOSIS — E875 Hyperkalemia: Secondary | ICD-10-CM | POA: Diagnosis present

## 2017-05-27 DIAGNOSIS — W1830XA Fall on same level, unspecified, initial encounter: Secondary | ICD-10-CM | POA: Diagnosis present

## 2017-05-27 DIAGNOSIS — E860 Dehydration: Secondary | ICD-10-CM | POA: Diagnosis present

## 2017-05-27 DIAGNOSIS — Z79899 Other long term (current) drug therapy: Secondary | ICD-10-CM

## 2017-05-27 DIAGNOSIS — Z91018 Allergy to other foods: Secondary | ICD-10-CM

## 2017-05-27 DIAGNOSIS — E876 Hypokalemia: Secondary | ICD-10-CM | POA: Diagnosis not present

## 2017-05-27 DIAGNOSIS — R112 Nausea with vomiting, unspecified: Secondary | ICD-10-CM | POA: Diagnosis present

## 2017-05-27 DIAGNOSIS — Z9641 Presence of insulin pump (external) (internal): Secondary | ICD-10-CM | POA: Diagnosis present

## 2017-05-27 DIAGNOSIS — A419 Sepsis, unspecified organism: Secondary | ICD-10-CM | POA: Diagnosis present

## 2017-05-27 DIAGNOSIS — Y92008 Other place in unspecified non-institutional (private) residence as the place of occurrence of the external cause: Secondary | ICD-10-CM

## 2017-05-27 DIAGNOSIS — K0889 Other specified disorders of teeth and supporting structures: Secondary | ICD-10-CM

## 2017-05-27 DIAGNOSIS — F419 Anxiety disorder, unspecified: Secondary | ICD-10-CM | POA: Diagnosis present

## 2017-05-27 DIAGNOSIS — Z833 Family history of diabetes mellitus: Secondary | ICD-10-CM

## 2017-05-27 DIAGNOSIS — E873 Alkalosis: Secondary | ICD-10-CM | POA: Diagnosis present

## 2017-05-27 DIAGNOSIS — N179 Acute kidney failure, unspecified: Secondary | ICD-10-CM | POA: Diagnosis present

## 2017-05-27 DIAGNOSIS — E1110 Type 2 diabetes mellitus with ketoacidosis without coma: Principal | ICD-10-CM | POA: Diagnosis present

## 2017-05-27 LAB — CBG MONITORING, ED: Glucose-Capillary: >600 mg/dL (ref 65–99)

## 2017-05-27 LAB — BLOOD GAS, ARTERIAL
Drawn by: 308601
FIO2: 21
O2 Saturation: 96.7 %
PH ART: 6.967 — AB (ref 7.350–7.450)
Patient temperature: 98.6
pO2, Arterial: 132 mmHg — ABNORMAL HIGH (ref 83.0–108.0)

## 2017-05-27 MED ORDER — SODIUM CHLORIDE 0.9 % IV BOLUS (SEPSIS)
1000.0000 mL | Freq: Once | INTRAVENOUS | Status: AC
  Administered 2017-05-28: 1000 mL via INTRAVENOUS

## 2017-05-27 MED ORDER — INSULIN REGULAR HUMAN 100 UNIT/ML IJ SOLN
INTRAMUSCULAR | Status: DC
  Administered 2017-05-28: 5.4 [IU]/h via INTRAVENOUS
  Filled 2017-05-27: qty 1

## 2017-05-27 MED ORDER — DEXTROSE-NACL 5-0.45 % IV SOLN: INTRAVENOUS | Status: DC

## 2017-05-27 MED ORDER — SODIUM CHLORIDE 0.9 % IV SOLN
INTRAVENOUS | Status: DC
  Administered 2017-05-28: 02:00:00 via INTRAVENOUS

## 2017-05-27 NOTE — ED Triage Notes (Signed)
Pt comes to ed, via ems , c/o hyperglycemia with fall unwitnessed.  Pt c/o of back pain all over verbalized,  Pt has a MR dx. Pt utilizes a newer insulin pump. Family mother checks his cbg regularly. Last read was high. V/s on arrival 126/50. Hr144, rr24, sp02 99 room air. Reports N with V.

## 2017-05-27 NOTE — ED Provider Notes (Signed)
[facility] DEPT Provider Note   CSN: [id number] Arrival date & time: 05/27/17  2316  By signing my name below, I, [name], attest that this documentation has been prepared under the direction and in the presence of [name], MD  Electronically Signed: [name], Scribe. 05/27/17. 11:39 PM. History   Chief Complaint Chief Complaint  Patient presents with  . Hyperglycemia  . Fall   The history is provided by the patient. No language interpreter was used.  Hyperglycemia  Severity:  Severe Onset quality:  Gradual Timing:  Constant Progression:  Worsening Chronicity:  Recurrent Diabetes status:  Controlled with insulin Context: not recent illness   Relieved by:  Nothing Ineffective treatments:  None tried Associated symptoms: confusion, nausea and vomiting   Risk factors: hx of DKA   HPI Comments: Dylan Butler is a 21 y.o. male with PMHx of DM, bipolar disorder, ADHD, and schizophrenia who presents to the Emergency Department complaining of constant, rapid-onset hyperglycemia that began around 12 hours ago. He has associated nausea, vomiting, dry-heaving, decreased eating, and "talking out of his head" for 1-2 hours. Pt's mother states he fell (unwitnessed) prior to arrival as well. Pt is on an insulin pump for his DM. Pt also takes depakote for schizophrenia. His mother keeps his medications in a daily pill box, but he self-adminsters. Pt has a h/o overdosing on medications with one 4 months ago.  Past Medical History:  Diagnosis Date  . ADHD (attention deficit hyperactivity disorder)   . Anxiety   . Bipolar 1 disorder ([place])   . Diabetes mellitus without complication ([place])   . 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 ODD (oppositional defiant disorder)   . Schizophrenia [facility])     Patient Active Problem List   Diagnosis Date Noted  . Major depressive disorder, recurrent episode, severe, with psychosis ([place]) 01/30/2017  . DKA  (diabetic ketoacidoses) ([place]) 11/26/2016  . Tachycardia 07/31/2016  . Ketoacidosis 07/31/2016  . Controlled type 1 diabetes mellitus with hyperglycemia ([place]) 07/31/2016  . Nausea with vomiting 07/31/2016  . DKA, type 1 ([place]) 07/31/2016    Past Surgical History:  Procedure Laterality Date  . HERNIA REPAIR         Home Medications    Prior to Admission medications   Medication Sig Start Date End Date Taking? Authorizing Provider  acetaminophen (TYLENOL) 500 MG tablet Take 500 mg by mouth every 6 (six) hours as needed for headache.    [provider]  amantadine (SYMMETREL) 100 MG capsule Take 1 capsule (100 mg total) by mouth at bedtime. 02/01/17   Patrecia Pour, NP  atomoxetine (STRATTERA) 60 MG capsule Take 1 capsule (60 mg total) by mouth daily. 02/01/17   Patrecia Pour, NP  blood glucose meter kit and supplies KIT Dispense based on patient and insurance preference. Use up to four times daily as directed. (FOR ICD-9 250.00, 250.01). 08/03/16   Theodis Blaze, MD  cetirizine (ZYRTEC) 10 MG tablet Take 1 tablet (10 mg total) by mouth daily. 11/27/16   Johnson, Clanford L, MD  fluticasone (FLONASE) 50 MCG/ACT nasal spray Place 2 sprays into both nostrils daily.    [provider]  glucose blood (GLUCOSE METER TEST) test strip Use as instructed for 4 times daily testing of blood glucose. 12/14/16   Tresa Garter, MD  guanFACINE (TENEX) 2 MG tablet Take 2 tablets (4 mg total) by mouth at bedtime. 02/01/17   Patrecia Pour,  NP  Insulin Glargine (LANTUS SOLOSTAR) 100 UNIT/ML Solostar Pen Inject 40 Units into the skin daily. Patient taking differently: Inject 20 Units into the skin daily.  11/27/16   Johnson, Clanford L, MD  insulin lispro (HUMALOG KWIKPEN) 100 UNIT/ML KiwkPen 15-25 Units Subcutaneous 3 times daily before meals as directed Patient taking differently: Inject 8-12 Units into the skin 3 (three) times daily. Sliding scale 11/27/16   Irwin Brakeman L, MD    Insulin Pen Needle 31G X 6 MM MISC 1 Device by Does not apply route daily at 8 pm. 11/29/16   Emokpae, Ejiroghene E, MD  lisdexamfetamine (VYVANSE) 50 MG capsule Take 50 mg by mouth daily.    [provider]  ondansetron (ZOFRAN) 4 MG tablet Take 1 tablet (4 mg total) by mouth every 8 (eight) hours as needed for nausea or vomiting. 01/14/17   Rolland Porter, MD  traZODone (DESYREL) 150 MG tablet Take 1.5 tablets (225 mg total) by mouth at bedtime. 02/01/17   Patrecia Pour, NP  ziprasidone (GEODON) 60 MG capsule Take 2 capsules (120 mg total) by mouth at bedtime. 02/01/17   Patrecia Pour, NP  ziprasidone (GEODON) 80 MG capsule Take 160 mg by mouth at bedtime.     [provider]    Family History Family History  Problem Relation Age of Onset  . Diabetes Mellitus II Maternal Grandmother   . CAD Maternal Grandmother   . Seizures Mother     Social History Social History  Substance Use Topics  . Smoking status: Never Smoker  . Smokeless tobacco: Never Used  . Alcohol use No     Allergies   Other   Review of Systems Review of Systems  Constitutional: Positive for appetite change.  Gastrointestinal: Positive for nausea and vomiting.  Psychiatric/Behavioral: Positive for confusion.  All other systems reviewed and are negative.    Physical Exam Updated Vital Signs BP (!) 120/49 (BP Location: Left Arm)   Pulse (!) 145   Temp 97.7 F (36.5 C) (Oral)   Resp (!) 30   SpO2 95%   Physical Exam  Constitutional: He appears well-developed and well-nourished.  HENT:  Head: Normocephalic and atraumatic.  Mouth/Throat: No oropharyngeal exudate.  Eyes: Conjunctivae and EOM are normal.  Neck: Normal range of motion. Neck supple. No JVD present.  Cardiovascular: Regular rhythm, normal heart sounds and intact distal pulses.   tachycardic  Pulmonary/Chest: Effort normal and breath sounds normal. No stridor. No respiratory distress. He has no wheezes. He has no rales.   Abdominal: Soft. Bowel sounds are normal. He exhibits no distension and no mass. There is no tenderness. There is no guarding.  Musculoskeletal: Normal range of motion.  Neurological: He displays normal reflexes.  GCS 14  Skin: Skin is warm and dry. Capillary refill takes less than 2 seconds.  Psychiatric: He has a normal mood and affect.  Nursing note and vitals reviewed.    ED Treatments / Results   Vitals:   05/28/17 0130 05/28/17 0200  BP: 113/64 (!) 120/47  Pulse:  (!) 168  Resp: (!) 32 (!) 34  Temp:      DIAGNOSTIC STUDIES: Oxygen Saturation is 95% on RA, normal by my interpretation.    COORDINATION OF CARE: 11:35 PM Discussed treatment plan with pt at bedside and pt agreed to plan.  Labs (all labs ordered are listed, but only abnormal results are displayed)  Results for orders placed or performed during the hospital encounter of 05/27/17  CBC  Result Value Ref Range   WBC 41.0 (H) 4.0 - 10.5 K/uL   RBC 5.36 4.22 - 5.81 MIL/uL   Hemoglobin 15.2 13.0 - 17.0 g/dL   HCT 45.7 39.0 - 52.0 %   MCV 85.3 78.0 - 100.0 fL   MCH 28.4 26.0 - 34.0 pg   MCHC 33.3 30.0 - 36.0 g/dL   RDW 13.0 11.5 - 15.5 %   Platelets 411 (H) 150 - 400 K/uL  Urinalysis, Routine w reflex microscopic  Result Value Ref Range   Color, Urine YELLOW YELLOW   APPearance CLEAR CLEAR   Specific Gravity, Urine 1.018 1.005 - 1.030   pH 5.0 5.0 - 8.0   Glucose, UA >=500 (A) NEGATIVE mg/dL   Hgb urine dipstick MODERATE (A) NEGATIVE   Bilirubin Urine NEGATIVE NEGATIVE   Ketones, ur 80 (A) NEGATIVE mg/dL   Protein, ur 30 (A) NEGATIVE mg/dL   Nitrite NEGATIVE NEGATIVE   Leukocytes, UA NEGATIVE NEGATIVE   RBC / HPF NONE SEEN 0 - 5 RBC/hpf   WBC, UA 0-5 0 - 5 WBC/hpf   Bacteria, UA NONE SEEN NONE SEEN   Squamous Epithelial / LPF 0-5 (A) NONE SEEN   Mucous PRESENT   Acetaminophen level  Result Value Ref Range   Acetaminophen (Tylenol), Serum <10 (L) 10 - 30 ug/mL  Ethanol  Result Value Ref Range    Alcohol, Ethyl (B) <5 <5 mg/dL  Salicylate level  Result Value Ref Range   Salicylate Lvl <8.5 2.8 - 30.0 mg/dL  Rapid urine drug screen (hospital performed)  Result Value Ref Range   Opiates NONE DETECTED NONE DETECTED   Cocaine NONE DETECTED NONE DETECTED   Benzodiazepines NONE DETECTED NONE DETECTED   Amphetamines NONE DETECTED NONE DETECTED   Tetrahydrocannabinol NONE DETECTED NONE DETECTED   Barbiturates NONE DETECTED NONE DETECTED  Blood gas, arterial ([facility] & [facility] ONLY)  Result Value Ref Range   FIO2 21.00    Delivery systems ROOM AIR    pH, Arterial 6.967 (LL) 7.350 - 7.450   pCO2 arterial BELOW REPORTABLE RANGE. 32.0 - 48.0 mmHg   pO2, Arterial 132 (H) 83.0 - 108.0 mmHg   O2 Saturation 96.7 %   Patient temperature 98.6    Collection site RIGHT RADIAL    Drawn by [id number]    Sample type ARTERIAL DRAW    Allens test (pass/fail) PASS PASS  CBG monitoring, ED  Result Value Ref Range   Glucose-Capillary >600 (HH) 65 - 99 mg/dL  I-stat chem 8, ed  Result Value Ref Range   Sodium 129 (L) 135 - 145 mmol/L   Potassium 7.0 (HH) 3.5 - 5.1 mmol/L   Chloride 100 (L) 101 - 111 mmol/L   BUN 51 (H) 6 - 20 mg/dL   Creatinine, Ser 1.20 0.61 - 1.24 mg/dL   Glucose, Bld >700 (HH) 65 - 99 mg/dL   Calcium, Ion 1.09 (L) 1.15 - 1.40 mmol/L   TCO2 9 0 - 100 mmol/L   Hemoglobin 17.7 (H) 13.0 - 17.0 g/dL   HCT 52.0 39.0 - 52.0 %   Comment NOTIFIED PHYSICIAN   CBG monitoring, ED  Result Value Ref Range   Glucose-Capillary >600 (HH) 65 - 99 mg/dL   Ct Head Wo Contrast  Result Date: 05/28/2017 CLINICAL DATA:  Unwitnessed fall.  Altered mental status. EXAM: CT HEAD WITHOUT CONTRAST CT CERVICAL SPINE WITHOUT CONTRAST TECHNIQUE: Multidetector CT imaging of the head and cervical spine was performed following the standard protocol without intravenous contrast. Multiplanar CT  image reconstructions of the cervical spine were also generated. COMPARISON:  None. FINDINGS: CT HEAD FINDINGS Brain: No  evidence of acute infarction, hemorrhage, hydrocephalus, extra-axial collection or mass lesion/mass effect. Vascular: No hyperdense vessel or unexpected calcification. Skull: No skull fracture. Sinuses/Orbits: Paranasal sinuses are well-aerated. Visualized orbits are normal. Hypoplastic cleft mastoid air cells. Other: None. CT CERVICAL SPINE FINDINGS Mild motion artifact. Alignment: Patient's head is tilted. No traumatic subluxation. Facets are normally aligned. Skull base and vertebrae: No evidence of acute fracture allowing for patient motion. Vertebral body heights are normal. Soft tissues and spinal canal: No prevertebral fluid or swelling. No visible canal hematoma. Disc levels:  Normal. Upper chest: Negative. Other: None. IMPRESSION: 1.  No acute intracranial abnormality.  No skull fracture. 2. No fracture or subluxation of the cervical spine allowing for mild motion artifact. Electronically Signed   By: Jeb Levering M.D.   On: 05/28/2017 00:49   Ct Cervical Spine Wo Contrast  Result Date: 05/28/2017 CLINICAL DATA:  Unwitnessed fall.  Altered mental status. EXAM: CT HEAD WITHOUT CONTRAST CT CERVICAL SPINE WITHOUT CONTRAST TECHNIQUE: Multidetector CT imaging of the head and cervical spine was performed following the standard protocol without intravenous contrast. Multiplanar CT image reconstructions of the cervical spine were also generated. COMPARISON:  None. FINDINGS: CT HEAD FINDINGS Brain: No evidence of acute infarction, hemorrhage, hydrocephalus, extra-axial collection or mass lesion/mass effect. Vascular: No hyperdense vessel or unexpected calcification. Skull: No skull fracture. Sinuses/Orbits: Paranasal sinuses are well-aerated. Visualized orbits are normal. Hypoplastic cleft mastoid air cells. Other: None. CT CERVICAL SPINE FINDINGS Mild motion artifact. Alignment: Patient's head is tilted. No traumatic subluxation. Facets are normally aligned. Skull base and vertebrae: No evidence of acute  fracture allowing for patient motion. Vertebral body heights are normal. Soft tissues and spinal canal: No prevertebral fluid or swelling. No visible canal hematoma. Disc levels:  Normal. Upper chest: Negative. Other: None. IMPRESSION: 1.  No acute intracranial abnormality.  No skull fracture. 2. No fracture or subluxation of the cervical spine allowing for mild motion artifact. Electronically Signed   By: Jeb Levering M.D.   On: 05/28/2017 00:49    EKG  EKG Interpretation  Date/Time:  Monday May 27 2017 23:17:31 EDT Ventricular Rate:  145 PR Interval:    QRS Duration: 85 QT Interval:  284 QTC Calculation: 441 R Axis:   100 Text Interpretation:  Sinus tachycardia peaked t waves  Confirmed by Randal Buba, Leisl Spurrier ([id number]) on 05/27/2017 11:24:26 PM       Radiology No results found.  Procedures Procedures (including critical care time)  Medications Ordered in ED  Medications  insulin regular (NOVOLIN R,HUMULIN R) 100 Units in sodium chloride 0.9 % 100 mL (1 Units/mL) infusion (5.4 Units/hr Intravenous New Bag/Given 05/28/17 0026)  sodium chloride 0.9 % bolus 1,000 mL (0 mLs Intravenous Stopped 05/28/17 0138)    And  sodium chloride 0.9 % bolus 1,000 mL (0 mLs Intravenous Stopped 05/28/17 0201)    And  0.9 %  sodium chloride infusion ( Intravenous New Bag/Given 05/28/17 0146)  dextrose 5 %-0.45 % sodium chloride infusion (not administered)  sodium bicarbonate 150 mEq in sterile water 1,000 mL infusion ( Intravenous New Bag/Given 05/28/17 0236)  albuterol (PROVENTIL) (2.5 MG/3ML) 0.083% nebulizer solution 2.5 mg (not administered)  albuterol (PROVENTIL) (2.5 MG/3ML) 0.083% nebulizer solution 10 mg (10 mg Nebulization Given 05/28/17 0051)  calcium gluconate 1 g in sodium chloride 0.9 % 100 mL IVPB (0 g Intravenous Stopped 05/28/17 0144)  sodium bicarbonate injection 50  mEq (50 mEq Intravenous Given 05/28/17 0221)   MDM Reviewed: previous chart, nursing note and vitals Reviewed previous:  labs Interpretation: labs, ECG and x-ray (hyperkalemia 7 acidosis 6.97 ph is low, nacpd on cxr ) Total time providing critical care: > 105 minutes. This excludes time spent performing separately reportable procedures and services. Consults: critical care  CRITICAL CARE Performed by: Carlisle Beers Total critical care time: 120 minutes Critical care time was exclusive of separately billable procedures and treating other patients. Critical care was necessary to treat or prevent imminent or life-threatening deterioration. Critical care was time spent personally by me on the following activities: development of treatment plan with patient and/or surrogate as well as nursing, discussions with consultants, evaluation of patient's response to treatment, examination of patient, obtaining history from patient or surrogate, ordering and performing treatments and interventions, ordering and review of laboratory studies, ordering and review of radiographic studies, pulse oximetry and re-evaluation of patient's condition.  @0203  Dr. Madalyn Rob from [facility] wants bicarb drip for the patient and critical care to admit given acidosis and hyperkalemia  Final Clinical Impressions(s) / ED Diagnoses  DKA and hyperkalemia:  Will admit to critical.  Mental status is unchanged and patient is ventilating well at this time.  Given anion gap and potassium I believe the ICU is the correct level of care.  PCCM to see.     I personally performed the services described in this documentation, which was scribed in my presence. The recorded information has been reviewed and is accurate.      Viktor Philipp, MD 05/28/17 [phone]

## 2017-05-27 NOTE — ED Notes (Signed)
Bed: [id number] Expected date:  Expected time:  Means of arrival:  Comments: 23 yr hyperglycemia. Patient is on an insulin pump. Tachy - 150

## 2017-05-28 ENCOUNTER — Encounter (HOSPITAL_COMMUNITY): Payer: Self-pay | Admitting: Emergency Medicine

## 2017-05-28 ENCOUNTER — Inpatient Hospital Stay (HOSPITAL_COMMUNITY): Payer: Medicaid Other

## 2017-05-28 ENCOUNTER — Emergency Department (HOSPITAL_COMMUNITY): Payer: Medicaid Other

## 2017-05-28 DIAGNOSIS — E875 Hyperkalemia: Secondary | ICD-10-CM | POA: Diagnosis present

## 2017-05-28 DIAGNOSIS — Z9641 Presence of insulin pump (external) (internal): Secondary | ICD-10-CM | POA: Diagnosis present

## 2017-05-28 DIAGNOSIS — E872 Acidosis: Secondary | ICD-10-CM | POA: Diagnosis not present

## 2017-05-28 DIAGNOSIS — E8729 Other acidosis: Secondary | ICD-10-CM

## 2017-05-28 DIAGNOSIS — Z833 Family history of diabetes mellitus: Secondary | ICD-10-CM | POA: Diagnosis not present

## 2017-05-28 DIAGNOSIS — Z91018 Allergy to other foods: Secondary | ICD-10-CM | POA: Diagnosis not present

## 2017-05-28 DIAGNOSIS — E1010 Type 1 diabetes mellitus with ketoacidosis without coma: Secondary | ICD-10-CM | POA: Diagnosis not present

## 2017-05-28 DIAGNOSIS — E876 Hypokalemia: Secondary | ICD-10-CM | POA: Diagnosis not present

## 2017-05-28 DIAGNOSIS — Y92008 Other place in unspecified non-institutional (private) residence as the place of occurrence of the external cause: Secondary | ICD-10-CM | POA: Diagnosis not present

## 2017-05-28 DIAGNOSIS — W1830XA Fall on same level, unspecified, initial encounter: Secondary | ICD-10-CM | POA: Diagnosis present

## 2017-05-28 DIAGNOSIS — N179 Acute kidney failure, unspecified: Secondary | ICD-10-CM

## 2017-05-28 DIAGNOSIS — F909 Attention-deficit hyperactivity disorder, unspecified type: Secondary | ICD-10-CM | POA: Diagnosis present

## 2017-05-28 DIAGNOSIS — F419 Anxiety disorder, unspecified: Secondary | ICD-10-CM | POA: Diagnosis present

## 2017-05-28 DIAGNOSIS — K047 Periapical abscess without sinus: Secondary | ICD-10-CM | POA: Diagnosis present

## 2017-05-28 DIAGNOSIS — F333 Major depressive disorder, recurrent, severe with psychotic symptoms: Secondary | ICD-10-CM | POA: Diagnosis present

## 2017-05-28 DIAGNOSIS — A419 Sepsis, unspecified organism: Secondary | ICD-10-CM | POA: Diagnosis present

## 2017-05-28 DIAGNOSIS — W19XXXA Unspecified fall, initial encounter: Secondary | ICD-10-CM | POA: Diagnosis not present

## 2017-05-28 DIAGNOSIS — E860 Dehydration: Secondary | ICD-10-CM | POA: Diagnosis present

## 2017-05-28 DIAGNOSIS — E873 Alkalosis: Secondary | ICD-10-CM | POA: Diagnosis present

## 2017-05-28 DIAGNOSIS — R000 Tachycardia, unspecified: Secondary | ICD-10-CM | POA: Diagnosis present

## 2017-05-28 DIAGNOSIS — R6250 Unspecified lack of expected normal physiological development in childhood: Secondary | ICD-10-CM | POA: Diagnosis present

## 2017-05-28 DIAGNOSIS — Z79899 Other long term (current) drug therapy: Secondary | ICD-10-CM | POA: Diagnosis not present

## 2017-05-28 DIAGNOSIS — E1110 Type 2 diabetes mellitus with ketoacidosis without coma: Secondary | ICD-10-CM | POA: Diagnosis not present

## 2017-05-28 DIAGNOSIS — Z794 Long term (current) use of insulin: Secondary | ICD-10-CM | POA: Diagnosis not present

## 2017-05-28 LAB — BASIC METABOLIC PANEL
ANION GAP: 14 (ref 5–15)
Anion gap: 10 (ref 5–15)
Anion gap: 10 (ref 5–15)
Anion gap: 9 (ref 5–15)
BUN: 36 mg/dL — AB (ref 6–20)
BUN: 25 mg/dL — AB (ref 6–20)
BUN: 21 mg/dL — AB (ref 6–20)
BUN: 15 mg/dL (ref 6–20)
BUN: 12 mg/dL (ref 6–20)
CALCIUM: 8.4 mg/dL — AB (ref 8.9–10.3)
CALCIUM: 8.2 mg/dL — AB (ref 8.9–10.3)
CALCIUM: 8.1 mg/dL — AB (ref 8.9–10.3)
CHLORIDE: 107 mmol/L (ref 101–111)
CO2: <7 mmol/L — ABNORMAL LOW (ref 22–32)
CO2: 15 mmol/L — ABNORMAL LOW (ref 22–32)
CO2: 19 mmol/L — ABNORMAL LOW (ref 22–32)
CO2: 22 mmol/L (ref 22–32)
CO2: 21 mmol/L — ABNORMAL LOW (ref 22–32)
CREATININE: 1.76 mg/dL — AB (ref 0.61–1.24)
CREATININE: 0.99 mg/dL (ref 0.61–1.24)
CREATININE: 0.85 mg/dL (ref 0.61–1.24)
CREATININE: 0.86 mg/dL (ref 0.61–1.24)
Calcium: 8 mg/dL — ABNORMAL LOW (ref 8.9–10.3)
Calcium: 8.2 mg/dL — ABNORMAL LOW (ref 8.9–10.3)
Chloride: 107 mmol/L (ref 101–111)
Chloride: 113 mmol/L — ABNORMAL HIGH (ref 101–111)
Chloride: 112 mmol/L — ABNORMAL HIGH (ref 101–111)
Chloride: 107 mmol/L (ref 101–111)
Creatinine, Ser: 1.24 mg/dL (ref 0.61–1.24)
GFR calc Af Amer: >60 mL/min (ref >60)
GFR calc Af Amer: >60 mL/min (ref >60)
GFR calc Af Amer: >60 mL/min (ref >60)
GFR calc Af Amer: >60 mL/min (ref >60)
GFR calc non Af Amer: >60 mL/min (ref >60)
GFR, EST NON AFRICAN AMERICAN: 54 mL/min — AB (ref >60)
GLUCOSE: 239 mg/dL — AB (ref 65–99)
GLUCOSE: 182 mg/dL — AB (ref 65–99)
GLUCOSE: 220 mg/dL — AB (ref 65–99)
Glucose, Bld: 512 mg/dL (ref 65–99)
Glucose, Bld: 145 mg/dL — ABNORMAL HIGH (ref 65–99)
Potassium: 5.1 mmol/L (ref 3.5–5.1)
Potassium: 3.7 mmol/L (ref 3.5–5.1)
Potassium: 3.5 mmol/L (ref 3.5–5.1)
Potassium: 4 mmol/L (ref 3.5–5.1)
Potassium: 3.2 mmol/L — ABNORMAL LOW (ref 3.5–5.1)
SODIUM: 137 mmol/L (ref 135–145)
SODIUM: 142 mmol/L (ref 135–145)
SODIUM: 139 mmol/L (ref 135–145)
Sodium: 141 mmol/L (ref 135–145)
Sodium: 137 mmol/L (ref 135–145)

## 2017-05-28 LAB — CBG MONITORING, ED: Glucose-Capillary: 590 mg/dL (ref 65–99)

## 2017-05-28 LAB — GLUCOSE, CAPILLARY
GLUCOSE-CAPILLARY: 396 mg/dL — AB (ref 65–99)
GLUCOSE-CAPILLARY: 186 mg/dL — AB (ref 65–99)
GLUCOSE-CAPILLARY: 185 mg/dL — AB (ref 65–99)
GLUCOSE-CAPILLARY: 139 mg/dL — AB (ref 65–99)
GLUCOSE-CAPILLARY: 121 mg/dL — AB (ref 65–99)
GLUCOSE-CAPILLARY: 191 mg/dL — AB (ref 65–99)
Glucose-Capillary: 342 mg/dL — ABNORMAL HIGH (ref 65–99)
Glucose-Capillary: 252 mg/dL — ABNORMAL HIGH (ref 65–99)
Glucose-Capillary: 365 mg/dL — ABNORMAL HIGH (ref 65–99)
Glucose-Capillary: 233 mg/dL — ABNORMAL HIGH (ref 65–99)
Glucose-Capillary: 189 mg/dL — ABNORMAL HIGH (ref 65–99)
Glucose-Capillary: 136 mg/dL — ABNORMAL HIGH (ref 65–99)
Glucose-Capillary: 125 mg/dL — ABNORMAL HIGH (ref 65–99)
Glucose-Capillary: 136 mg/dL — ABNORMAL HIGH (ref 65–99)
Glucose-Capillary: 145 mg/dL — ABNORMAL HIGH (ref 65–99)
Glucose-Capillary: 208 mg/dL — ABNORMAL HIGH (ref 65–99)
Glucose-Capillary: 188 mg/dL — ABNORMAL HIGH (ref 65–99)

## 2017-05-28 LAB — ACETAMINOPHEN LEVEL

## 2017-05-28 LAB — I-STAT CHEM 8, ED
BUN: 51 mg/dL — ABNORMAL HIGH (ref 6–20)
CREATININE: 1.2 mg/dL (ref 0.61–1.24)
Calcium, Ion: 1.09 mmol/L — ABNORMAL LOW (ref 1.15–1.40)
Chloride: 100 mmol/L — ABNORMAL LOW (ref 101–111)
HCT: 52 % (ref 39.0–52.0)
HEMOGLOBIN: 17.7 g/dL — AB (ref 13.0–17.0)
Potassium: 7 mmol/L (ref 3.5–5.1)
SODIUM: 129 mmol/L — AB (ref 135–145)
TCO2: 9 mmol/L (ref 0–100)

## 2017-05-28 LAB — RAPID URINE DRUG SCREEN, HOSP PERFORMED
AMPHETAMINES: NOT DETECTED
Barbiturates: NOT DETECTED
Benzodiazepines: NOT DETECTED
Cocaine: NOT DETECTED
Opiates: NOT DETECTED
TETRAHYDROCANNABINOL: NOT DETECTED

## 2017-05-28 LAB — BLOOD GAS, ARTERIAL
DRAWN BY: 308601
FIO2: 21
O2 SAT: 98 %
PATIENT TEMPERATURE: 98.6
pH, Arterial: 7.218 — ABNORMAL LOW (ref 7.350–7.450)
pO2, Arterial: 117 mmHg — ABNORMAL HIGH (ref 83.0–108.0)

## 2017-05-28 LAB — CBC
HEMATOCRIT: 45.7 % (ref 39.0–52.0)
HEMOGLOBIN: 15.2 g/dL (ref 13.0–17.0)
MCH: 28.4 pg (ref 26.0–34.0)
MCHC: 33.3 g/dL (ref 30.0–36.0)
MCV: 85.3 fL (ref 78.0–100.0)
Platelets: 411 10*3/uL — ABNORMAL HIGH (ref 150–400)
RBC: 5.36 MIL/uL (ref 4.22–5.81)
RDW: 13 % (ref 11.5–15.5)
WBC: 41 10*3/uL — ABNORMAL HIGH (ref 4.0–10.5)

## 2017-05-28 LAB — URINALYSIS, ROUTINE W REFLEX MICROSCOPIC
Bacteria, UA: NONE SEEN
Bilirubin Urine: NEGATIVE
KETONES UR: 80 mg/dL — AB
Leukocytes, UA: NEGATIVE
Nitrite: NEGATIVE
PROTEIN: 30 mg/dL — AB
RBC / HPF: NONE SEEN RBC/hpf (ref 0–5)
Specific Gravity, Urine: 1.018 (ref 1.005–1.030)
pH: 5 (ref 5.0–8.0)

## 2017-05-28 LAB — MAGNESIUM: MAGNESIUM: 1.8 mg/dL (ref 1.7–2.4)

## 2017-05-28 LAB — MRSA PCR SCREENING: MRSA by PCR: NEGATIVE

## 2017-05-28 LAB — OSMOLALITY: OSMOLALITY: 340 mosm/kg — AB (ref 275–295)

## 2017-05-28 LAB — SALICYLATE LEVEL: Salicylate Lvl: <7 mg/dL (ref 2.8–30.0)

## 2017-05-28 LAB — PHOSPHORUS: Phosphorus: 2 mg/dL — ABNORMAL LOW (ref 2.5–4.6)

## 2017-05-28 LAB — ETHANOL: Alcohol, Ethyl (B): <5 mg/dL (ref <5)

## 2017-05-28 LAB — PROCALCITONIN: PROCALCITONIN: 15.13 ng/mL

## 2017-05-28 MED ORDER — ZIPRASIDONE HCL 80 MG PO CAPS
160.0000 mg | ORAL_CAPSULE | Freq: Every day | ORAL | Status: DC
  Administered 2017-05-28 – 2017-05-29 (×2): 160 mg via ORAL
  Filled 2017-05-28 (×2): qty 2

## 2017-05-28 MED ORDER — SODIUM CHLORIDE 0.9 % IV SOLN
INTRAVENOUS | Status: DC
  Administered 2017-05-28: 13:00:00 via INTRAVENOUS
  Filled 2017-05-28 (×2): qty 1

## 2017-05-28 MED ORDER — CALCIUM GLUCONATE 10 % IV SOLN
1.0000 g | Freq: Once | INTRAVENOUS | Status: DC
  Filled 2017-05-28: qty 10

## 2017-05-28 MED ORDER — CALCIUM GLUCONATE 10 % IV SOLN
1.0000 g | Freq: Once | INTRAVENOUS | Status: AC
  Administered 2017-05-28: 1 g via INTRAVENOUS
  Filled 2017-05-28: qty 10

## 2017-05-28 MED ORDER — DIVALPROEX SODIUM ER 500 MG PO TB24
500.0000 mg | ORAL_TABLET | Freq: Every day | ORAL | Status: DC
  Administered 2017-05-28 – 2017-05-29 (×2): 500 mg via ORAL
  Filled 2017-05-28: qty 1
  Filled 2017-05-28: qty 2

## 2017-05-28 MED ORDER — SODIUM BICARBONATE 8.4 % IV SOLN
50.0000 meq | Freq: Once | INTRAVENOUS | Status: AC
  Administered 2017-05-28: 50 meq via INTRAVENOUS
  Filled 2017-05-28: qty 50

## 2017-05-28 MED ORDER — LACTATED RINGERS IV SOLN: INTRAVENOUS | Status: DC

## 2017-05-28 MED ORDER — ATOMOXETINE HCL 40 MG PO CAPS
80.0000 mg | ORAL_CAPSULE | Freq: Every day | ORAL | Status: DC
  Administered 2017-05-28 – 2017-05-30 (×3): 80 mg via ORAL
  Filled 2017-05-28 (×3): qty 2

## 2017-05-28 MED ORDER — CALCIUM GLUCONATE 10 % IV SOLN: 1.0000 g | Freq: Once | INTRAVENOUS | Status: DC

## 2017-05-28 MED ORDER — SODIUM CHLORIDE 0.9 % IV SOLN
INTRAVENOUS | Status: DC
  Administered 2017-05-28: 06:00:00 via INTRAVENOUS

## 2017-05-28 MED ORDER — SODIUM BICARBONATE 8.4 % IV SOLN
INTRAVENOUS | Status: DC
  Administered 2017-05-28: 03:00:00 via INTRAVENOUS
  Filled 2017-05-28: qty 850

## 2017-05-28 MED ORDER — VANCOMYCIN HCL IN DEXTROSE 750-5 MG/150ML-% IV SOLN
750.0000 mg | Freq: Two times a day (BID) | INTRAVENOUS | Status: DC
  Administered 2017-05-28 – 2017-05-30 (×4): 750 mg via INTRAVENOUS
  Filled 2017-05-28 (×6): qty 150

## 2017-05-28 MED ORDER — ENOXAPARIN SODIUM 40 MG/0.4ML ~~LOC~~ SOLN
40.0000 mg | SUBCUTANEOUS | Status: DC
  Administered 2017-05-29 – 2017-05-30 (×2): 40 mg via SUBCUTANEOUS
  Filled 2017-05-28 (×2): qty 0.4

## 2017-05-28 MED ORDER — SODIUM CHLORIDE 0.9 % IV SOLN: INTRAVENOUS | Status: DC

## 2017-05-28 MED ORDER — ALBUTEROL SULFATE (2.5 MG/3ML) 0.083% IN NEBU
10.0000 mg | INHALATION_SOLUTION | Freq: Once | RESPIRATORY_TRACT | Status: AC
  Administered 2017-05-28: 10 mg via RESPIRATORY_TRACT
  Filled 2017-05-28: qty 12

## 2017-05-28 MED ORDER — ALBUTEROL SULFATE (2.5 MG/3ML) 0.083% IN NEBU: 2.5000 mg | INHALATION_SOLUTION | Freq: Once | RESPIRATORY_TRACT | Status: DC

## 2017-05-28 MED ORDER — LACTATED RINGERS IV BOLUS (SEPSIS)
1000.0000 mL | Freq: Once | INTRAVENOUS | Status: AC
  Administered 2017-05-28: 1000 mL via INTRAVENOUS

## 2017-05-28 MED ORDER — ENOXAPARIN SODIUM 30 MG/0.3ML ~~LOC~~ SOLN: 30.0000 mg | SUBCUTANEOUS | Status: DC

## 2017-05-28 MED ORDER — DEXTROSE-NACL 5-0.45 % IV SOLN
INTRAVENOUS | Status: DC
  Administered 2017-05-28 – 2017-05-29 (×3): via INTRAVENOUS

## 2017-05-28 MED ORDER — PIPERACILLIN-TAZOBACTAM 3.375 G IVPB
3.3750 g | Freq: Three times a day (TID) | INTRAVENOUS | Status: DC
  Administered 2017-05-28 – 2017-05-30 (×6): 3.375 g via INTRAVENOUS
  Filled 2017-05-28 (×7): qty 50

## 2017-05-28 NOTE — Progress Notes (Addendum)
PCCM Attending:  Procalcitonin elevated at 15.Still no clear source of infection. Urinalysis appeared bland. Checking stat urine culture and blood cultures. Checking maxillofacial CT given ongoing tooth pain. Starting empiric Vancomycin & Zosyn after blood cultures obtained.  Donna ChristenJennings E. Jamison NeighborNestor, M.D. [facility] Pager:  [phone] After 3pm or if no response, call [phone] 10:23 AM 05/28/17

## 2017-05-28 NOTE — Progress Notes (Signed)
Inpatient Diabetes Program Recommendations  AACE/ADA: New Consensus Statement on Inpatient Glycemic Control (2015)  Target Ranges:  Prepandial:   less than 140 mg/dL      Peak postprandial:   less than 180 mg/dL (1-2 hours)      Critically ill patients:  140 - 180 mg/dL   Lab Results  Component Value Date   GLUCAP 136 (H) 05/28/2017   HGBA1C 9.2 (H) 12/06/2016    Review of Glycemic Control  Diabetes history: DM1 Outpatient Diabetes medications:  Basal Insulin: 30 units of Lantus at AM  Bolus Insulin: 8-12 units of Humalog at each meal   Total Daily Dose of insulin per day 60 Other diabetes medications: none Current orders for Inpatient glycemic control: IV insulin per GlucoStabilizer Medtronic 630G insulin pump.Libre CGM. Insulin Carb Ratio of 1:10 ISF - 35 Target - 100 mg/dL Basal - 1 unit/hour - to increase after f/u with pump trainer   When criteria met for d/c drip, give Lantus 1-2 hours prior to the discontinuation of drip. Lantus 30 units Q24H Novolog 0-9 units tidwc and hs Novolog 8 units tidwc for meal coverage insulin.  Will see pt in am.   Thank you. Lorenda Peck, RD, LDN, CDE Inpatient Diabetes Coordinator [phone]

## 2017-05-28 NOTE — Progress Notes (Signed)
Pt bladder scan revealed >575 mL

## 2017-05-28 NOTE — Progress Notes (Signed)
[facility] Attending Note  ADMISSION DATE:  05/27/2017  CONSULTATION DATE:  05/28/17  REFERRING MD:  ED  CHIEF COMPLAINT:  DKA  Presenting HPI:  21 y.o. IDDM and psych disease admitted from ED after presenting with hyperglycemia.  Per mom he has had hyperglycemia that continues to recur despite using insulin pump throughout day.  Nausea and NBNB emesis.  Had unwitnessed ground level fall outside next to car when waiting for mom to go to ED.  In ed noted to have K 7.0, glucose unreadable high, severe AGMA, ST 160s.  CTH and CT c-spine no pathology.  ED Tx w/insulin, calcium and bicarb.  Subjective:  No acute events since admission. Patient off Bicarb drip now. Patient reports only mild pain in his left neck. Denies any nausea or difficulty breathing.  Review of Systems:  Unable to obtain given baseline mental status.  Temp:  [97.7 F (36.5 C)-98.8 F (37.1 C)] 98.8 F (37.1 C) (07/24 0800) Pulse Rate:  [145-168] 163 (07/24 0344) Resp:  [24-39] 24 (07/24 0600) BP: (113-141)/(47-108) 133/108 (07/24 0600) SpO2:  [95 %-100 %] 99 % (07/24 0600) Weight:  [129 lb 10.1 oz (58.8 kg)] 129 lb 10.1 oz (58.8 kg) (07/24 0442)  General:  Mother at bedside. IAwake. No distress. Integument:  Warm & dry. No rash on exposed skin. HEENT:  Pupils symmetric. No scleral icterus. Dry mucous membranes. No pain with palpation of floor of mouth. No tooth abscess or gingival abnormality on exam. Neurological:  Cranial nerves grossly intact. Following commands. No meningismus. Musculoskeletal:  No joint effusion or erythema appreciated. Symmetric muscle bulk. Minimal Left paraspinal cervical muscle tenderness to motion and palpation. Pulmonary:  Clear to auscultation. Normal work of breathing on room air.. Cardiovascular:  Regular Rhythm and tachycardic. No appreciable JVD. Normal S1 & S2. Telemetry:  Sinus Tachycardia. Abdomen:  Soft. Nondistended. Normoactive bowel  sounds. Genitourinary:  Examined with mother at bedside. Circumcised male. No crepitance, inguinal lymphadenopathy, or pain with palpation of testes. No penile discharge.  LINES/TUBES: PIV  CBC Latest Ref Rng & Units 05/28/2017 05/27/2017 01/29/2017  WBC 4.0 - 10.5 K/uL - 41.0(H) 6.2  Hemoglobin 13.0 - 17.0 g/dL 17.7(H) 15.2 15.7  Hematocrit 39.0 - 52.0 % 52.0 45.7 45.6  Platelets 150 - 400 K/uL - 411(H) 288   CBG (last 3)   Recent Labs  05/28/17 0556 05/28/17 0651 05/28/17 0750  GLUCAP 342* 252* 365*     BMP Latest Ref Rng & Units 05/28/2017 05/28/2017 01/29/2017  Glucose 65 - 99 mg/dL 512(HH) >700(HH) 213(H)  BUN 6 - 20 mg/dL 36(H) 51(H) 14  Creatinine 0.61 - 1.24 mg/dL 1.76(H) 1.20 0.83  Sodium 135 - 145 mmol/L 137 129(L) 138  Potassium 3.5 - 5.1 mmol/L 5.1 7.0(HH) 3.7  Chloride 101 - 111 mmol/L 107 100(L) 105  CO2 22 - 32 mmol/L <7(L) - 23  Calcium 8.9 - 10.3 mg/dL 8.0(L) - 10.2    Hepatic Function Latest Ref Rng & Units 01/29/2017 01/13/2017 12/06/2016  Total Protein 6.5 - 8.1 g/dL 8.4(H) 6.6 6.8  Albumin 3.5 - 5.0 g/dL 4.7 3.9 4.3  AST 15 - 41 U/L 20 16 13   ALT 17 - 63 U/L 16(L) 16(L) 10  Alk Phosphatase 38 - 126 U/L 127(H) 84 84  Total Bilirubin 0.3 - 1.2 mg/dL 0.8 0.5 0.3    IMAGING/STUDIES: CT HEAD/C-SPINE W/O 7/24: IMPRESSION: 1.  No acute intracranial abnormality.  No skull fracture. 2. No fracture or subluxation of the cervical spine  allowing for mild motion artifact.  PORT CXR 7/24:  Personally reviewed by me. No focal opacity or effusion appreciated.  MICROBIOLOGY: MRSA PCR 7/24:  Negative   ANTIBIOTICS: None.  SIGNIFICANT EVENTS: 07/24 - Admit  ASSESSMENT/PLAN:  21 y.o. Male admitted with DKA and known history of psychiatric illness.  1. NGF:[id number] insulin drip and DKA protocol. 2. Acute Renal Failure:  Likely prerenal. Monitoring urine output & trending renal function. 3. Hyperkalemia:  Resolved.  Trending electrolytes per DKA  protocol. 4. Leukocytosis:  Likely reactive. No evidence of infectious source. Checking Procalcitonin. Holding on antibiotics for now. 5. S/P Fall:  No evidence of ligamentous injury. Discontinuing cervical collar. 6. Sinus Tachycardia:  Likely secondary to dehydration. Continuing to monitor patient on telemetry. 7. H/O Multiple Psychiatric Disorders:  Patient continuing on Depakote ER, Strattera, & Geodon home meds.  Prophylaxis:  Lovenox [place] q24hr.  Diet:  Ice chips and water only. Code Status:  Full Code per previous physician discussions. Disposition:  Remains in ICU on insulin infusion. Family Update:  Mother updated during exam today.  I have personally spent an additional total of 22 minutes of critical care time today caring for the patient, updating family at bedside, & reviewing the patient's electronic medical record.  Sonia Baller Ashok Cordia, M.D. [facility] Pulmonary & Critical Care Pager:  [phone] After 3pm or if no response, call [phone] 8:29 AM 05/28/17

## 2017-05-28 NOTE — Care Management Note (Signed)
Case Management Note  Patient Details  Name: Dylan Butler MRN: [id number] Date of Birth: 03/23/1996  Subjective/Objective:     dka with fall               Action/Plan: Date:  May 28, 2017 Chart reviewed for concurrent status and case management needs. Will continue to follow patient progress. Discharge Planning: following for needs Expected discharge date: [id number] Dylan Butler, Dylan Butler, Dylan Butler, Dylan Butler   [phone]  Expected Discharge Date:                  Expected Discharge Plan:  Home/Self Care  In-House Referral:     Discharge planning Services  CM Consult  Post Acute Care Choice:    Choice offered to:     DME Arranged:    DME Agency:     HH Arranged:    HH Agency:     Status of Service:  In process, will continue to follow  If discussed at Long Length of Stay Meetings, dates discussed:    Additional Comments:  Dylan Butler, Dylan Brazil Lynn, RN 05/28/2017, 8:00 AM

## 2017-05-28 NOTE — Progress Notes (Signed)
Notified ELink MD that pt's CO2 and anion gap had closed per DKA protocol, advised to continue insulin gtt until CBG consistently <200.  VSS, will continue to monitor.

## 2017-05-28 NOTE — Progress Notes (Signed)
CRITICAL VALUE ALERT  Critical Value:  Serum Osmolality 340   Procalcitonin 15  Date & Time Notied:  05/28/2017 0850  Provider Notified: Jamison NeighborNestor MD  Orders Received/Actions taken: Orders for cultures and CT

## 2017-05-28 NOTE — H&P (Addendum)
PULMONARY / CRITICAL CARE MEDICINE   Name: Dylan Butler MRN: [id number] DOB: 1996/07/14    ADMISSION DATE:  05/27/2017 CONSULTATION DATE:  05/28/17  REFERRING MD:  ED  CHIEF COMPLAINT:  Hi blood sugar  HISTORY OF PRESENT ILLNESS:   21 yo IDDM and psych disease admitted from ED after presenting with hyperglycemia.  Per mom he has had hyperglycemia that continues to recur despite using insulin pump throughout day.  Nausea and NBNB emesis.  Had unwitnessed ground level fall outside next to car when waiting for mom to go to ED.  In ed noted to have K 7.0, glucose unreadable high, severe AGMA, ST 160s.  CTH and CT c-spine no pathology.  ED Tx w/insulin, calcium and bicarb.  PAST MEDICAL HISTORY :  He  has a past medical history of ADHD (attention deficit hyperactivity disorder); Anxiety; Bipolar 1 disorder ([place]); Diabetes mellitus without complication ([place]); Multiple personality disorder; ODD (oppositional defiant disorder); and Schizophrenia ([place]).  PAST SURGICAL HISTORY: He  has a past surgical history that includes Hernia repair.  Allergies  Allergen Reactions  . Other Shortness Of Breath and Nausea And Vomiting    Honey Mustard    No current facility-administered medications on file prior to encounter.    Current Outpatient Prescriptions on File Prior to Encounter  Medication Sig  . acetaminophen (TYLENOL) 500 MG tablet Take 500 mg by mouth every 6 (six) hours as needed for headache.  . fluticasone (FLONASE) 50 MCG/ACT nasal spray Place 2 sprays into both nostrils daily.  . insulin lispro (HUMALOG KWIKPEN) 100 UNIT/ML KiwkPen 15-25 Units Subcutaneous 3 times daily before meals as directed (Patient taking differently: Inject 8-12 Units into the skin 3 (three) times daily. Sliding scale)  . ziprasidone (GEODON) 80 MG capsule Take 160 mg by mouth at bedtime.   Marland Kitchen amantadine (SYMMETREL) 100 MG capsule Take 1 capsule (100 mg total) by mouth at bedtime. (Patient not taking: Reported on  05/28/2017)  . atomoxetine (STRATTERA) 60 MG capsule Take 1 capsule (60 mg total) by mouth daily. (Patient not taking: Reported on 05/28/2017)  . blood glucose meter kit and supplies KIT Dispense based on patient and insurance preference. Use up to four times daily as directed. (FOR ICD-9 250.00, 250.01).  . cetirizine (ZYRTEC) 10 MG tablet Take 1 tablet (10 mg total) by mouth daily. (Patient not taking: Reported on 05/28/2017)  . glucose blood (GLUCOSE METER TEST) test strip Use as instructed for 4 times daily testing of blood glucose.  Marland Kitchen guanFACINE (TENEX) 2 MG tablet Take 2 tablets (4 mg total) by mouth at bedtime. (Patient not taking: Reported on 05/28/2017)  . Insulin Glargine (LANTUS SOLOSTAR) 100 UNIT/ML Solostar Pen Inject 40 Units into the skin daily. (Patient not taking: Reported on 05/28/2017)  . Insulin Pen Needle 31G X 6 MM MISC 1 Device by Does not apply route daily at 8 pm.  . ondansetron (ZOFRAN) 4 MG tablet Take 1 tablet (4 mg total) by mouth every 8 (eight) hours as needed for nausea or vomiting. (Patient not taking: Reported on 05/28/2017)  . traZODone (DESYREL) 150 MG tablet Take 1.5 tablets (225 mg total) by mouth at bedtime. (Patient not taking: Reported on 05/28/2017)  . ziprasidone (GEODON) 60 MG capsule Take 2 capsules (120 mg total) by mouth at bedtime. (Patient not taking: Reported on 05/28/2017)    FAMILY HISTORY:  His indicated that his mother is alive. He indicated that the status of his maternal grandmother is unknown.  family Hx cad and cancer  SOCIAL HISTORY: He  reports that he has never smoked. He has never used smokeless tobacco. He reports that he does not drink alcohol or use drugs.  REVIEW OF SYSTEMS:   Bruising rt face after fall No fevers, chills, diarrhea, cough, sputum All other systems reviewed and engative  SUBJECTIVE:  Nausea  VITAL SIGNS: BP 132/69 (BP Location: Right Arm)   Pulse (!) 163   Temp 97.7 F (36.5 C) (Oral)   Resp (!) 39   SpO2 99%    HEMODYNAMICS:    VENTILATOR SETTINGS:    INTAKE / OUTPUT: No intake/output data recorded.  PHYSICAL EXAMINATION: General:  Ill appearing Neuro:  Caox3, mae, non-focal HEENT:  Emomi, right face bruised, perrla, dry mucous membranes Cardiovascular:  Tachy, regular no cce, no murmur, good refill Lungs:  cta no wheezing no rales, rapid rate good volume Abdomen:  +bs, guarding, mild diffuse ttp, no rebound, not rigid, unabel to assess hsm or masses Musculoskeletal:  No red/warm/swollen/deformed joints Skin:  Warm dry intact no rash  LABS:  BMET  Recent Labs Lab 05/28/17 0010 05/28/17 0324  NA 129* 137  K 7.0* 5.1  CL 100* 107  CO2  --  <7*  BUN 51* 36*  CREATININE 1.20 1.76*  GLUCOSE >700* 512*    Electrolytes  Recent Labs Lab 05/28/17 0324  CALCIUM 8.0*    CBC  Recent Labs Lab 05/27/17 2319 05/28/17 0010  WBC 41.0*  --   HGB 15.2 17.7*  HCT 45.7 52.0  PLT 411*  --     Coag's No results for input(s): APTT, INR in the last 168 hours.  Sepsis Markers No results for input(s): LATICACIDVEN, PROCALCITON, O2SATVEN in the last 168 hours.  ABG  Recent Labs Lab 05/27/17 2340 05/28/17 0310  PHART 6.967* 7.218*  PCO2ART BELOW REPORTABLE RANGE.  --   PO2ART 132* 117*    Liver Enzymes No results for input(s): AST, ALT, ALKPHOS, BILITOT, ALBUMIN in the last 168 hours.  Cardiac Enzymes No results for input(s): TROPONINI, PROBNP in the last 168 hours.  Glucose  Recent Labs Lab 05/27/17 2328 05/28/17 0138 05/28/17 0255  GLUCAP >600* >600* 590*    Imaging Ct Head Wo Contrast  Result Date: 05/28/2017 CLINICAL DATA:  Unwitnessed fall.  Altered mental status. EXAM: CT HEAD WITHOUT CONTRAST CT CERVICAL SPINE WITHOUT CONTRAST TECHNIQUE: Multidetector CT imaging of the head and cervical spine was performed following the standard protocol without intravenous contrast. Multiplanar CT image reconstructions of the cervical spine were also generated.  COMPARISON:  None. FINDINGS: CT HEAD FINDINGS Brain: No evidence of acute infarction, hemorrhage, hydrocephalus, extra-axial collection or mass lesion/mass effect. Vascular: No hyperdense vessel or unexpected calcification. Skull: No skull fracture. Sinuses/Orbits: Paranasal sinuses are well-aerated. Visualized orbits are normal. Hypoplastic cleft mastoid air cells. Other: None. CT CERVICAL SPINE FINDINGS Mild motion artifact. Alignment: Patient's head is tilted. No traumatic subluxation. Facets are normally aligned. Skull base and vertebrae: No evidence of acute fracture allowing for patient motion. Vertebral body heights are normal. Soft tissues and spinal canal: No prevertebral fluid or swelling. No visible canal hematoma. Disc levels:  Normal. Upper chest: Negative. Other: None. IMPRESSION: 1.  No acute intracranial abnormality.  No skull fracture. 2. No fracture or subluxation of the cervical spine allowing for mild motion artifact. Electronically Signed   By: Jeb Levering M.D.   On: 05/28/2017 00:49   Ct Cervical Spine Wo Contrast  Result Date: 05/28/2017 CLINICAL DATA:  Unwitnessed fall.  Altered mental status. EXAM: CT  HEAD WITHOUT CONTRAST CT CERVICAL SPINE WITHOUT CONTRAST TECHNIQUE: Multidetector CT imaging of the head and cervical spine was performed following the standard protocol without intravenous contrast. Multiplanar CT image reconstructions of the cervical spine were also generated. COMPARISON:  None. FINDINGS: CT HEAD FINDINGS Brain: No evidence of acute infarction, hemorrhage, hydrocephalus, extra-axial collection or mass lesion/mass effect. Vascular: No hyperdense vessel or unexpected calcification. Skull: No skull fracture. Sinuses/Orbits: Paranasal sinuses are well-aerated. Visualized orbits are normal. Hypoplastic cleft mastoid air cells. Other: None. CT CERVICAL SPINE FINDINGS Mild motion artifact. Alignment: Patient's head is tilted. No traumatic subluxation. Facets are normally  aligned. Skull base and vertebrae: No evidence of acute fracture allowing for patient motion. Vertebral body heights are normal. Soft tissues and spinal canal: No prevertebral fluid or swelling. No visible canal hematoma. Disc levels:  Normal. Upper chest: Negative. Other: None. IMPRESSION: 1.  No acute intracranial abnormality.  No skull fracture. 2. No fracture or subluxation of the cervical spine allowing for mild motion artifact. Electronically Signed   By: Jeb Levering M.D.   On: 05/28/2017 00:49   Dg Chest Portable 1 View  Result Date: 05/28/2017 CLINICAL DATA:  Hyperglycemia, nausea and vomiting. 12 hours duration. EXAM: PORTABLE CHEST 1 VIEW COMPARISON:  11/26/2016 FINDINGS: A single AP portable view of the chest demonstrates no focal airspace consolidation or alveolar edema. The lungs are grossly clear. There is no large effusion or pneumothorax. Cardiac and mediastinal contours appear unremarkable. IMPRESSION: No active disease. Electronically Signed   By: Andreas Newport M.D.   On: 05/28/2017 02:38     STUDIES:    CULTURES:   ANTIBIOTICS: none  SIGNIFICANT EVENTS:   LINES/TUBES:   DISCUSSION: No obvious infectous source (cns, lung, soft tissue, gu, cvs) except GI but could be 2/2 dka.  ASSESSMENT / PLAN:  PULMONARY A: Compensatory respiratory alkalosis P:   Monitor resp status  CARDIOVASCULAR A:  Sinus tach 2/2 hypovolemia 2/2 DKA diuresis P:  IVFs - got 3L bolus C/w crystalloid  RENAL A:   AGMA 2/2 DKA, AKI likely prerenal, hyperkalemia P:   pH improved with insulin and bicarb, d/c bicarb c/w LR until switch to d51/2NS K Improved after insulin, bicabr, calcium, monitor lytes Monitor renal fxn  GASTROINTESTINAL A:   N/V likely 2/2 DKA P:   NPO  HEMATOLOGIC A:   WBC 41 is concerning for malignancy or cdiff but liekly reactive P:  Serial cbc  INFECTIOUS A:   No source identified P:   Hold ABX  ENDOCRINE A:   DKA   P:   IV insulin,  serial lytes, IVFs, NPO, swithc to D51/2NS when glucose <250, insulin gtt and NPO until AG closed  NEUROLOGIC/Pysch A:   Stable psych disease P:   RASS goal: 0 C/w home meds  MSK A: fall, wearing c-collar, c-spine CT wnl  P:  clear once distracting condition (N/V/DKA) resolved  FAMILY  - Updates: mom updated in ed bedsdie  - Inter-disciplinary family meet or Palliative Care meeting due by:  day 7  Full Code  Condition: critical  Prognosis guarded  Upon my evaluation, this patient had a high probability of imminent or life-threatening deterioration due to DKA, hyperkalemia, severe acidosis  high complexity decision making to assess, manipulate, and support vital organ system failure including repeating ABG and lytes, adjusting bicarb drip, ordering IV calcium  I have personally provided 60 minutes of critical care time exclusive of time spent on separately billable procedures. Time includes review of laboratory data ABG/BMP,  radiology results w/independent review of CXR, discussion with ED and ICU RN, and monitoring for potential decompensation of pH and hyperkalemia.  Hanley Hays, MD  Pulmonary and [place] Pager: [phone]  05/28/2017, 4:28 AM

## 2017-05-28 NOTE — Progress Notes (Signed)
Pharmacy Antibiotic Note  Dylan FergusonKaleb Butler is a 21 y.o. male admitted on 05/27/2017 with sepsis.  Pharmacy has been consulted for vanc/Zosyn dosing.  Plan: Vancomycin 750mg  IV every 12 hours.  Goal trough 15-20 mcg/mL. Zosyn 3.375g IV q8h (4 hour infusion).  Note that MRSA PCR is negative so likely can already discontinue vanc  Height: 5\' 2"  (157.5 cm) Weight: 129 lb 10.1 oz (58.8 kg) IBW/kg (Calculated) : 54.6  Temp (24hrs), Avg:98.3 F (36.8 C), Min:97.7 F (36.5 C), Max:98.8 F (37.1 C)   Recent Labs Lab 05/27/17 2319 05/28/17 0010 05/28/17 0324 05/28/17 0841  WBC 41.0*  --   --   --   CREATININE  --  1.20 1.76* 1.24    Estimated Creatinine Clearance: 73.4 mL/min (by C-G formula based on SCr of 1.24 mg/dL).    Allergies  Allergen Reactions  . Other Shortness Of Breath and Nausea And Vomiting    Honey Mustard    Thank you for allowing pharmacy to be a part of this patient's care.   Hessie KnowsJustin M Ghali Morissette, PharmD, BCPS Pager [phone] 05/28/2017 10:29 AM

## 2017-05-29 DIAGNOSIS — N179 Acute kidney failure, unspecified: Secondary | ICD-10-CM

## 2017-05-29 DIAGNOSIS — E1010 Type 1 diabetes mellitus with ketoacidosis without coma: Secondary | ICD-10-CM

## 2017-05-29 LAB — CBC WITH DIFFERENTIAL/PLATELET
Basophils Absolute: 0 10*3/uL (ref 0.0–0.1)
Basophils Relative: 0 %
EOS PCT: 0 %
Eosinophils Absolute: 0 10*3/uL (ref 0.0–0.7)
HCT: 35.3 % — ABNORMAL LOW (ref 39.0–52.0)
HEMOGLOBIN: 12.5 g/dL — AB (ref 13.0–17.0)
LYMPHS ABS: 2.4 10*3/uL (ref 0.7–4.0)
LYMPHS PCT: 15 %
MCH: 27.7 pg (ref 26.0–34.0)
MCHC: 35.4 g/dL (ref 30.0–36.0)
MCV: 78.1 fL (ref 78.0–100.0)
Monocytes Absolute: 1 10*3/uL (ref 0.1–1.0)
Monocytes Relative: 6 %
Neutro Abs: 13.1 10*3/uL — ABNORMAL HIGH (ref 1.7–7.7)
Neutrophils Relative %: 79 %
PLATELETS: 208 10*3/uL (ref 150–400)
RBC: 4.52 MIL/uL (ref 4.22–5.81)
RDW: 13.1 % (ref 11.5–15.5)
WBC: 16.5 10*3/uL — AB (ref 4.0–10.5)

## 2017-05-29 LAB — MAGNESIUM: Magnesium: 1.9 mg/dL (ref 1.7–2.4)

## 2017-05-29 LAB — URINE CULTURE
Culture: NO GROWTH
SPECIAL REQUESTS: NORMAL

## 2017-05-29 LAB — GLUCOSE, CAPILLARY
GLUCOSE-CAPILLARY: 114 mg/dL — AB (ref 65–99)
GLUCOSE-CAPILLARY: 136 mg/dL — AB (ref 65–99)
GLUCOSE-CAPILLARY: 137 mg/dL — AB (ref 65–99)
GLUCOSE-CAPILLARY: 156 mg/dL — AB (ref 65–99)
GLUCOSE-CAPILLARY: 168 mg/dL — AB (ref 65–99)
GLUCOSE-CAPILLARY: 451 mg/dL — AB (ref 65–99)
Glucose-Capillary: 166 mg/dL — ABNORMAL HIGH (ref 65–99)
Glucose-Capillary: 137 mg/dL — ABNORMAL HIGH (ref 65–99)
Glucose-Capillary: 211 mg/dL — ABNORMAL HIGH (ref 65–99)
Glucose-Capillary: 348 mg/dL — ABNORMAL HIGH (ref 65–99)
Glucose-Capillary: 437 mg/dL — ABNORMAL HIGH (ref 65–99)

## 2017-05-29 LAB — BASIC METABOLIC PANEL
Anion gap: 10 (ref 5–15)
BUN: 9 mg/dL (ref 6–20)
CHLORIDE: 106 mmol/L (ref 101–111)
CO2: 24 mmol/L (ref 22–32)
Calcium: 8.5 mg/dL — ABNORMAL LOW (ref 8.9–10.3)
Creatinine, Ser: 0.83 mg/dL (ref 0.61–1.24)
GFR calc Af Amer: >60 mL/min (ref >60)
GFR calc non Af Amer: >60 mL/min (ref >60)
GLUCOSE: 167 mg/dL — AB (ref 65–99)
POTASSIUM: 3.3 mmol/L — AB (ref 3.5–5.1)
SODIUM: 140 mmol/L (ref 135–145)

## 2017-05-29 LAB — PHOSPHORUS: Phosphorus: 2.6 mg/dL (ref 2.5–4.6)

## 2017-05-29 LAB — HIV ANTIBODY (ROUTINE TESTING W REFLEX): HIV Screen 4th Generation wRfx: NONREACTIVE

## 2017-05-29 LAB — PROCALCITONIN: Procalcitonin: 7.7 ng/mL

## 2017-05-29 LAB — GLUCOSE, RANDOM: GLUCOSE: 450 mg/dL — AB (ref 65–99)

## 2017-05-29 MED ORDER — INSULIN ASPART 100 UNIT/ML ~~LOC~~ SOLN: 2.0000 [IU] | SUBCUTANEOUS | Status: DC

## 2017-05-29 MED ORDER — LACTATED RINGERS IV SOLN
INTRAVENOUS | Status: DC
  Administered 2017-05-29 (×2): via INTRAVENOUS
  Administered 2017-05-30: 1000 mL via INTRAVENOUS

## 2017-05-29 MED ORDER — INSULIN ASPART 100 UNIT/ML ~~LOC~~ SOLN
0.0000 [IU] | Freq: Three times a day (TID) | SUBCUTANEOUS | Status: DC
  Administered 2017-05-29: 7 [IU] via SUBCUTANEOUS
  Administered 2017-05-29: 3 [IU] via SUBCUTANEOUS

## 2017-05-29 MED ORDER — INSULIN ASPART 100 UNIT/ML ~~LOC~~ SOLN: 0.0000 [IU] | Freq: Three times a day (TID) | SUBCUTANEOUS | Status: DC

## 2017-05-29 MED ORDER — INSULIN ASPART 100 UNIT/ML ~~LOC~~ SOLN: 0.0000 [IU] | Freq: Every day | SUBCUTANEOUS | Status: DC

## 2017-05-29 MED ORDER — INSULIN ASPART 100 UNIT/ML ~~LOC~~ SOLN: 1.0000 [IU] | SUBCUTANEOUS | Status: DC

## 2017-05-29 MED ORDER — INSULIN ASPART 100 UNIT/ML ~~LOC~~ SOLN
0.0000 [IU] | Freq: Three times a day (TID) | SUBCUTANEOUS | Status: DC
Start: 2017-05-29 — End: 2017-05-30
  Administered 2017-05-29: 4 [IU] via SUBCUTANEOUS
  Administered 2017-05-29: 15 [IU] via SUBCUTANEOUS
  Administered 2017-05-30: 7 [IU] via SUBCUTANEOUS
  Administered 2017-05-30: 3 [IU] via SUBCUTANEOUS

## 2017-05-29 MED ORDER — ACETAMINOPHEN 325 MG PO TABS: 650.0000 mg | ORAL_TABLET | Freq: Four times a day (QID) | ORAL | Status: DC | PRN

## 2017-05-29 MED ORDER — DEXTROSE 10 % IV SOLN: INTRAVENOUS | Status: DC | PRN

## 2017-05-29 MED ORDER — INSULIN GLARGINE 100 UNIT/ML ~~LOC~~ SOLN
15.0000 [IU] | SUBCUTANEOUS | Status: DC
  Administered 2017-05-29: 15 [IU] via SUBCUTANEOUS
  Filled 2017-05-29: qty 0.15

## 2017-05-29 MED ORDER — INSULIN GLARGINE 100 UNIT/ML ~~LOC~~ SOLN
15.0000 [IU] | Freq: Two times a day (BID) | SUBCUTANEOUS | Status: DC
  Administered 2017-05-29 – 2017-05-30 (×3): 15 [IU] via SUBCUTANEOUS
  Filled 2017-05-29 (×4): qty 0.15

## 2017-05-29 NOTE — Progress Notes (Signed)
Insulin gtt off. Notified ELink MD to confirm continuation of D5 1/2NS @125 , advised to continue although insulin gtt is off.  Last CBG 137, will continue to monitor.

## 2017-05-29 NOTE — Progress Notes (Signed)
eLink Physician-Brief Progress Note Patient Name: Dylan FergusonKaleb Lagos DOB: 06/10/1996 MRN: [id number]   Date of Service  05/29/2017  HPI/Events of Note  Long acting insulin added per diabetes coordinator recs > lantus 15u bid  eICU Interventions       Intervention Category Intermediate Interventions: Hyperglycemia - evaluation and treatment  Drusilla Wampole S. 05/29/2017, 3:26 PM

## 2017-05-29 NOTE — Progress Notes (Signed)
eLink Physician-Brief Progress Note Patient Name: Dylan FergusonKaleb Butler DOB: 03/29/1996 MRN: [id number]   Date of Service  05/29/2017  HPI/Events of Note  Several blood glucose levels < 200.  eICU Interventions  Will order: 1. D/C Insulin IV infusion. 2. Lantus + Novolog "tube feed" coverage + Novolog SSI.     Intervention Category Major Interventions: Hyperglycemia - active titration of insulin therapy  Sommer,Steven Dennard Nipugene 05/29/2017, 3:44 AM

## 2017-05-29 NOTE — Progress Notes (Signed)
Inpatient Diabetes Program Recommendations  AACE/ADA: New Consensus Statement on Inpatient Glycemic Control (2015)  Target Ranges:  Prepandial:   less than 140 mg/dL      Peak postprandial:   less than 180 mg/dL (1-2 hours)      Critically ill patients:  140 - 180 mg/dL   Lab Results  Component Value Date   GLUCAP 211 (H) 05/29/2017   HGBA1C 9.2 (H) 12/06/2016    Review of Glycemic Control  Transitioned off insulin drip this morning. Given Lantus 15 units prior to discontinuing insulin drip. Will not restart insulin pump while inpatient. When discharged, pt and Mom to f/u with pump trainer for restart.  Inpatient Diabetes Program Recommendations:    Increase Lantus to 15 units bid Add Novolog 6 units tidwc for meal coverage insulin. Will likely need to titrate meal coverage insulin.  Long discussion with Mom this morning. States they were doing well with insulin pump until 7/23. From noon - HS, blood sugars were going up. Mom bolused several times, and reported she could not get blood sugars down. Went to MD office on 7/24, then admitted with DKA. Recommend pt and Mom f/u with CDE pump trainer to restart insulin pump.   Continue to follow while inpatient.  Thank you. Ailene Ardshonda Kieffer Blatz, RD, LDN, CDE Inpatient Diabetes Coordinator [phone]

## 2017-05-29 NOTE — Progress Notes (Signed)
eLink Physician-Brief Progress Note Patient Name: Dylan Butler DOB: 12/16/1995 MRN: [id number]   Date of Service  05/29/2017  HPI/Events of Note  SSI adjusted for CBG > 400. Pm lantus already given  eICU Interventions       Intervention Category Intermediate Interventions: Hyperglycemia - evaluation and treatment  Dunya Meiners S. 05/29/2017, 4:50 PM

## 2017-05-29 NOTE — Progress Notes (Signed)
[facility] Attending Note  ADMISSION DATE:  05/27/2017  CONSULTATION DATE:  05/28/17  REFERRING MD:  ED  CHIEF COMPLAINT:  DKA  Presenting HPI:  21 y.o. IDDM and psych disease admitted from ED after presenting with hyperglycemia.  Per mom he has had hyperglycemia that continues to recur despite using insulin pump Butler day.  Nausea and NBNB emesis.  Had unwitnessed ground level fall outside next to car when waiting for mom to go to ED.  In ed noted to have K 7.0, glucose unreadable high, severe AGMA, ST 160s.  CTH and CT c-spine no pathology.  ED Tx w/insulin, calcium and bicarb.  Subjective:  Patient transitioned off of insulin drip earlier this morning. He denies any chest pain, difficulty breathing, or nausea. Poor historian at baseline.  Review of Systems:  Unable to obtain given baseline mental status.  Temp:  [97.7 F (36.5 C)-98.8 F (37.1 C)] 98.5 F (36.9 C) (07/25 0345) Resp:  [14-28] 20 (07/25 0700) BP: (104-135)/(39-74) 105/41 (07/25 0700) SpO2:  [97 %-100 %] 99 % (07/25 0700)   General:  Sleeping until awoken. No distress. Mother sleeping in chair at bedside.  Integument:  Warm & dry. No rash on exposed skin. Extremities:  No cyanosis or clubbing.  HEENT:  Moist mucus membranes. No oral ulcers. No scleral injection or icterus.  Cardiovascular:  Regular rhythm & tachycardic. No edema. No appreciable JVD.  Pulmonary:  Clear bilaterally to auscultation. Normal work of breathing on room air. Abdomen: Soft. Normal bowel sounds. Nondistended.Nontenderr. Musculoskeletal:  Normal bulk and tone. No joint deformity or effusion appreciated. Mild pain of left trapezius muscle on physical exam.  LINES/TUBES: PIV  CBC Latest Ref Rng & Units 05/29/2017 05/28/2017 05/27/2017  WBC 4.0 - 10.5 K/uL 16.5(H) - 41.0(H)  Hemoglobin 13.0 - 17.0 g/dL 12.5(L) 17.7(H) 15.2  Hematocrit 39.0 - 52.0 % 35.3(L) 52.0 45.7  Platelets 150 - 400 K/uL 208 - 411(H)   CBG  (last 3)   Recent Labs  05/29/17 0334 05/29/17 0434 05/29/17 0534  GLUCAP 136* 137* 137*     BMP Latest Ref Rng & Units 05/29/2017 05/28/2017 05/28/2017  Glucose 65 - 99 mg/dL 167(H) 220(H) 145(H)  BUN 6 - 20 mg/dL 9 12 15   Creatinine 0.61 - 1.24 mg/dL 0.83 0.86 0.85  Sodium 135 - 145 mmol/L 140 137 139  Potassium 3.5 - 5.1 mmol/L 3.3(L) 3.2(L) 4.0  Chloride 101 - 111 mmol/L 106 107 107  CO2 22 - 32 mmol/L 24 21(L) 22  Calcium 8.9 - 10.3 mg/dL 8.5(L) 8.1(L) 8.2(L)    Hepatic Function Latest Ref Rng & Units 01/29/2017 01/13/2017 12/06/2016  Total Protein 6.5 - 8.1 g/dL 8.4(H) 6.6 6.8  Albumin 3.5 - 5.0 g/dL 4.7 3.9 4.3  AST 15 - 41 U/L 20 16 13   ALT 17 - 63 U/L 16(L) 16(L) 10  Alk Phosphatase 38 - 126 U/L 127(H) 84 84  Total Bilirubin 0.3 - 1.2 mg/dL 0.8 0.5 0.3    IMAGING/STUDIES: CT HEAD/C-SPINE W/O 7/24: IMPRESSION: 1.  No acute intracranial abnormality.  No skull fracture. 2. No fracture or subluxation of the cervical spine allowing for mild motion artifact.  PORT CXR 7/24:  Previously reviewed by me. No focal opacity or effusion appreciated.  MAXILLOFACIAL CT W/O 7/24: IMPRESSION: 1. Dental carie involving the second left maxillary molar (tooth #15). No evidence of periapical abscess involving any of the teeth. 2. Otherwise normal examination.  MICROBIOLOGY: MRSA PCR 7/24:  Negative  Blood Cultures  x2 7/24 >>> Urine Culture 7/24 >>>  ANTIBIOTICS: Vancomycin 7/24 >>> Zosyn 7/24 >>>  SIGNIFICANT EVENTS: 07/24 - Admit 07/25 - Transitioned off insulin drip approximately 6am  ASSESSMENT/PLAN:  21 y.o. Male with known history of diabetes mellitus type 1 presenting with DKA. Patient also has known history of psychiatric illness. Etiology for acute decompensation of his diabetes is unclear. Certainly an infectious process is possible with his leukocytosis and elevated Procalcitonin. Patient seems to be improving rapidly.  1. DKA: Resolving. Continuing Lantus  subcutaneous daily. Transitioning to Accu-Cheks every before meals & at bedtime with sensitive sliding scale algorithm for correction. Holding on restarting patient's home insulin pump for now.Transitioning off dextrose IV fluid to lactated Ringer's. 2. Leukocytosis: Questionable sepsis. Unclear source of infection. Trending Procalcitonin per algorithm. Continuing empiric Zosyn and vancomycin day #2 fWhile awaiting urineAnd blood cultures. 3. Acute renal failure: Resolving. Likely prerenal secondary to dehydration. 4. Hyperkalemia: Resolved. Likely secondary to acidosis. 5. Hypokalemia: Mild. Monitoring electrolytes daily.Transitioning over to [facility] IVF.  6. Status post fall: No evidence of fracture on imaging. No evidence of ligamentous injury on exam. Mild left trapezius pain. 7. History of psychiatric illness: Continuing Depakote ER, Strattera, and Geodon. 8. Sinus tachycardia: Improving. Secondary to dehydration. Discontinuing telemetry monitoring.  Prophylaxis:  Lovenox [place] q24hr & SCDs.  Diet:  Advancing to carb modified diet as tolerated. Code Status:  Full Code per previous physician discussions. Disposition:  Transferring patient to medical floor bed for further care and stabilization. Family Update:  Mother updated during my exam today.  I have spent a total of 37 minutes of time today caring for the patient, reviewing the patient's electronic medical record, and with more than 50% of that time spent coordinating transfer of care with the patient as well as reviewing the continuing plan of care with the patient and his mother at bedside.  Sonia Baller Ashok Cordia, M.D. [facility] Pulmonary & Critical Care Pager:  [phone] After 3pm or if no response, call [phone] 7:50 AM 05/29/17

## 2017-05-29 NOTE — Progress Notes (Signed)
Spoke with MD Byrum to address blood glucose of 451. He verbally told me to give 15units of Novalog now and recheck blood glucose in 4 hours. MD also stated for bedtime glucose check to use the resistant sliding scale instead of bedtime scale. Will inform night shift RN of this.

## 2017-05-30 ENCOUNTER — Telehealth: Payer: Self-pay | Admitting: *Deleted

## 2017-05-30 DIAGNOSIS — E872 Acidosis: Secondary | ICD-10-CM

## 2017-05-30 DIAGNOSIS — W19XXXA Unspecified fall, initial encounter: Secondary | ICD-10-CM

## 2017-05-30 DIAGNOSIS — K047 Periapical abscess without sinus: Secondary | ICD-10-CM

## 2017-05-30 DIAGNOSIS — F333 Major depressive disorder, recurrent, severe with psychotic symptoms: Secondary | ICD-10-CM

## 2017-05-30 DIAGNOSIS — E875 Hyperkalemia: Secondary | ICD-10-CM

## 2017-05-30 LAB — RENAL FUNCTION PANEL
ALBUMIN: 2.8 g/dL — AB (ref 3.5–5.0)
Anion gap: 8 (ref 5–15)
BUN: 8 mg/dL (ref 6–20)
CO2: 29 mmol/L (ref 22–32)
CREATININE: 0.7 mg/dL (ref 0.61–1.24)
Calcium: 9.1 mg/dL (ref 8.9–10.3)
Chloride: 106 mmol/L (ref 101–111)
GFR calc Af Amer: >60 mL/min (ref >60)
Glucose, Bld: 142 mg/dL — ABNORMAL HIGH (ref 65–99)
PHOSPHORUS: 3.7 mg/dL (ref 2.5–4.6)
Potassium: 3.1 mmol/L — ABNORMAL LOW (ref 3.5–5.1)
Sodium: 143 mmol/L (ref 135–145)

## 2017-05-30 LAB — PROCALCITONIN: PROCALCITONIN: 4.66 ng/mL

## 2017-05-30 LAB — MAGNESIUM: Magnesium: 1.6 mg/dL — ABNORMAL LOW (ref 1.7–2.4)

## 2017-05-30 LAB — GLUCOSE, CAPILLARY
GLUCOSE-CAPILLARY: 221 mg/dL — AB (ref 65–99)
Glucose-Capillary: 145 mg/dL — ABNORMAL HIGH (ref 65–99)

## 2017-05-30 LAB — PHOSPHORUS: Phosphorus: 3.6 mg/dL (ref 2.5–4.6)

## 2017-05-30 MED ORDER — AMOXICILLIN-POT CLAVULANATE 875-125 MG PO TABS
1.0000 | ORAL_TABLET | Freq: Two times a day (BID) | ORAL | Status: DC
  Administered 2017-05-30: 1 via ORAL
  Filled 2017-05-30: qty 1

## 2017-05-30 MED ORDER — AMOXICILLIN-POT CLAVULANATE 875-125 MG PO TABS
1.0000 | ORAL_TABLET | Freq: Two times a day (BID) | ORAL | 0 refills | Status: DC
Start: 2017-05-30 — End: 2017-07-11

## 2017-05-30 MED ORDER — POTASSIUM CHLORIDE CRYS ER 20 MEQ PO TBCR
40.0000 meq | EXTENDED_RELEASE_TABLET | Freq: Once | ORAL | Status: AC
  Administered 2017-05-30: 40 meq via ORAL
  Filled 2017-05-30: qty 2

## 2017-05-30 MED ORDER — MAGNESIUM SULFATE 2 GM/50ML IV SOLN
2.0000 g | Freq: Once | INTRAVENOUS | Status: AC
  Administered 2017-05-30: 2 g via INTRAVENOUS
  Filled 2017-05-30: qty 50

## 2017-05-30 NOTE — Telephone Encounter (Signed)
I spoke with April, Araf's mom. She states he received Lantus about 9 AM this AM and she is ready to put his pump back on as he is discharged from the hospital.   He received 15 units Lantus about 9 AM this morning. His Basal Rate is 1 unit / hour so he typically receives 24 units per 24 hours.   He will need an additional 9 units from his basal rate which as about 35% of his typical basal rate setting.  I instructed her to go to Temp Basal, set duration of 20 hours at 30% of basal rate which will last until 10 AM tomorrow AM. I asked her to let me know if his BG go too high or too low and we would adjust further if needed.   I also explained to her NOT to go into Auto Mode until the Temp Basal time was completed as the pump is not aware of the Lantus insulin in his body. She plans to resume Auto Mode after they see Dr. Sharl MaKerr tomorrow at 1:20 PM

## 2017-05-30 NOTE — Progress Notes (Signed)
Discharge instructions were gone over with patient and family. Iv site was removed. Mother of patient applied insulin pump. Patient wheeled out with NT to car

## 2017-05-30 NOTE — Progress Notes (Signed)
Inpatient Diabetes Program Recommendations  AACE/ADA: New Consensus Statement on Inpatient Glycemic Control (2015)  Target Ranges:  Prepandial:   less than 140 mg/dL      Peak postprandial:   less than 180 mg/dL (1-2 hours)      Critically ill patients:  140 - 180 mg/dL   Lab Results  Component Value Date   GLUCAP 221 (H) 05/30/2017   HGBA1C 9.2 (H) 12/06/2016    Review of Glycemic Control  Pt's Mom spoke with Dr. Daune PerchKerr's office. MD wants Mom to restart pump at 50% of his basal rate. Will also apply Libre glucose monitor. Has MD appt in am. Pump trainer and CDE to assist Mom in making changes on his new pump.  Will need order for insulin pump prior to discharge.  Thank you. Ailene Ardshonda Consuello Lassalle, RD, LDN, CDE Inpatient Diabetes Coordinator [phone]

## 2017-05-30 NOTE — Discharge Summary (Signed)
Physician Discharge Summary  Dylan Butler YIF:[id number] DOB: 12-Sep-1996 DOA: 05/27/2017  PCP: System, Provider Not In  Admit date: 05/27/2017 Discharge date: 06/03/2017  Admitted From: Home Disposition: Home   Recommendations for Outpatient Follow-up:  1. Follow up with endocrinology and diabetes educator in 1-2 days 2. Follow up with dentist as soon as possible for evaluation of dental abscess; discharged on augmentin  Home Health: None new Equipment/Devices: None Discharge Condition: Stable CODE STATUS: Full Diet recommendation: Carbohydrate limited  Brief/Interim Summary: 21 y.o. IDDM and psych disease admitted from ED after presenting with hyperglycemia. Per mom he has had hyperglycemia that continues to recur despite using insulin pump throughout day. Nausea and NBNB emesis. Had unwitnessed ground level fall outside next to car when waiting for mom to go to ED. In ed noted to have K 7.0, glucose unreadable high, severe AGMA, ST 160s. CTH and CT c-spine no pathology. ED Tx w/insulin, calcium and bicarb.  He was admitted for DKA, improved and was transitioned to basal-bolus insulin. In consultation with the patient's endocrinologist's office, insulin pump was restarted prior to discharge. Significant education was provided daily.   Discharge Diagnoses:  Principal Problem:   DKA, type 1 ([place]) Active Problems:   Tachycardia   Nausea with vomiting   Major depressive disorder, recurrent episode, severe, with psychosis ([place])   Hyperkalemia   High anion gap metabolic acidosis   AKI (acute kidney injury) ([place])   Fall   Dental abscess  DKA: Resolved. Was given basal bolus, but will restart home insulin pump prior to discharge and arrange quick follow up with diabetes educator.   Sepsis due to dental abscess: Initially with unclear source, though pt has had dental pain worsening prior to admission that has improved with antibiotics (zosyn and vancomycin). Cultures negative.  -  Continue treatment for dental abscess, which is likely precipitant of refractory hyperglycemia/DKA.   Acute renal failure: Resolved. Likely prerenal secondary to dehydration.  Status post fall: No evidence of fracture on imaging. No evidence of ligamentous injury on exam. Mild left trapezius pain. Pain subsided.  Developmental delay/anxiety/depression/ADD: Cared for by mother. Continue home medications.   Discharge Instructions Discharge Instructions    Discharge instructions    Complete by:  As directed    You were admitted for DKA likely due to a dental abscess. This has improved with antibiotics and blood sugars have improved. You are stable for discharge with the following recommendations:  - Take augmentin (antibiotic) twice daily for the dental abscess.  - You must be seen by a dentist in the next 1 - 2 weeks. - Follow up with your outpatient endocrinologist and diabetes educator for ongoing insulin pump training.  - If you notice fever, worsening dental pain, uncontrolled blood sugars, or other worrying symptoms, seek medical attention right away.     Allergies as of 05/30/2017      Reactions   Other Shortness Of Breath, Nausea And Vomiting   Honey Mustard      Medication List    TAKE these medications   acetaminophen 500 MG tablet Commonly known as:  TYLENOL Take 500 mg by mouth every 6 (six) hours as needed for headache.   amantadine 100 MG capsule Commonly known as:  SYMMETREL Take 1 capsule (100 mg total) by mouth at bedtime.   amoxicillin-clavulanate 875-125 MG tablet Commonly known as:  AUGMENTIN Take 1 tablet by mouth every 12 (twelve) hours.   atomoxetine 80 MG capsule Commonly known as:  STRATTERA Take 80 mg by  mouth daily.   atomoxetine 60 MG capsule Commonly known as:  STRATTERA Take 1 capsule (60 mg total) by mouth daily.   blood glucose meter kit and supplies Kit Dispense based on patient and insurance preference. Use up to four times daily as  directed. (FOR ICD-9 250.00, 250.01).   cetirizine 10 MG tablet Commonly known as:  ZYRTEC Take 1 tablet (10 mg total) by mouth daily.   cloNIDine 0.2 MG tablet Commonly known as:  CATAPRES Take 0.2 mg by mouth daily.   divalproex 250 MG 24 hr tablet Commonly known as:  DEPAKOTE ER Take 500 mg by mouth daily.   fluticasone 50 MCG/ACT nasal spray Commonly known as:  FLONASE Place 2 sprays into both nostrils daily.   glucose blood test strip Commonly known as:  GLUCOSE METER TEST Use as instructed for 4 times daily testing of blood glucose.   guanFACINE 2 MG tablet Commonly known as:  TENEX Take 2 tablets (4 mg total) by mouth at bedtime.   Insulin Glargine 100 UNIT/ML Solostar Pen Commonly known as:  LANTUS SOLOSTAR Inject 40 Units into the skin daily.   insulin lispro 100 UNIT/ML KiwkPen Commonly known as:  HUMALOG KWIKPEN 15-25 Units Subcutaneous 3 times daily before meals as directed What changed:  how much to take  how to take this  when to take this  additional instructions   Insulin Pen Needle 31G X 6 MM Misc 1 Device by Does not apply route daily at 8 pm.   ondansetron 4 MG tablet Commonly known as:  ZOFRAN Take 1 tablet (4 mg total) by mouth every 8 (eight) hours as needed for nausea or vomiting.   traZODone 150 MG tablet Commonly known as:  DESYREL Take 1.5 tablets (225 mg total) by mouth at bedtime.   ziprasidone 80 MG capsule Commonly known as:  GEODON Take 160 mg by mouth at bedtime.   ziprasidone 60 MG capsule Commonly known as:  GEODON Take 2 capsules (120 mg total) by mouth at bedtime.      Follow-up Information    Delrae Rend, MD Follow up.   Specialty:  Endocrinology Contact information: [address] [place] [address] [phone]          Allergies  Allergen Reactions  . Other Shortness Of Breath and Nausea And Vomiting    Honey Mustard    Consultations:  PCCM primary through  7/25.  Procedures/Studies: Ct Head Wo Contrast  Result Date: 05/28/2017 CLINICAL DATA:  Unwitnessed fall.  Altered mental status. EXAM: CT HEAD WITHOUT CONTRAST CT CERVICAL SPINE WITHOUT CONTRAST TECHNIQUE: Multidetector CT imaging of the head and cervical spine was performed following the standard protocol without intravenous contrast. Multiplanar CT image reconstructions of the cervical spine were also generated. COMPARISON:  None. FINDINGS: CT HEAD FINDINGS Brain: No evidence of acute infarction, hemorrhage, hydrocephalus, extra-axial collection or mass lesion/mass effect. Vascular: No hyperdense vessel or unexpected calcification. Skull: No skull fracture. Sinuses/Orbits: Paranasal sinuses are well-aerated. Visualized orbits are normal. Hypoplastic cleft mastoid air cells. Other: None. CT CERVICAL SPINE FINDINGS Mild motion artifact. Alignment: Patient's head is tilted. No traumatic subluxation. Facets are normally aligned. Skull base and vertebrae: No evidence of acute fracture allowing for patient motion. Vertebral body heights are normal. Soft tissues and spinal canal: No prevertebral fluid or swelling. No visible canal hematoma. Disc levels:  Normal. Upper chest: Negative. Other: None. IMPRESSION: 1.  No acute intracranial abnormality.  No skull fracture. 2. No fracture or subluxation of  the cervical spine allowing for mild motion artifact. Electronically Signed   By: Jeb Levering M.D.   On: 05/28/2017 00:49   Ct Cervical Spine Wo Contrast  Result Date: 05/28/2017 CLINICAL DATA:  Unwitnessed fall.  Altered mental status. EXAM: CT HEAD WITHOUT CONTRAST CT CERVICAL SPINE WITHOUT CONTRAST TECHNIQUE: Multidetector CT imaging of the head and cervical spine was performed following the standard protocol without intravenous contrast. Multiplanar CT image reconstructions of the cervical spine were also generated. COMPARISON:  None. FINDINGS: CT HEAD FINDINGS Brain: No evidence of acute infarction,  hemorrhage, hydrocephalus, extra-axial collection or mass lesion/mass effect. Vascular: No hyperdense vessel or unexpected calcification. Skull: No skull fracture. Sinuses/Orbits: Paranasal sinuses are well-aerated. Visualized orbits are normal. Hypoplastic cleft mastoid air cells. Other: None. CT CERVICAL SPINE FINDINGS Mild motion artifact. Alignment: Patient's head is tilted. No traumatic subluxation. Facets are normally aligned. Skull base and vertebrae: No evidence of acute fracture allowing for patient motion. Vertebral body heights are normal. Soft tissues and spinal canal: No prevertebral fluid or swelling. No visible canal hematoma. Disc levels:  Normal. Upper chest: Negative. Other: None. IMPRESSION: 1.  No acute intracranial abnormality.  No skull fracture. 2. No fracture or subluxation of the cervical spine allowing for mild motion artifact. Electronically Signed   By: Jeb Levering M.D.   On: 05/28/2017 00:49   Dg Chest Portable 1 View  Result Date: 05/28/2017 CLINICAL DATA:  Hyperglycemia, nausea and vomiting. 12 hours duration. EXAM: PORTABLE CHEST 1 VIEW COMPARISON:  11/26/2016 FINDINGS: A single AP portable view of the chest demonstrates no focal airspace consolidation or alveolar edema. The lungs are grossly clear. There is no large effusion or pneumothorax. Cardiac and mediastinal contours appear unremarkable. IMPRESSION: No active disease. Electronically Signed   By: Andreas Newport M.D.   On: 05/28/2017 02:38   Ct Maxillofacial Wo Contrast  Result Date: 05/28/2017 CLINICAL DATA:  21 year old with ongoing unspecified tooth pain. Patient admitted for hyperglycemia and leukocytosis. EXAM: CT MAXILLOFACIAL WITHOUT CONTRAST TECHNIQUE: Multidetector CT imaging of the maxillofacial structures was performed. Multiplanar CT image reconstructions were also generated. COMPARISON:  None. FINDINGS: Osseous: No periapical lucencies involving any of the maxillary or mandibular teeth to suggest  abscess. Dental carie involving the second left maxillary molar (tooth # 15). No other visible caries. No facial bone fractures or intrinsic osseous abnormalities involving the facial bones. Temporomandibular joints intact with anatomic alignment. Orbits: Both orbits and both globes intact. Sinuses: Paranasal sinuses, bilateral mastoid air cells and bilateral middle ear cavities well-aerated. Left mastoid air cells underpneumatized. Soft tissues: Normal. Limited intracranial: Normal. IMPRESSION: 1. Dental carie involving the second left maxillary molar (tooth # 15). No evidence of periapical abscess involving any of the teeth. 2. Otherwise normal examination. Electronically Signed   By: Evangeline Dakin M.D.   On: 05/28/2017 14:05   Subjective: Had significant dental pain PTA, improved now. Eating well. No complaints. Mother states he is at his baseline mood and mentation.   Discharge Exam:   General: Pt is alert, awake, not in acute distress Mouth: anterior right mandibular molar fractured with tenderness around base of tooth. No fluctuance identified.  Cardiovascular: RRR, S1/S2 +, no rubs, no gallops Respiratory: CTA bilaterally, no wheezing, no rhonchi Abdominal: Soft, NT, ND, bowel sounds + Extremities: No edema, no cyanosis Neuro: Alert, oriented, conversant. Developmentally delayed.   Labs: Basic Metabolic Panel:  Recent Labs Lab 05/28/17 0841 05/28/17 1245 05/28/17 1910 05/28/17 2243 05/29/17 0542 05/29/17 1710 05/30/17 0617  NA 142 141  139 137 140  --  143  K 3.7 3.5 4.0 3.2* 3.3*  --  3.1*  CL 113* 112* 107 107 106  --  106  CO2 15* 19* 22 21* 24  --  29  GLUCOSE 239* 182* 145* 220* 167* 450* 142*  BUN 25* 21* _0 --  8  CREATININE 1.24 0.99 0.85 0.86 0.83  --  0.70  CALCIUM 8.4* 8.2* 8.2* 8.1* 8.5*  --  9.1  MG 1.8  --   --   --  1.9  --  1.6*  PHOS 2.0*  --   --   --  2.6  --  3.6  3.7   Liver Function Tests:  Recent Labs Lab 05/30/17 0617  ALBUMIN 2.8*    No results for input(s): LIPASE, AMYLASE in the last 168 hours. No results for input(s): AMMONIA in the last 168 hours. CBC:  Recent Labs Lab 05/27/17 2319 05/28/17 0010 05/29/17 0542  WBC 41.0*  --  16.5*  NEUTROABS  --   --  13.1*  HGB 15.2 17.7* 12.5*  HCT 45.7 52.0 35.3*  MCV 85.3  --  78.1  PLT 411*  --  208   Cardiac Enzymes: No results for input(s): CKTOTAL, CKMB, CKMBINDEX, TROPONINI in the last 168 hours. BNP: Invalid input(s): POCBNP CBG:  Recent Labs Lab 05/29/17 1638 05/29/17 1732 05/29/17 2101 05/30/17 0655 05/30/17 1143  GLUCAP 451* 437* 168* 145* 221*   D-Dimer No results for input(s): DDIMER in the last 72 hours. Hgb A1c No results for input(s): HGBA1C in the last 72 hours. Lipid Profile No results for input(s): CHOL, HDL, LDLCALC, TRIG, CHOLHDL, LDLDIRECT in the last 72 hours. Thyroid function studies No results for input(s): TSH, T4TOTAL, T3FREE, THYROIDAB in the last 72 hours.  Invalid input(s): FREET3 Anemia work up No results for input(s): VITAMINB12, FOLATE, FERRITIN, TIBC, IRON, RETICCTPCT in the last 72 hours. Urinalysis    Component Value Date/Time   COLORURINE YELLOW 05/27/2017 2347   APPEARANCEUR CLEAR 05/27/2017 2347   LABSPEC 1.018 05/27/2017 2347   PHURINE 5.0 05/27/2017 2347   GLUCOSEU >=500 (A) 05/27/2017 2347   HGBUR MODERATE (A) 05/27/2017 2347   BILIRUBINUR NEGATIVE 05/27/2017 2347   BILIRUBINUR negative 12/06/2016 1453   KETONESUR 80 (A) 05/27/2017 2347   PROTEINUR 30 (A) 05/27/2017 2347   UROBILINOGEN 0.2 12/06/2016 1453   NITRITE NEGATIVE 05/27/2017 2347   LEUKOCYTESUR NEGATIVE 05/27/2017 2347    Microbiology Recent Results (from the past 240 hour(s))  MRSA PCR Screening     Status: None   Collection Time: 05/28/17  4:34 AM  Result Value Ref Range Status   MRSA by PCR NEGATIVE NEGATIVE Final    Comment:        The GeneXpert MRSA Assay (FDA approved for NASAL specimens only), is one component of  a comprehensive MRSA colonization surveillance program. It is not intended to diagnose MRSA infection nor to guide or monitor treatment for MRSA infections.   Culture, blood (routine x 2)     Status: None   Collection Time: 05/28/17 11:05 AM  Result Value Ref Range Status   Specimen Description BLOOD RIGHT ANTECUBITAL  Final   Special Requests IN PEDIATRIC BOTTLE Blood Culture adequate volume  Final   Culture   Final    NO GROWTH 5 DAYS Performed at [facility], [place] [address], [place], [place] [address]    Report Status 06/02/2017 FINAL  Final  Culture, blood (routine x 2)  Status: None   Collection Time: 05/28/17 11:09 AM  Result Value Ref Range Status   Specimen Description BLOOD RIGHT HAND  Final   Special Requests IN PEDIATRIC BOTTLE Blood Culture adequate volume  Final   Culture   Final    NO GROWTH 5 DAYS Performed at [facility], [place] [address], [place], [place] [address]    Report Status 06/02/2017 FINAL  Final  Culture, Urine     Status: None   Collection Time: 05/28/17 12:09 PM  Result Value Ref Range Status   Specimen Description URINE, CLEAN CATCH  Final   Special Requests Normal  Final   Culture   Final    NO GROWTH Performed at [facility], [place] [address] [place], [place] [address]    Report Status 05/29/2017 FINAL  Final    Time coordinating discharge: Approximately 40 minutes  Vance Gather, MD  [facility] 06/03/2017, 4:36 PM Pager [phone]

## 2017-06-02 LAB — CULTURE, BLOOD (ROUTINE X 2)
CULTURE: NO GROWTH
Culture: NO GROWTH
SPECIAL REQUESTS: ADEQUATE
SPECIAL REQUESTS: ADEQUATE

## 2017-06-20 ENCOUNTER — Ambulatory Visit: Payer: Self-pay | Admitting: Family Medicine

## 2017-07-09 ENCOUNTER — Emergency Department (HOSPITAL_COMMUNITY): Payer: Medicaid Other

## 2017-07-09 ENCOUNTER — Encounter (HOSPITAL_COMMUNITY): Payer: Self-pay | Admitting: Emergency Medicine

## 2017-07-09 ENCOUNTER — Inpatient Hospital Stay (HOSPITAL_COMMUNITY)
Admission: EM | Admit: 2017-07-09 | Discharge: 2017-07-11 | DRG: 919 | Disposition: A | Discharge status: Home / Self Care | Payer: Medicaid Other | Attending: Internal Medicine | Admitting: Internal Medicine

## 2017-07-09 DIAGNOSIS — L03314 Cellulitis of groin: Secondary | ICD-10-CM | POA: Diagnosis present

## 2017-07-09 DIAGNOSIS — E872 Acidosis: Secondary | ICD-10-CM | POA: Diagnosis present

## 2017-07-09 DIAGNOSIS — A419 Sepsis, unspecified organism: Secondary | ICD-10-CM | POA: Diagnosis present

## 2017-07-09 DIAGNOSIS — F319 Bipolar disorder, unspecified: Secondary | ICD-10-CM | POA: Diagnosis present

## 2017-07-09 DIAGNOSIS — Z9641 Presence of insulin pump (external) (internal): Secondary | ICD-10-CM | POA: Diagnosis present

## 2017-07-09 DIAGNOSIS — T85694A Other mechanical complication of insulin pump, initial encounter: Principal | ICD-10-CM | POA: Diagnosis present

## 2017-07-09 DIAGNOSIS — E8729 Other acidosis: Secondary | ICD-10-CM | POA: Diagnosis present

## 2017-07-09 DIAGNOSIS — R112 Nausea with vomiting, unspecified: Secondary | ICD-10-CM | POA: Diagnosis present

## 2017-07-09 DIAGNOSIS — E1110 Type 2 diabetes mellitus with ketoacidosis without coma: Secondary | ICD-10-CM | POA: Diagnosis present

## 2017-07-09 DIAGNOSIS — E878 Other disorders of electrolyte and fluid balance, not elsewhere classified: Secondary | ICD-10-CM | POA: Diagnosis present

## 2017-07-09 DIAGNOSIS — I4581 Long QT syndrome: Secondary | ICD-10-CM | POA: Diagnosis present

## 2017-07-09 DIAGNOSIS — E860 Dehydration: Secondary | ICD-10-CM | POA: Diagnosis present

## 2017-07-09 DIAGNOSIS — Z833 Family history of diabetes mellitus: Secondary | ICD-10-CM | POA: Diagnosis not present

## 2017-07-09 DIAGNOSIS — E875 Hyperkalemia: Secondary | ICD-10-CM | POA: Diagnosis present

## 2017-07-09 DIAGNOSIS — F913 Oppositional defiant disorder: Secondary | ICD-10-CM | POA: Diagnosis present

## 2017-07-09 DIAGNOSIS — F909 Attention-deficit hyperactivity disorder, unspecified type: Secondary | ICD-10-CM | POA: Diagnosis present

## 2017-07-09 DIAGNOSIS — N179 Acute kidney failure, unspecified: Secondary | ICD-10-CM | POA: Diagnosis present

## 2017-07-09 DIAGNOSIS — G9341 Metabolic encephalopathy: Secondary | ICD-10-CM | POA: Diagnosis present

## 2017-07-09 DIAGNOSIS — E1010 Type 1 diabetes mellitus with ketoacidosis without coma: Secondary | ICD-10-CM | POA: Diagnosis present

## 2017-07-09 DIAGNOSIS — Z79899 Other long term (current) drug therapy: Secondary | ICD-10-CM | POA: Diagnosis not present

## 2017-07-09 DIAGNOSIS — Z91018 Allergy to other foods: Secondary | ICD-10-CM

## 2017-07-09 DIAGNOSIS — E871 Hypo-osmolality and hyponatremia: Secondary | ICD-10-CM | POA: Diagnosis present

## 2017-07-09 DIAGNOSIS — B49 Unspecified mycosis: Secondary | ICD-10-CM | POA: Diagnosis present

## 2017-07-09 DIAGNOSIS — F209 Schizophrenia, unspecified: Secondary | ICD-10-CM | POA: Diagnosis present

## 2017-07-09 DIAGNOSIS — Z9119 Patient's noncompliance with other medical treatment and regimen: Secondary | ICD-10-CM

## 2017-07-09 LAB — GLUCOSE, CAPILLARY
GLUCOSE-CAPILLARY: 437 mg/dL — AB (ref 65–99)
Glucose-Capillary: 232 mg/dL — ABNORMAL HIGH (ref 65–99)
Glucose-Capillary: 307 mg/dL — ABNORMAL HIGH (ref 65–99)
Glucose-Capillary: 340 mg/dL — ABNORMAL HIGH (ref 65–99)
Glucose-Capillary: 375 mg/dL — ABNORMAL HIGH (ref 65–99)

## 2017-07-09 LAB — I-STAT CHEM 8, ED
BUN: 52 mg/dL — AB (ref 6–20)
CALCIUM ION: 1.03 mmol/L — AB (ref 1.15–1.40)
CHLORIDE: 102 mmol/L (ref 101–111)
Creatinine, Ser: 1.9 mg/dL — ABNORMAL HIGH (ref 0.61–1.24)
Glucose, Bld: >700 mg/dL (ref 65–99)
HCT: 46 % (ref 39.0–52.0)
Hemoglobin: 15.6 g/dL (ref 13.0–17.0)
POTASSIUM: 7.4 mmol/L — AB (ref 3.5–5.1)
Sodium: 129 mmol/L — ABNORMAL LOW (ref 135–145)
TCO2: 8 mmol/L — ABNORMAL LOW (ref 22–32)

## 2017-07-09 LAB — CBC WITH DIFFERENTIAL/PLATELET
BASOS PCT: 0 %
Basophils Absolute: 0 10*3/uL (ref 0.0–0.1)
EOS ABS: 0 10*3/uL (ref 0.0–0.7)
EOS PCT: 0 %
HCT: 38.1 % — ABNORMAL LOW (ref 39.0–52.0)
HEMOGLOBIN: 12.9 g/dL — AB (ref 13.0–17.0)
LYMPHS PCT: 11 %
Lymphs Abs: 3.6 10*3/uL (ref 0.7–4.0)
MCH: 28.9 pg (ref 26.0–34.0)
MCHC: 33.9 g/dL (ref 30.0–36.0)
MCV: 85.2 fL (ref 78.0–100.0)
Monocytes Absolute: 1.6 10*3/uL — ABNORMAL HIGH (ref 0.1–1.0)
Monocytes Relative: 5 %
Neutro Abs: 27.7 10*3/uL — ABNORMAL HIGH (ref 1.7–7.7)
Neutrophils Relative %: 84 %
Platelets: 322 10*3/uL (ref 150–400)
RBC: 4.47 MIL/uL (ref 4.22–5.81)
RDW: 13.6 % (ref 11.5–15.5)
WBC: 32.9 10*3/uL — ABNORMAL HIGH (ref 4.0–10.5)

## 2017-07-09 LAB — URINALYSIS, COMPLETE (UACMP) WITH MICROSCOPIC
BILIRUBIN URINE: NEGATIVE
Glucose, UA: >=500 mg/dL — AB
KETONES UR: 80 mg/dL — AB
Leukocytes, UA: NEGATIVE
Nitrite: NEGATIVE
PH: 5 (ref 5.0–8.0)
PROTEIN: 30 mg/dL — AB
Specific Gravity, Urine: 1.02 (ref 1.005–1.030)

## 2017-07-09 LAB — BLOOD GAS, VENOUS
ACID-BASE DEFICIT: 27.3 mmol/L — AB (ref 0.0–2.0)
Acid-base deficit: 14.7 mmol/L — ABNORMAL HIGH (ref 0.0–2.0)
BICARBONATE: 4.5 mmol/L — AB (ref 20.0–28.0)
Bicarbonate: 13.6 mmol/L — ABNORMAL LOW (ref 20.0–28.0)
FIO2: 21
O2 SAT: 40.1 %
O2 Saturation: 82.5 %
PCO2 VEN: 41.4 mmHg — AB (ref 44.0–60.0)
PH VEN: 7.142 — AB (ref 7.250–7.430)
Patient temperature: 98.6
Patient temperature: 98.6
pCO2, Ven: 20.7 mmHg — ABNORMAL LOW (ref 44.0–60.0)
pH, Ven: 6.969 — CL (ref 7.250–7.430)
pO2, Ven: 62.5 mmHg — ABNORMAL HIGH (ref 32.0–45.0)

## 2017-07-09 LAB — BASIC METABOLIC PANEL
ANION GAP: 16 — AB (ref 5–15)
BUN: 42 mg/dL — AB (ref 6–20)
BUN: 38 mg/dL — ABNORMAL HIGH (ref 6–20)
BUN: 30 mg/dL — AB (ref 6–20)
CALCIUM: 8 mg/dL — AB (ref 8.9–10.3)
CHLORIDE: 96 mmol/L — AB (ref 101–111)
CO2: <7 mmol/L — ABNORMAL LOW (ref 22–32)
CO2: <7 mmol/L — ABNORMAL LOW (ref 22–32)
CO2: 12 mmol/L — ABNORMAL LOW (ref 22–32)
CREATININE: 2.22 mg/dL — AB (ref 0.61–1.24)
Calcium: 8.2 mg/dL — ABNORMAL LOW (ref 8.9–10.3)
Calcium: 7.8 mg/dL — ABNORMAL LOW (ref 8.9–10.3)
Chloride: 107 mmol/L (ref 101–111)
Chloride: 115 mmol/L — ABNORMAL HIGH (ref 101–111)
Creatinine, Ser: 2.63 mg/dL — ABNORMAL HIGH (ref 0.61–1.24)
Creatinine, Ser: 1.68 mg/dL — ABNORMAL HIGH (ref 0.61–1.24)
GFR calc Af Amer: 39 mL/min — ABNORMAL LOW (ref >60)
GFR calc non Af Amer: 33 mL/min — ABNORMAL LOW (ref >60)
GFR calc non Af Amer: 41 mL/min — ABNORMAL LOW (ref >60)
GFR, EST AFRICAN AMERICAN: 47 mL/min — AB (ref >60)
GFR, EST NON AFRICAN AMERICAN: 57 mL/min — AB (ref >60)
GLUCOSE: 975 mg/dL — AB (ref 65–99)
Glucose, Bld: 698 mg/dL (ref 65–99)
Glucose, Bld: 392 mg/dL — ABNORMAL HIGH (ref 65–99)
POTASSIUM: 4 mmol/L (ref 3.5–5.1)
Potassium: 4.9 mmol/L (ref 3.5–5.1)
SODIUM: 131 mmol/L — AB (ref 135–145)
SODIUM: 138 mmol/L (ref 135–145)
SODIUM: 143 mmol/L (ref 135–145)

## 2017-07-09 LAB — LACTIC ACID, PLASMA
LACTIC ACID, VENOUS: 4.2 mmol/L — AB (ref 0.5–1.9)
LACTIC ACID, VENOUS: 3.9 mmol/L — AB (ref 0.5–1.9)

## 2017-07-09 LAB — NA AND K (SODIUM & POTASSIUM), RAND UR
POTASSIUM UR: 28 mmol/L
SODIUM UR: 39 mmol/L

## 2017-07-09 LAB — PROCALCITONIN: Procalcitonin: 27.04 ng/mL

## 2017-07-09 LAB — CBG MONITORING, ED: Glucose-Capillary: >600 mg/dL (ref 65–99)

## 2017-07-09 LAB — CBC
HEMATOCRIT: 41 % (ref 39.0–52.0)
Hemoglobin: 13.3 g/dL (ref 13.0–17.0)
MCH: 28.3 pg (ref 26.0–34.0)
MCHC: 32.4 g/dL (ref 30.0–36.0)
MCV: 87.2 fL (ref 78.0–100.0)
Platelets: 413 10*3/uL — ABNORMAL HIGH (ref 150–400)
RBC: 4.7 MIL/uL (ref 4.22–5.81)
RDW: 13.8 % (ref 11.5–15.5)
WBC: 37.8 10*3/uL — AB (ref 4.0–10.5)

## 2017-07-09 LAB — MRSA PCR SCREENING: MRSA BY PCR: NEGATIVE

## 2017-07-09 LAB — BETA-HYDROXYBUTYRIC ACID: Beta-Hydroxybutyric Acid: >8 mmol/L — ABNORMAL HIGH (ref 0.05–0.27)

## 2017-07-09 LAB — MAGNESIUM: Magnesium: 1.7 mg/dL (ref 1.7–2.4)

## 2017-07-09 MED ORDER — SODIUM CHLORIDE 0.9 % IV SOLN
1.0000 g | Freq: Once | INTRAVENOUS | Status: AC
  Administered 2017-07-09: 1 g via INTRAVENOUS
  Filled 2017-07-09: qty 10

## 2017-07-09 MED ORDER — DEXTROSE-NACL 5-0.45 % IV SOLN: INTRAVENOUS | Status: DC

## 2017-07-09 MED ORDER — ENOXAPARIN SODIUM 30 MG/0.3ML ~~LOC~~ SOLN
30.0000 mg | SUBCUTANEOUS | Status: DC
  Administered 2017-07-09: 30 mg via SUBCUTANEOUS
  Filled 2017-07-09: qty 0.3

## 2017-07-09 MED ORDER — POTASSIUM CHLORIDE 10 MEQ/100ML IV SOLN
10.0000 meq | INTRAVENOUS | Status: AC
  Administered 2017-07-09: 10 meq via INTRAVENOUS
  Filled 2017-07-09: qty 100

## 2017-07-09 MED ORDER — SODIUM CHLORIDE 0.9 % IV BOLUS (SEPSIS)
1000.0000 mL | Freq: Once | INTRAVENOUS | Status: AC
  Administered 2017-07-09: 1000 mL via INTRAVENOUS

## 2017-07-09 MED ORDER — SODIUM CHLORIDE 0.9 % IV SOLN: INTRAVENOUS | Status: DC

## 2017-07-09 MED ORDER — LACTATED RINGERS IV BOLUS (SEPSIS)
1000.0000 mL | Freq: Once | INTRAVENOUS | Status: AC
  Administered 2017-07-09: 1000 mL via INTRAVENOUS

## 2017-07-09 MED ORDER — PANTOPRAZOLE SODIUM 40 MG IV SOLR
40.0000 mg | INTRAVENOUS | Status: DC
  Administered 2017-07-09: 40 mg via INTRAVENOUS
  Filled 2017-07-09: qty 40

## 2017-07-09 MED ORDER — DEXTROSE-NACL 5-0.45 % IV SOLN
INTRAVENOUS | Status: DC
  Administered 2017-07-09 – 2017-07-10 (×4): via INTRAVENOUS

## 2017-07-09 MED ORDER — POTASSIUM CHLORIDE 10 MEQ/100ML IV SOLN: 10.0000 meq | INTRAVENOUS | Status: DC

## 2017-07-09 MED ORDER — SODIUM CHLORIDE 0.9 % IV SOLN
INTRAVENOUS | Status: DC
  Administered 2017-07-09: 10.3 [IU]/h via INTRAVENOUS
  Filled 2017-07-09 (×4): qty 1

## 2017-07-09 MED ORDER — NYSTATIN 100000 UNIT/GM EX POWD
Freq: Three times a day (TID) | CUTANEOUS | Status: DC
  Administered 2017-07-09 – 2017-07-10 (×2): via TOPICAL
  Filled 2017-07-09: qty 15

## 2017-07-09 MED ORDER — SODIUM CHLORIDE 0.9 % IV SOLN
INTRAVENOUS | Status: AC
  Administered 2017-07-09: 18:00:00 via INTRAVENOUS

## 2017-07-09 MED ORDER — SODIUM CHLORIDE 0.9 % IV SOLN
INTRAVENOUS | Status: DC
  Administered 2017-07-09: 5.4 [IU]/h via INTRAVENOUS
  Filled 2017-07-09: qty 1

## 2017-07-09 MED ORDER — SODIUM CHLORIDE 0.9 % IV BOLUS (SEPSIS)
1000.0000 mL | Freq: Once | INTRAVENOUS | Status: AC
Start: 2017-07-09 — End: 2017-07-09
  Administered 2017-07-09: 1000 mL via INTRAVENOUS

## 2017-07-09 MED ORDER — ALBUTEROL SULFATE (2.5 MG/3ML) 0.083% IN NEBU
10.0000 mg | INHALATION_SOLUTION | Freq: Once | RESPIRATORY_TRACT | Status: AC
  Administered 2017-07-09: 10 mg via RESPIRATORY_TRACT
  Filled 2017-07-09: qty 12

## 2017-07-09 MED ORDER — SODIUM CHLORIDE 0.9 % IV SOLN
INTRAVENOUS | Status: DC
  Administered 2017-07-09 (×2): via INTRAVENOUS

## 2017-07-09 NOTE — Progress Notes (Signed)
Notified Dr. Nicholos Johnsamachandran that patient is sinus tachycardiac at 150-160 no new orders at this time. Continue with fluids.

## 2017-07-09 NOTE — ED Notes (Signed)
Respiratory at bedside.

## 2017-07-09 NOTE — ED Provider Notes (Signed)
[facility] DEPT Provider Note   CSN: [id number] Arrival date & time: 07/09/17  1426     History   Chief Complaint Chief Complaint  Patient presents with  . Hyperglycemia    HPI Dylan Butler is a 21 y.o. male with PMH/o DM who presents with hyperglycemia. Mom reports the patient wears an insulin pump for management of his diabetes. She states that the battery died yesterday and he did not tell mom. Mom told EMS that insulin pump battery died last night so he did not receive any insulin. She states that this morning patient started complaining of some abdominal pain and started vomiting. Mom to take patient to endocrinologist to prompted ED visit for further evaluation. Mom states that this is similar to when he has had DKA in the past, most recently July 2018. Mom reports the patient was at his baseline last night. Mom denies any recent fever, chills.   The history is provided by a relative. The history is limited by the condition of the patient.    Past Medical History:  Diagnosis Date  . ADHD (attention deficit hyperactivity disorder)   . Anxiety   . Bipolar 1 disorder ([place])   . Diabetes mellitus without complication ([place])   . 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 ODD (oppositional defiant disorder)   . Schizophrenia [facility])     Patient Active Problem List   Diagnosis Date Noted  . Fall   . Dental abscess   . Hyperkalemia 05/28/2017  . High anion gap metabolic acidosis 74/14/2395  . AKI (acute kidney injury) ([place]) 05/28/2017  . Major depressive disorder, recurrent episode, severe, with psychosis ([place]) 01/30/2017  . DKA (diabetic ketoacidoses) ([place]) 11/26/2016  . Tachycardia 07/31/2016  . Ketoacidosis 07/31/2016  . Controlled type 1 diabetes mellitus with hyperglycemia ([place]) 07/31/2016  . Nausea with vomiting 07/31/2016  . DKA, type 1 ([place]) 07/31/2016    Past Surgical History:  Procedure Laterality Date  . HERNIA REPAIR          Home Medications    Prior to Admission medications   Medication Sig Start Date End Date Taking? Authorizing Provider  acetaminophen (TYLENOL) 500 MG tablet Take 500 mg by mouth every 6 (six) hours as needed for headache.    [provider]  amantadine (SYMMETREL) 100 MG capsule Take 1 capsule (100 mg total) by mouth at bedtime. Patient not taking: Reported on 05/28/2017 02/01/17   Patrecia Pour, NP  amoxicillin-clavulanate (AUGMENTIN) 875-125 MG tablet Take 1 tablet by mouth every 12 (twelve) hours. Patient not taking: Reported on 07/09/2017 05/30/17   Patrecia Pour, MD  atomoxetine (STRATTERA) 60 MG capsule Take 1 capsule (60 mg total) by mouth daily. Patient not taking: Reported on 05/28/2017 02/01/17   Patrecia Pour, NP  atomoxetine (STRATTERA) 80 MG capsule Take 80 mg by mouth daily.    [provider]  blood glucose meter kit and supplies KIT Dispense based on patient and insurance preference. Use up to four times daily as directed. (FOR ICD-9 250.00, 250.01). 08/03/16   Theodis Blaze, MD  cetirizine (ZYRTEC) 10 MG tablet Take 1 tablet (10 mg total) by mouth daily. Patient not taking: Reported on 05/28/2017 11/27/16   Murlean Iba, MD  cloNIDine (CATAPRES) 0.2 MG tablet Take 0.2 mg by mouth daily.    [provider]  divalproex (DEPAKOTE ER) 250 MG 24 hr tablet Take 500 mg by mouth daily.    [provider]  fluticasone (FLONASE) 50 MCG/ACT nasal spray Place 2 sprays into both nostrils daily.    [provider]  glucose blood (GLUCOSE METER TEST) test strip Use as instructed for 4 times daily testing of blood glucose. 12/14/16   Tresa Garter, MD  guanFACINE (TENEX) 2 MG tablet Take 2 tablets (4 mg total) by mouth at bedtime. Patient not taking: Reported on 05/28/2017 02/01/17   Patrecia Pour, NP  Insulin Glargine (LANTUS SOLOSTAR) 100 UNIT/ML Solostar Pen Inject 40 Units into the skin daily. Patient not taking: Reported on  05/28/2017 11/27/16   Irwin Brakeman L, MD  insulin lispro (HUMALOG KWIKPEN) 100 UNIT/ML KiwkPen 15-25 Units Subcutaneous 3 times daily before meals as directed Patient taking differently: Inject 8-12 Units into the skin 3 (three) times daily. Sliding scale 11/27/16   Irwin Brakeman L, MD  Insulin Pen Needle 31G X 6 MM MISC 1 Device by Does not apply route daily at 8 pm. 11/29/16   Emokpae, Ejiroghene E, MD  ondansetron (ZOFRAN) 4 MG tablet Take 1 tablet (4 mg total) by mouth every 8 (eight) hours as needed for nausea or vomiting. Patient not taking: Reported on 05/28/2017 01/14/17   Rolland Porter, MD  traZODone (DESYREL) 150 MG tablet Take 1.5 tablets (225 mg total) by mouth at bedtime. Patient not taking: Reported on 05/28/2017 02/01/17   Patrecia Pour, NP  ziprasidone (GEODON) 60 MG capsule Take 2 capsules (120 mg total) by mouth at bedtime. Patient not taking: Reported on 05/28/2017 02/01/17   Patrecia Pour, NP  ziprasidone (GEODON) 80 MG capsule Take 160 mg by mouth at bedtime.     [provider]    Family History Family History  Problem Relation Age of Onset  . Diabetes Mellitus II Maternal Grandmother   . CAD Maternal Grandmother   . Seizures Mother     Social History Social History  Substance Use Topics  . Smoking status: Never Smoker  . Smokeless tobacco: Never Used  . Alcohol use No     Allergies   Other   Review of Systems Review of Systems  Unable to perform ROS: Acuity of condition     Physical Exam Updated Vital Signs BP (!) 89/74   Pulse (!) 167   Temp 99 F (37.2 C) (Oral)   Resp (!) 41   SpO2 100%   Physical Exam  Constitutional: He appears well-developed and well-nourished. He appears lethargic. He appears ill.  Appears uncomfortable, slightly lethargic but is responsive to verbal stimuli  HENT:  Head: Normocephalic and atraumatic.  Mouth/Throat: Oropharynx is clear and moist. Mucous membranes are dry.  Eyes: Pupils are equal, round, and  reactive to light. Conjunctivae, EOM and lids are normal.  Neck: Full passive range of motion without pain.  Cardiovascular: Regular rhythm, normal heart sounds and normal pulses.  Tachycardia present.  Exam reveals no gallop and no friction rub.   No murmur heard. Pulmonary/Chest: Effort normal and breath sounds normal. Tachypnea noted. No respiratory distress. He has no decreased breath sounds. He has no rales.  Abdominal: Soft. Normal appearance. There is generalized tenderness. There is guarding (Voluntary). There is no rigidity.  Musculoskeletal: Normal range of motion.  Neurological: He appears lethargic. GCS eye subscore is 4. GCS verbal subscore is 4. GCS motor subscore is 6.  Responds to verbal stimuli Follows commands Moves all extremities spontaneously  Skin: Skin is warm and dry. Capillary refill takes less than 2 seconds.  Psychiatric: He has a  normal mood and affect.  Nursing note and vitals reviewed.    ED Treatments / Results  Labs (all labs ordered are listed, but only abnormal results are displayed) Labs Reviewed  BASIC METABOLIC PANEL - Abnormal; Notable for the following:       Result Value   Sodium 131 (*)    Potassium >7.5 (*)    Chloride 96 (*)    CO2 <7 (*)    Glucose, Bld 975 (*)    BUN 42 (*)    Creatinine, Ser 2.63 (*)    Calcium 8.2 (*)    GFR calc non Af Amer 33 (*)    GFR calc Af Amer 39 (*)    All other components within normal limits  CBC - Abnormal; Notable for the following:    WBC 37.8 (*)    Platelets 413 (*)    All other components within normal limits  BLOOD GAS, VENOUS - Abnormal; Notable for the following:    pH, Ven 6.969 (*)    pCO2, Ven 20.7 (*)    pO2, Ven 62.5 (*)    Bicarbonate 4.5 (*)    Acid-base deficit 27.3 (*)    All other components within normal limits  CBG MONITORING, ED - Abnormal; Notable for the following:    Glucose-Capillary >600 (*)    All other components within normal limits  I-STAT CHEM 8, ED - Abnormal;  Notable for the following:    Sodium 129 (*)    Potassium 7.4 (*)    BUN 52 (*)    Creatinine, Ser 1.90 (*)    Glucose, Bld >700 (*)    Calcium, Ion 1.03 (*)    TCO2 8 (*)    All other components within normal limits  URINALYSIS, ROUTINE W REFLEX MICROSCOPIC  NA AND K (SODIUM & POTASSIUM), RAND UR  BASIC METABOLIC PANEL  BASIC METABOLIC PANEL  BASIC METABOLIC PANEL  LACTIC ACID, PLASMA  LACTIC ACID, PLASMA  BETA-HYDROXYBUTYRIC ACID  CBC WITH DIFFERENTIAL/PLATELET  URINALYSIS, COMPLETE (UACMP) WITH MICROSCOPIC  I-STAT CHEM 8, ED    EKG  EKG Interpretation  Date/Time:  Tuesday July 09 2017 15:05:14 EDT Ventricular Rate:  176 PR Interval:    QRS Duration: 104 QT Interval:  323 QTC Calculation: 553 R Axis:   143 Text Interpretation:  Sinus tachycardia LAE, consider biatrial enlargement RVH with secondary repolarization abnrm Borderline ST depression, lateral leads Borderline ST elevation, inferior leads Prolonged QT interval peaked t waves, qrs widened from prior Confirmed by Deno Etienne [phone]) on 07/09/2017 3:48:00 PM       Radiology Dg Chest Port 1 View  Result Date: 07/09/2017 CLINICAL DATA:  Ketoacidosis, type 1 diabetes, nonsmoker. EXAM: PORTABLE CHEST 1 VIEW COMPARISON:  Portable chest x-ray of May 28, 2017 FINDINGS: The lungs are adequately inflated. There is no focal infiltrate. The heart is top-normal in size. The pulmonary vascularity is normal. The mediastinum is normal in width. The trachea is midline. The bony thorax is unremarkable. IMPRESSION: There is no active cardiopulmonary disease. Electronically Signed   By: David  Martinique M.D.   On: 07/09/2017 15:41    Procedures Procedures (including critical care time)  CRITICAL CARE Performed by: Volanda Napoleon   Total critical care time: 30 minutes  Critical care time was exclusive of separately billable procedures and treating other patients.  Critical care was necessary to treat or prevent imminent or  life-threatening deterioration.  Critical care was time spent personally by me on the following activities: development of  treatment plan with patient and/or surrogate as well as nursing, discussions with consultants, evaluation of patient's response to treatment, examination of patient, obtaining history from patient or surrogate, ordering and performing treatments and interventions, ordering and review of laboratory studies, ordering and review of radiographic studies, pulse oximetry and re-evaluation of patient's condition.   Medications Ordered in ED Medications  insulin regular (NOVOLIN R,HUMULIN R) 100 Units in sodium chloride 0.9 % 100 mL (1 Units/mL) infusion (5.4 Units/hr Intravenous New Bag/Given 07/09/17 1533)  sodium chloride 0.9 % bolus 1,000 mL (1,000 mLs Intravenous New Bag/Given 07/09/17 1524)    And  0.9 %  sodium chloride infusion (not administered)  dextrose 5 %-0.45 % sodium chloride infusion (not administered)  lactated ringers bolus 1,000 mL (not administered)  0.9 %  sodium chloride infusion (not administered)  0.9 %  sodium chloride infusion (not administered)  dextrose 5 %-0.45 % sodium chloride infusion (not administered)  insulin regular (NOVOLIN R,HUMULIN R) 100 Units in sodium chloride 0.9 % 100 mL (1 Units/mL) infusion (not administered)  dextrose 5 %-0.45 % sodium chloride infusion (not administered)  potassium chloride 10 mEq in 100 mL IVPB (not administered)  enoxaparin (LOVENOX) injection 30 mg (not administered)  potassium chloride 10 mEq in 100 mL IVPB (not administered)  calcium gluconate 1 g in sodium chloride 0.9 % 100 mL IVPB (0 g Intravenous Stopped 07/09/17 1620)  albuterol (PROVENTIL) (2.5 MG/3ML) 0.083% nebulizer solution 10 mg (10 mg Nebulization Given 07/09/17 1548)     Initial Impression / Assessment and Plan / ED Course  I have reviewed the triage vital signs and the nursing notes.  Pertinent labs & imaging results that were available during my  care of the patient were reviewed by me and considered in my medical decision making (see chart for details).     21 y.o. M who presents with hyperglycemia. On initial ED arrival patient was tachycardic, Tachypnea, hypotensive. Initial CBG showed BG >600. Patient is responsive to verbal stimuli. Suspect DKA given history/physical exam. Likely secondary to the fact the patient did not receive insulin yesterday. Patient has not had any recent sicknesses. Initial labs ordered at triage.  Labs and imaging reviewed. Chest x-ray is negative for any acute infectious etiology. I-STAT Chem-8 shows potassium of 7.4, creatinine 1.90. CMP shows blood glucose is 975. CBC shows white blood cell count of 37.8. Some labs, patient has an anion gap of 20. VBG shows pH 6.9, PCO2 20.7, PO2 62.5. Patient likely in DKA secondary to lack of insulin. DKA protocol initiated and insulin drip started. Plan to give patient bicarbonate, calcium gluconate, albuterol for treatment of hyperkalemia. Will likely require ICU admission for further treatment. Discussed with mom. She is agreeable to plan. Will contact critical care medicine for admission.  Discussed with Dr. Ashby Dawes [facility]). We will plan to admit patient for treatment of DKA.  Final Clinical Impressions(s) / ED Diagnoses   Final diagnoses:  Diabetic ketoacidosis without coma associated with type 2 diabetes mellitus [facility])    New Prescriptions New Prescriptions   No medications on file     Volanda Napoleon, PA-C 07/09/17 1820    Volanda Napoleon, PA-C 07/09/17 [place], [place], DO 07/09/17 2314

## 2017-07-09 NOTE — ED Notes (Signed)
PA made aware of patient's status.

## 2017-07-09 NOTE — ED Triage Notes (Signed)
Per EMS-states patient did not tell mother the battery in insulin pump died yesterday-CBG reading high-possible DKA-pump is now working and mother has given son [name] insulin

## 2017-07-09 NOTE — Progress Notes (Addendum)
Elink notified of critical VBG pH value of 7.14

## 2017-07-09 NOTE — ED Notes (Signed)
Bed: [facility] Expected date:  Expected time:  Means of arrival:  Comments: 

## 2017-07-09 NOTE — H&P (Signed)
PULMONARY / CRITICAL CARE MEDICINE   Name: Dylan Butler MRN: [id number] DOB: 19-Mar-1996    ADMISSION DATE:  07/09/2017 CONSULTATION DATE:  07/09/2017  REFERRING MD:  Carlota Raspberry, PA  CHIEF COMPLAINT:  Hyperglycemia  HISTORY OF PRESENT ILLNESS:   21 year old male with PMH of IDDM on insulin pump, ADHD, Bipolar, schizophrenia, multiple personality disorder who presented to the ER on 9/4 with hyperglycemia.  Patient recently admitted 7/23 to 7/26 for DKA secondary to a dental abscess.  Per patient's mom, the battery on his insulin pump died yesterday and patient did not tell his mother.  This morning, patient complained of abdominal pain and started vomiting.  Denied any recent fever, chills, or sickness.  Patient was at his baseline mental status yesterday.  Arrived to the ER lethargic, afebrile, tachycardic, tachypneic, and hypotensive with glucose of 975 on labs.  Other values noted for Na 131, K > 7.5,  sCr 2.63, BUN 42, CO2 < 7, WBC 37.8.  CXR negative.  UA negative. Hyperkalemia treated in addition to IV fluids and insulin drip.  PCCM to admit to ICU for severe DKA.   At present he is complaining of itching/burning in his inguinal area and asking for a fungal cream.  He also is very thirsty and wants to drink something other than water or at least sip water instead of using oral swabs.  His mother feels he looks better than upon arrival and that he was not as bad as when presenting in July.  PAST MEDICAL HISTORY :  He  has a past medical history of ADHD (attention deficit hyperactivity disorder); Anxiety; Bipolar 1 disorder ([place]); Diabetes mellitus without complication ([place]); Multiple personality disorder; ODD (oppositional defiant disorder); and Schizophrenia ([place]).  PAST SURGICAL HISTORY: He  has a past surgical history that includes Hernia repair.  Allergies  Allergen Reactions  . Other Shortness Of Breath and Nausea And Vomiting    Honey Mustard    No current facility-administered  medications on file prior to encounter.    Current Outpatient Prescriptions on File Prior to Encounter  Medication Sig  . acetaminophen (TYLENOL) 500 MG tablet Take 500 mg by mouth every 6 (six) hours as needed for headache.  Marland Kitchen atomoxetine (STRATTERA) 80 MG capsule Take 80 mg by mouth daily.  . cloNIDine (CATAPRES) 0.2 MG tablet Take 0.2 mg by mouth daily.  . divalproex (DEPAKOTE ER) 250 MG 24 hr tablet Take 500 mg by mouth daily.  . fluticasone (FLONASE) 50 MCG/ACT nasal spray Place 2 sprays into both nostrils daily.  . insulin lispro (HUMALOG KWIKPEN) 100 UNIT/ML KiwkPen 15-25 Units Subcutaneous 3 times daily before meals as directed (Patient taking differently: Inject 8-12 Units into the skin 3 (three) times daily. Sliding scale)  . ziprasidone (GEODON) 80 MG capsule Take 160 mg by mouth at bedtime.   Marland Kitchen amantadine (SYMMETREL) 100 MG capsule Take 1 capsule (100 mg total) by mouth at bedtime. (Patient not taking: Reported on 05/28/2017)  . amoxicillin-clavulanate (AUGMENTIN) 875-125 MG tablet Take 1 tablet by mouth every 12 (twelve) hours. (Patient not taking: Reported on 07/09/2017)  . atomoxetine (STRATTERA) 60 MG capsule Take 1 capsule (60 mg total) by mouth daily. (Patient not taking: Reported on 05/28/2017)  . blood glucose meter kit and supplies KIT Dispense based on patient and insurance preference. Use up to four times daily as directed. (FOR ICD-9 250.00, 250.01).  . cetirizine (ZYRTEC) 10 MG tablet Take 1 tablet (10 mg total) by mouth daily. (Patient not taking: Reported  on 05/28/2017)  . glucose blood (GLUCOSE METER TEST) test strip Use as instructed for 4 times daily testing of blood glucose.  Marland Kitchen guanFACINE (TENEX) 2 MG tablet Take 2 tablets (4 mg total) by mouth at bedtime. (Patient not taking: Reported on 05/28/2017)  . Insulin Glargine (LANTUS SOLOSTAR) 100 UNIT/ML Solostar Pen Inject 40 Units into the skin daily. (Patient not taking: Reported on 05/28/2017)  . Insulin Pen Needle 31G X 6  MM MISC 1 Device by Does not apply route daily at 8 pm.  . ondansetron (ZOFRAN) 4 MG tablet Take 1 tablet (4 mg total) by mouth every 8 (eight) hours as needed for nausea or vomiting. (Patient not taking: Reported on 05/28/2017)  . traZODone (DESYREL) 150 MG tablet Take 1.5 tablets (225 mg total) by mouth at bedtime. (Patient not taking: Reported on 05/28/2017)  . ziprasidone (GEODON) 60 MG capsule Take 2 capsules (120 mg total) by mouth at bedtime. (Patient not taking: Reported on 05/28/2017)    FAMILY HISTORY:  His indicated that his mother is alive. He indicated that the status of his maternal grandmother is unknown.    SOCIAL HISTORY: He  reports that he has never smoked. He has never used smokeless tobacco. He reports that he does not drink alcohol or use drugs.  REVIEW OF SYSTEMS:   Denies f/c/n/v/d/cp/sob All other systems reviewed and negative  SUBJECTIVE:  thirsty  VITAL SIGNS: BP (!) 109/46   Pulse (!) 151   Temp 98.6 F (37 C) (Oral)   Resp (!) 22   Ht 5' 4"  (1.626 m)   Wt 132 lb 0.9 oz (59.9 kg)   SpO2 99%   BMI 22.67 kg/m   HEMODYNAMICS:    VENTILATOR SETTINGS:    INTAKE / OUTPUT: I/O last 3 completed shifts: In: 1734.9 [I.V.:734.9; IV Piggyback:1000] Out: -   PHYSICAL EXAMINATION: General:  NAD, sitting up in bed sipping water Neuro:  Caox3, mae, non-focal HEENT:  Atnc, eomi, perrla, momm, pink conjunctiva Cardiovascular:  tacvhy regular no murmur, no cce Lungs:  Tachy, cta, good Vt, no acc muscle use Abdomen:  Distended +BS, soft some guardning not rigid Musculoskeletal:  No red warm swollen tender deformed joints Skin:  Warm dry right inguinal area erythema  LABS:  BMET  Recent Labs Lab 07/09/17 1439 07/09/17 1518 07/09/17 1751  NA 131* 129* 138  K >7.5* 7.4* 4.9  CL 96* 102 107  CO2 <7*  --  <7*  BUN 42* 52* 38*  CREATININE 2.63* 1.90* 2.22*  GLUCOSE 975* >700* 698*    Electrolytes  Recent Labs Lab 07/09/17 1439 07/09/17 1751   CALCIUM 8.2* 8.0*    CBC  Recent Labs Lab 07/09/17 1439 07/09/17 1518 07/09/17 1751  WBC 37.8*  --  32.9*  HGB 13.3 15.6 12.9*  HCT 41.0 46.0 38.1*  PLT 413*  --  322    Coag's No results for input(s): APTT, INR in the last 168 hours.  Sepsis Markers  Recent Labs Lab 07/09/17 1751  LATICACIDVEN 4.2*    ABG No results for input(s): PHART, PCO2ART, PO2ART in the last 168 hours.  Liver Enzymes No results for input(s): AST, ALT, ALKPHOS, BILITOT, ALBUMIN in the last 168 hours.  Cardiac Enzymes No results for input(s): TROPONINI, PROBNP in the last 168 hours.  Glucose  Recent Labs Lab 07/09/17 1452 07/09/17 1529 07/09/17 1646 07/09/17 1810  GLUCAP >600* >600* >600* >600*    Imaging Dg Chest Port 1 View  Result Date: 07/09/2017 CLINICAL DATA:  Ketoacidosis, type 1 diabetes, nonsmoker. EXAM: PORTABLE CHEST 1 VIEW COMPARISON:  Portable chest x-ray of May 28, 2017 FINDINGS: The lungs are adequately inflated. There is no focal infiltrate. The heart is top-normal in size. The pulmonary vascularity is normal. The mediastinum is normal in width. The trachea is midline. The bony thorax is unremarkable. IMPRESSION: There is no active cardiopulmonary disease. Electronically Signed   By: David  Martinique M.D.   On: 07/09/2017 15:41    STUDIES:  9/4 CXR >> negative for acute process 9/4 EKG >> ST 174, peaked [place], QRS 0.104, QTc 0.553  CULTURES: 9/4 MRSA PCR >> neg  ANTIBIOTICS: none  SIGNIFICANT EVENTS: 9/4 >> Admit  LINES/TUBES: PIV x 2   DISCUSSION: 76 yoM w/PMH of IDDM on insulin pump and psych issues who presented with glucose of 975 in severe DKA- most likely to no insulin x 1 day due to dead batteries in insulin pump.  No obvious infectious source.   ASSESSMENT / PLAN:  PULMONARY A: No acute issues -CXR neg P:   Monitor  CARDIOVASCULAR A:  ST- likely to dehydration, improving Prolonged QTc  P:  Tele monitoring  Goal MAP > 65 Trend Lactic  acid x 3  RENAL A:   AKI- likely pre-renal  Hyperkalemia- secondary to acidosis, improved AGMA/lactic acidosis  Non-anion gap acidosis Hyponatremia 2/2 DKA, improved - s/p 2L IVF P:   Additional 1L NS now NS at 125 ml/hr BMET q 4 hours Trend BMP / urinary output Replace electrolytes as indicated  GASTROINTESTINAL A:   ABd pain 2/2 dka P:   Ad lib water till AG closes, then advance diet as tolerated Protonix for SUP  HEMATOLOGIC A:   Leukocytosis- likely reactive P:  Trend CBC Lovenox for VTE ppx   INFECTIOUS A:   Possible inguinal fungal infectin Leukocytosis- No clear source of infection P:   Nystatin powder Monitor fever curve/ WBC trend Check PCT for completeness   ENDOCRINE A:   Severe DKA - likely due to insulin pump being off at home IDDM on home insulin pump P:   Continued DKA protocol w/insulin gtt Hourly CBGs Change IVF to add D5 when glucose <250 No insulin pump for now  NEUROLOGIC A:   Metabolic encephalopathy, improved Hx multiple psych issues P:   Monitor Holding preadmission geodon, trazodone, Strattera, clonidine, depakote ER,    FAMILY  - Updates: mother updated bedside  - Inter-disciplinary family meet or Palliative Care meeting due by:  07/16/17.  Upon my evaluation, this patient had a high probability of imminent or life-threatening deterioration due to metabolic acidosis, renal failure, extreme tachycardia  high complexity decision making to assess, manipulate, and support vital organ system failure including insulin infusion, frequent electrolyte monitoring  I have personally provided 30 minutes of critical care time exclusive of time spent on separately billable procedures and education. Time includes review and summation of previous medical record, laboratory data, radiology results, coordination of care with RN, and monitoring for potential decompensation. Interventions were performed as documented above.  Condition:  critical Prognosis: fair Code Status: full  Charlesetta Garibaldi, MD  Pulmonary and [place] Pager: [phone]  07/09/2017, 7:13 PM

## 2017-07-09 NOTE — Progress Notes (Signed)
CRITICAL VALUE ALERT  Critical Value:  Lactic Acid  Date & Time Notied:  07/09/17  Provider Notified Leta JunglingMarcia RN will relay to Dr. Nicholos Johnsamachandran  Orders Received/Actions taken:

## 2017-07-09 NOTE — Progress Notes (Signed)
eLink Physician-Brief Progress Note Patient Name: Alfredia FergusonKaleb Gratz DOB: 11/24/1995 MRN: [id number]   Date of Service  07/09/2017  HPI/Events of Note  Severe dka bmet not entered  Add bmet q4h  eICU Interventions       Intervention Category Minor Interventions: Electrolytes abnormality - evaluation and management  FEINSTEIN,DANIEL J. 07/09/2017, 11:17 PM

## 2017-07-09 NOTE — ED Notes (Signed)
XR at bedside

## 2017-07-09 NOTE — ED Notes (Signed)
PA at bedside. Made aware of trending vital signs.

## 2017-07-10 ENCOUNTER — Ambulatory Visit: Payer: Self-pay | Admitting: Family Medicine

## 2017-07-10 DIAGNOSIS — E1010 Type 1 diabetes mellitus with ketoacidosis without coma: Secondary | ICD-10-CM

## 2017-07-10 LAB — GLUCOSE, CAPILLARY
GLUCOSE-CAPILLARY: 101 mg/dL — AB (ref 65–99)
GLUCOSE-CAPILLARY: 127 mg/dL — AB (ref 65–99)
GLUCOSE-CAPILLARY: 173 mg/dL — AB (ref 65–99)
GLUCOSE-CAPILLARY: 200 mg/dL — AB (ref 65–99)
GLUCOSE-CAPILLARY: 211 mg/dL — AB (ref 65–99)
GLUCOSE-CAPILLARY: 57 mg/dL — AB (ref 65–99)
GLUCOSE-CAPILLARY: 80 mg/dL (ref 65–99)
Glucose-Capillary: 157 mg/dL — ABNORMAL HIGH (ref 65–99)
Glucose-Capillary: 99 mg/dL (ref 65–99)
Glucose-Capillary: 96 mg/dL (ref 65–99)
Glucose-Capillary: 154 mg/dL — ABNORMAL HIGH (ref 65–99)
Glucose-Capillary: 158 mg/dL — ABNORMAL HIGH (ref 65–99)
Glucose-Capillary: 183 mg/dL — ABNORMAL HIGH (ref 65–99)
Glucose-Capillary: 179 mg/dL — ABNORMAL HIGH (ref 65–99)
Glucose-Capillary: 108 mg/dL — ABNORMAL HIGH (ref 65–99)

## 2017-07-10 LAB — BASIC METABOLIC PANEL
ANION GAP: 5 (ref 5–15)
ANION GAP: 7 (ref 5–15)
BUN: 19 mg/dL (ref 6–20)
BUN: 15 mg/dL (ref 6–20)
CALCIUM: 7.7 mg/dL — AB (ref 8.9–10.3)
CALCIUM: 7.9 mg/dL — AB (ref 8.9–10.3)
CO2: 19 mmol/L — AB (ref 22–32)
CO2: 17 mmol/L — AB (ref 22–32)
CREATININE: 0.97 mg/dL (ref 0.61–1.24)
Chloride: 113 mmol/L — ABNORMAL HIGH (ref 101–111)
Chloride: 112 mmol/L — ABNORMAL HIGH (ref 101–111)
Creatinine, Ser: 0.78 mg/dL (ref 0.61–1.24)
Glucose, Bld: 155 mg/dL — ABNORMAL HIGH (ref 65–99)
Glucose, Bld: 103 mg/dL — ABNORMAL HIGH (ref 65–99)
Potassium: 3.5 mmol/L (ref 3.5–5.1)
Potassium: 3.7 mmol/L (ref 3.5–5.1)
SODIUM: 137 mmol/L (ref 135–145)
SODIUM: 136 mmol/L (ref 135–145)

## 2017-07-10 LAB — CBC WITH DIFFERENTIAL/PLATELET
BASOS ABS: 0 10*3/uL (ref 0.0–0.1)
Basophils Relative: 0 %
Eosinophils Absolute: 0 10*3/uL (ref 0.0–0.7)
Eosinophils Relative: 0 %
HEMATOCRIT: 32.3 % — AB (ref 39.0–52.0)
HEMOGLOBIN: 11 g/dL — AB (ref 13.0–17.0)
LYMPHS PCT: 7 %
Lymphs Abs: 1.6 10*3/uL (ref 0.7–4.0)
MCH: 27.8 pg (ref 26.0–34.0)
MCHC: 34.1 g/dL (ref 30.0–36.0)
MCV: 81.8 fL (ref 78.0–100.0)
Monocytes Absolute: 2 10*3/uL — ABNORMAL HIGH (ref 0.1–1.0)
Monocytes Relative: 9 %
NEUTROS ABS: 19 10*3/uL — AB (ref 1.7–7.7)
NEUTROS PCT: 84 %
Platelets: 263 10*3/uL (ref 150–400)
RBC: 3.95 MIL/uL — ABNORMAL LOW (ref 4.22–5.81)
RDW: 13.6 % (ref 11.5–15.5)
WBC: 22.6 10*3/uL — ABNORMAL HIGH (ref 4.0–10.5)

## 2017-07-10 LAB — PROCALCITONIN: Procalcitonin: 27.09 ng/mL

## 2017-07-10 LAB — PHOSPHORUS: PHOSPHORUS: 2 mg/dL — AB (ref 2.5–4.6)

## 2017-07-10 MED ORDER — ZIPRASIDONE HCL 80 MG PO CAPS
160.0000 mg | ORAL_CAPSULE | Freq: Every day | ORAL | Status: DC
  Administered 2017-07-10: 160 mg via ORAL
  Filled 2017-07-10: qty 2

## 2017-07-10 MED ORDER — INSULIN PUMP
Freq: Three times a day (TID) | SUBCUTANEOUS | Status: DC
  Administered 2017-07-10 – 2017-07-11 (×5): via SUBCUTANEOUS
  Filled 2017-07-10: qty 1

## 2017-07-10 MED ORDER — SODIUM CHLORIDE 0.9 % IV SOLN
INTRAVENOUS | Status: DC
  Administered 2017-07-10: 18:00:00 via INTRAVENOUS

## 2017-07-10 MED ORDER — DIVALPROEX SODIUM ER 500 MG PO TB24
500.0000 mg | ORAL_TABLET | Freq: Every day | ORAL | Status: DC
Start: 2017-07-10 — End: 2017-07-11
  Administered 2017-07-10 – 2017-07-11 (×2): 500 mg via ORAL
  Filled 2017-07-10: qty 2
  Filled 2017-07-10: qty 1

## 2017-07-10 MED ORDER — ENOXAPARIN SODIUM 40 MG/0.4ML ~~LOC~~ SOLN
40.0000 mg | SUBCUTANEOUS | Status: DC
  Administered 2017-07-10: 40 mg via SUBCUTANEOUS
  Filled 2017-07-10: qty 0.4

## 2017-07-10 MED ORDER — ZIPRASIDONE HCL 60 MG PO CAPS: 120.0000 mg | ORAL_CAPSULE | Freq: Every day | ORAL | Status: DC

## 2017-07-10 MED ORDER — INSULIN ASPART 100 UNIT/ML ~~LOC~~ SOLN: 0.0000 [IU] | Freq: Three times a day (TID) | SUBCUTANEOUS | Status: DC

## 2017-07-10 MED ORDER — INSULIN ASPART 100 UNIT/ML ~~LOC~~ SOLN: 0.0000 [IU] | SUBCUTANEOUS | Status: DC

## 2017-07-10 MED ORDER — SODIUM PHOSPHATES 45 MMOLE/15ML IV SOLN
30.0000 mmol | Freq: Once | INTRAVENOUS | Status: AC
  Administered 2017-07-10: 30 mmol via INTRAVENOUS
  Filled 2017-07-10: qty 10

## 2017-07-10 MED ORDER — INSULIN ASPART 100 UNIT/ML ~~LOC~~ SOLN: 0.0000 [IU] | Freq: Every day | SUBCUTANEOUS | Status: DC

## 2017-07-10 MED ORDER — ATOMOXETINE HCL 60 MG PO CAPS
60.0000 mg | ORAL_CAPSULE | Freq: Every day | ORAL | Status: DC
  Filled 2017-07-10: qty 1

## 2017-07-10 MED ORDER — PIPERACILLIN-TAZOBACTAM 3.375 G IVPB
3.3750 g | Freq: Three times a day (TID) | INTRAVENOUS | Status: DC
  Filled 2017-07-10: qty 50

## 2017-07-10 MED ORDER — PIPERACILLIN-TAZOBACTAM 3.375 G IVPB: 3.3750 g | Freq: Three times a day (TID) | INTRAVENOUS | Status: DC

## 2017-07-10 MED ORDER — AMANTADINE HCL 100 MG PO CAPS: 100.0000 mg | ORAL_CAPSULE | Freq: Every day | ORAL | Status: DC

## 2017-07-10 MED ORDER — PANTOPRAZOLE SODIUM 40 MG PO TBEC
40.0000 mg | DELAYED_RELEASE_TABLET | Freq: Every day | ORAL | Status: DC
Start: 2017-07-10 — End: 2017-07-11
  Administered 2017-07-10: 40 mg via ORAL
  Filled 2017-07-10: qty 1

## 2017-07-10 MED ORDER — PIPERACILLIN-TAZOBACTAM 3.375 G IVPB
3.3750 g | Freq: Three times a day (TID) | INTRAVENOUS | Status: DC
  Administered 2017-07-10 – 2017-07-11 (×4): 3.375 g via INTRAVENOUS
  Filled 2017-07-10 (×3): qty 50

## 2017-07-10 MED ORDER — CLONIDINE HCL 0.2 MG PO TABS
0.2000 mg | ORAL_TABLET | Freq: Every day | ORAL | Status: DC
Start: 2017-07-10 — End: 2017-07-11
  Administered 2017-07-10: 0.2 mg via ORAL
  Filled 2017-07-10: qty 1
  Filled 2017-07-10: qty 2

## 2017-07-10 MED ORDER — ATOMOXETINE HCL 40 MG PO CAPS
80.0000 mg | ORAL_CAPSULE | Freq: Every day | ORAL | Status: DC
  Administered 2017-07-10: 80 mg via ORAL
  Filled 2017-07-10: qty 2

## 2017-07-10 MED ORDER — VANCOMYCIN HCL IN DEXTROSE 750-5 MG/150ML-% IV SOLN
750.0000 mg | Freq: Three times a day (TID) | INTRAVENOUS | Status: DC
  Administered 2017-07-10 – 2017-07-11 (×4): 750 mg via INTRAVENOUS
  Filled 2017-07-10 (×6): qty 150

## 2017-07-10 NOTE — Progress Notes (Signed)
Patients mother signed consent for insulin pump. Pump started at 1150am. Insulin drio stopped at 1250pm per diabetic RN.

## 2017-07-10 NOTE — Progress Notes (Signed)
Inpatient Diabetes Program Recommendations  AACE/ADA: New Consensus Statement on Inpatient Glycemic Control (2015)  Target Ranges:  Prepandial:   less than 140 mg/dL      Peak postprandial:   less than 180 mg/dL (1-2 hours)      Critically ill patients:  140 - 180 mg/dL   Lab Results  Component Value Date   GLUCAP 154 (H) 07/10/2017   HGBA1C 9.2 (H) 12/06/2016    Review of Glycemic Control  Spoke with patient and Mom. Pt's Dad is bringing tubing for insulin pump to hospital. To restart this am. Insulin drip @ 2 units/hour. Will continue drip 1 hour after insulin pump is started. RN Air cabin crewgetting contract signed.   Will continue to follow.   Thank you. Ailene Ardshonda Erinne Gillentine, RD, LDN, CDE Inpatient Diabetes Coordinator [phone]

## 2017-07-10 NOTE — Progress Notes (Signed)
Transferred to room 1527 via wheelchair. Report given to RN.

## 2017-07-10 NOTE — Progress Notes (Signed)
Pharmacy Antibiotic Note  Dylan FergusonKaleb Butler is a 21 y.o. male admitted on 07/09/2017 with DKA.  Pharmacy has been consulted for vancomycin and zosyn dosing for sepsis. Wt 63.4 kg, WBC 37.8>22.6, Creat 1.9>>0.97, PCT 27.09, Lactate 3.9, temp 99> hypothermic. CXR neg for PNA, UA neg, etiology of sepsis is unclear.   Plan: Vancomycin 750 mg IV every 8 hours.  Goal trough 15-20 mcg/mL. Zosyn 3.375g IV q8h (4 hour infusion).  F/u renal function, WBC, temp, culture data Vancomycin levels as needed Change IV PPI to PO  Height: 5\' 2"  (157.5 cm) Weight: 139 lb 12.4 oz (63.4 kg) IBW/kg (Calculated) : 54.6  Temp (24hrs), Avg:98.3 F (36.8 C), Min:97.5 F (36.4 C), Max:99 F (37.2 C)   Recent Labs Lab 07/09/17 1439 07/09/17 1518 07/09/17 1751 07/09/17 2106 07/10/17 0313  WBC 37.8*  --  32.9*  --  22.6*  CREATININE 2.63* 1.90* 2.22* 1.68* 0.97  LATICACIDVEN  --   --  4.2* 3.9*  --     Estimated Creatinine Clearance: 93.8 mL/min (by C-G formula based on SCr of 0.97 mg/dL).    Allergies  Allergen Reactions  . Other Shortness Of Breath and Nausea And Vomiting    Honey Mustard    Antimicrobials this admission: vanc 9/5>> Zosyn 9/5>> Dose adjustments this admission:  Microbiology results: 9/4 MRSA PCR neg 9/6 BCx2>>  Thank you for allowing pharmacy to be a part of this patient's care.  Herby AbrahamMichelle T. Midori Dado, Pharm.D. [phone] 07/10/2017 8:12 AM

## 2017-07-10 NOTE — Progress Notes (Signed)
PULMONARY / CRITICAL CARE MEDICINE   Name: Dylan Butler MRN: [id number] DOB: 03/17/1996    ADMISSION DATE:  07/09/2017 CONSULTATION DATE:  07/09/2017  REFERRING MD:  Radene GunningL. 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  Patient recently admitted 7/23 to 7/26 for DKA secondary to a dental abscess.  Per patient's mom, the battery on his insulin pump died yesterday and patient did not tell his mother.  This morning, patient complained of abdominal pain and started vomiting.  Denied any recent fever, chills, or sickness.  Patient was at his baseline mental status yesterday.  Arrived to the ER lethargic, afebrile, tachycardic, tachypneic, and hypotensive with glucose of 975 on labs.  Other values noted for Na 131, K > 7.5,  sCr 2.63, BUN 42, CO2 < 7, WBC 37.8.  CXR negative.  UA negative. Hyperkalemia treated in addition to IV fluids and insulin drip.  PCCM to admit to ICU for severe DKA.   At present he is complaining of itching/burning in his inguinal area and asking for a fungal cream.  He also is very thirsty and wants to drink something other than water or at least sip water instead of using oral swabs.  His mother feels he looks better than upon arrival and that he was not as bad as when presenting in July.  SUBJECTIVE:  Asking for food and more water.  Remains on insulin gtt.  VITAL SIGNS: BP (!) 109/58   Pulse (!) 109   Temp 97.9 F (36.6 C) (Oral)   Resp (!) 25   Ht 5\' 2"  (1.575 m)   Wt 63.4 kg (139 lb 12.4 oz)   SpO2 99%   BMI 25.56 kg/m   HEMODYNAMICS:    VENTILATOR SETTINGS:    INTAKE / OUTPUT: I/O last 3 completed shifts: In: 5507 [P.O.:1080; I.V.:2327; IV Piggyback:2100] Out: 1800 [Urine:1800]  PHYSICAL EXAMINATION: General:  Young male, in NAD. Neuro:  A&O x 3, no deficits. HEENT:   [place] / AT, EOMI, MMM. Cardiovascular:  Tachy, regular. Lungs:  CTAB. Abdomen:  BS x 4, S/NT/ND. Musculoskeletal:  No red warm swollen tender deformed joints. Skin:  Warm, dry.  LABS:  BMET  Recent Labs Lab 07/09/17 1751 07/09/17 2106 07/10/17 0313  NA 138 143 137  K 4.9 4.0 3.5  CL 107 115* 113*  CO2 <7* 12* 19*  BUN 38* 30* 19  CREATININE 2.22* 1.68* 0.97  GLUCOSE 698* 392* 155*    Electrolytes  Recent Labs Lab 07/09/17 1751 07/09/17 2106 07/10/17 0313  CALCIUM 8.0* 7.8* 7.7*  MG  --  1.7  --   PHOS  --   --  2.0*    CBC  Recent Labs Lab 07/09/17 1439 07/09/17 1518 07/09/17 1751 07/10/17 0313  WBC 37.8*  --  32.9* 22.6*  HGB 13.3 15.6 12.9* 11.0*  HCT 41.0 46.0 38.1* 32.3*  PLT 413*  --  322 263    Coag's No results for input(s): APTT, INR in the last 168 hours.  Sepsis Markers  Recent Labs Lab 07/09/17 1751 07/09/17 2106 07/10/17 0313  LATICACIDVEN 4.2* 3.9*  --   PROCALCITON  --  27.04 27.09    ABG No results for input(s): PHART, PCO2ART, PO2ART in the last 168 hours.  Liver Enzymes No results for input(s): AST, ALT, ALKPHOS, BILITOT, ALBUMIN in  the last 168 hours.  Cardiac Enzymes No results for input(s): TROPONINI, PROBNP in the last 168 hours.  Glucose  Recent Labs Lab 07/10/17 0154 07/10/17 0259 07/10/17 0356 07/10/17 0454 07/10/17 0550 07/10/17 0650  GLUCAP 211* 157* 127* 99 96 80    Imaging Dg Chest Port 1 View  Result Date: 07/09/2017 CLINICAL DATA:  Ketoacidosis, type 1 diabetes, nonsmoker. EXAM: PORTABLE CHEST 1 VIEW COMPARISON:  Portable chest x-ray of May 28, 2017 FINDINGS: The lungs are adequately inflated. There is no focal infiltrate. The heart is top-normal in size. The pulmonary vascularity is normal. The mediastinum is normal in width. The trachea is midline. The bony thorax is unremarkable. IMPRESSION: There is no active cardiopulmonary disease. Electronically Signed   By: David  [place] M.D.   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  No obvious infectious source.   ASSESSMENT / PLAN:  PULMONARY A: No acute issues P: No interventions required.  CARDIOVASCULAR A:  Sepsis - unclear etiology at this point. ST- likely to dehydration + sepsis. Prolonged QTc  P:  Tele monitoring. Goal MAP > 65. See ID section.  RENAL A:   AKI- likely pre-renal.  Resolved AM 9/5. Hyperkalemia- secondary to acidosis.  Resolved AM 9/5. AGMA/lactic acidosis.  Non-anion gap acidosis with hyperchloremia. Hyponatremia 2/2 DKA - resolved AM 9/5. Hypophosphatemia. P:   Continue D5 1/2 NS. BMET q 4 hours. Trend BMP / urinary output. Replace electrolytes as indicated.  GASTROINTESTINAL A:   ABD pain 2/2 dka - improving. P:   Advance diet to heart healthy / carb mod as gap has now closed. Protonix for SUP.  HEMATOLOGIC A:   Mild anemia. VTE prophylaxis. P:  Transfuse for Hgb < 7. SCD's / Lovenox.  INFECTIOUS A:   Sepsis - unclear etiology at this point.  PCT 27.  UA negative and no blood cultures drawn yet. Possible inguinal fungal infection P:   Nystatin powder. Add empiric vanc / zosyn. Add blood cultures. Monitor fever curve/ WBC trend. Follow PCT.  ENDOCRINE A:   Severe DKA - likely due to insulin pump being off at home + occult infection.  Gap closed AM 9/5 and CBG's normalized after insulin gtt. IDDM on home insulin pump. P:   Transition to SSI. Continue D5 1/2 NS for now.  NEUROLOGIC A:   Metabolic encephalopathy - resolved. Hx multiple psych issues P:   Resume preadmission geodon,  Strattera, clonidine, depakote ER. Hold preadmission trazodone for now.  FAMILY  - Updates: mother updated bedside.  - Inter-disciplinary family meet or Palliative Care meeting due by:  07/16/17.   CC time: 30 min.   Rutherford Guys, [place] [facility] Pager: [phone]  or [phone] 07/10/2017, 7:58 AM

## 2017-07-10 NOTE — Progress Notes (Signed)
Hypoglycemic Event  CBG:57 At 2154  Treatment:Carbohydrate and OJ  Symptoms:muffle speech  Follow-up CBG: [id number] CBG Result:108  Possible Reasons for Event: Poor po intake Comments/MD notified:Hospitalist, Dr Lita Mainspyd    [place]-Pe[place], Eulah CitizenPauline

## 2017-07-10 NOTE — Progress Notes (Signed)
Inpatient Diabetes Program Recommendations  AACE/ADA: New Consensus Statement on Inpatient Glycemic Control (2015)  Target Ranges:  Prepandial:   less than 140 mg/dL      Peak postprandial:   less than 180 mg/dL (1-2 hours)      Critically ill patients:  140 - 180 mg/dL   Lab Results  Component Value Date   GLUCAP 101 (H) 07/10/2017   HGBA1C 9.2 (H) 12/06/2016    Diabetes history: DKA/ Type 1 diabetes Outpatient Diabetes medications: Insulin pump-670 G Per note from pump trainer, initial basal rate is 1 unit per hour which provides a total of 24 units in a 24 hour period.  The CHO ratio is 1 unit for every 10 grams of CHO , and correction factor is 1 unit for every 35 mg/dL> 478100 mg/dL Current orders for Inpatient glycemic control:  IV insulin- DKA order set  Inpatient Diabetes Program Recommendations:    Call received from RN regarding patient being ready to transition off insulin drip.  Called and discussed with Dr. Kendrick FriesMcQuaid and he would like patient to resume insulin pump per previous home settings.  Per RN the insulin pump had a dead battery which contributed to DKA incident.  She states that Mom is downstairs eating breakfast.  She will have Mom help with resumption of insulin pump once she returns.  Explained that patient needs to keep insulin drip running until 1 hour after insulin pump resumed.  Will also need to cover CHO using insulin drip until insulin pump resumed.  Orders received from MD.  Will follow.  Thanks, Beryl MeagerJenny Azaiah Licciardi, RN, BC-ADM Inpatient Diabetes Coordinator Pager [phone] (8a-5p)

## 2017-07-10 NOTE — Care Management Note (Signed)
Case Management Note  Patient Details  Name: Dylan Butler MRN: [id number] Date of Birth: 06/19/1996  Subjective/Objective:    hyperglycemia                Action/Plan: Date:  July 10, 2017 Chart reviewed for concurrent status and case management needs. Will continue to follow patient progress. Discharge Planning: following for needs Expected discharge date: [id number] Marcelle SmilingRhonda Davis, BSN, [place]3, [place]   [phone]  Expected Discharge Date:   (unknown)               Expected Discharge Plan:  Home/Self Care  In-House Referral:     Discharge planning Services  CM Consult  Post Acute Care Choice:    Choice offered to:     DME Arranged:    DME Agency:     HH Arranged:    HH Agency:     Status of Service:  In process, will continue to follow  If discussed at Long Length of Stay Meetings, dates discussed:    Additional Comments:  Golda AcreDavis, Rhonda Lynn, RN 07/10/2017, 8:15 AM

## 2017-07-11 DIAGNOSIS — E872 Acidosis: Secondary | ICD-10-CM

## 2017-07-11 DIAGNOSIS — R112 Nausea with vomiting, unspecified: Secondary | ICD-10-CM

## 2017-07-11 DIAGNOSIS — E1110 Type 2 diabetes mellitus with ketoacidosis without coma: Secondary | ICD-10-CM

## 2017-07-11 LAB — CBC WITH DIFFERENTIAL/PLATELET
BASOS ABS: 0 10*3/uL (ref 0.0–0.1)
Basophils Relative: 0 %
EOS PCT: 0 %
Eosinophils Absolute: 0 10*3/uL (ref 0.0–0.7)
HEMATOCRIT: 32.2 % — AB (ref 39.0–52.0)
HEMOGLOBIN: 11 g/dL — AB (ref 13.0–17.0)
LYMPHS ABS: 2.1 10*3/uL (ref 0.7–4.0)
LYMPHS PCT: 17 %
MCH: 27.8 pg (ref 26.0–34.0)
MCHC: 34.2 g/dL (ref 30.0–36.0)
MCV: 81.5 fL (ref 78.0–100.0)
Monocytes Absolute: 0.6 10*3/uL (ref 0.1–1.0)
Monocytes Relative: 5 %
NEUTROS ABS: 9.5 10*3/uL — AB (ref 1.7–7.7)
Neutrophils Relative %: 78 %
PLATELETS: 224 10*3/uL (ref 150–400)
RBC: 3.95 MIL/uL — AB (ref 4.22–5.81)
RDW: 14.3 % (ref 11.5–15.5)
WBC: 12.4 10*3/uL — AB (ref 4.0–10.5)

## 2017-07-11 LAB — BASIC METABOLIC PANEL
ANION GAP: 8 (ref 5–15)
BUN: 6 mg/dL (ref 6–20)
CHLORIDE: 112 mmol/L — AB (ref 101–111)
CO2: 27 mmol/L (ref 22–32)
Calcium: 8.8 mg/dL — ABNORMAL LOW (ref 8.9–10.3)
Creatinine, Ser: 0.79 mg/dL (ref 0.61–1.24)
Glucose, Bld: 134 mg/dL — ABNORMAL HIGH (ref 65–99)
POTASSIUM: 3.4 mmol/L — AB (ref 3.5–5.1)
SODIUM: 147 mmol/L — AB (ref 135–145)

## 2017-07-11 LAB — GLUCOSE, CAPILLARY
GLUCOSE-CAPILLARY: 128 mg/dL — AB (ref 65–99)
GLUCOSE-CAPILLARY: 119 mg/dL — AB (ref 65–99)
GLUCOSE-CAPILLARY: 165 mg/dL — AB (ref 65–99)

## 2017-07-11 LAB — MAGNESIUM: Magnesium: 1.7 mg/dL (ref 1.7–2.4)

## 2017-07-11 LAB — PHOSPHORUS: PHOSPHORUS: 2.7 mg/dL (ref 2.5–4.6)

## 2017-07-11 LAB — PROCALCITONIN: Procalcitonin: 18.08 ng/mL

## 2017-07-11 MED ORDER — NYSTATIN 100000 UNIT/GM EX POWD: Freq: Four times a day (QID) | CUTANEOUS | 1 refills | Status: AC

## 2017-07-11 MED ORDER — DOXYCYCLINE HYCLATE 100 MG PO CAPS: 100.0000 mg | ORAL_CAPSULE | Freq: Two times a day (BID) | ORAL | 0 refills | Status: DC

## 2017-07-11 NOTE — Discharge Instructions (Signed)
Follow with PCP in 2-3 weeks  Please get a complete blood count and chemistry panel checked by your Primary MD at your next visit, and again as instructed by your Primary MD. Please get your medications reviewed and adjusted by your Primary MD.  Please request your Primary MD to go over all Hospital Tests and Procedure/Radiological results at the follow up, please get all Hospital records sent to your Prim MD by signing hospital release before you go home.  If you had Pneumonia of Lung problems at the Hospital: Please get a 2 view Chest X ray done in 6-8 weeks after hospital discharge or sooner if instructed by your Primary MD.  If you have Congestive Heart Failure: Please call your Cardiologist or Primary MD anytime you have any of the following symptoms:  1) 3 pound weight gain in 24 hours or 5 pounds in 1 week  2) shortness of breath, with or without a dry hacking cough  3) swelling in the hands, feet or stomach  4) if you have to sleep on extra pillows at night in order to breathe  Follow cardiac low salt diet and 1.5 lit/day fluid restriction.  If you have diabetes Accuchecks 4 times/day, Once in AM empty stomach and then before each meal. Log in all results and show them to your primary doctor at your next visit. If any glucose reading is under 80 or above 300 call your primary MD immediately.  If you have Seizure/Convulsions/Epilepsy: Please do not drive, operate heavy machinery, participate in activities at heights or participate in high speed sports until you have seen by Primary MD or a Neurologist and advised to do so again.  If you had Gastrointestinal Bleeding: Please ask your Primary MD to check a complete blood count within one week of discharge or at your next visit. Your endoscopic/colonoscopic biopsies that are pending at the time of discharge, will also need to followed by your Primary MD.  Get Medicines reviewed and adjusted. Please take all your medications with you  for your next visit with your Primary MD  Please request your Primary MD to go over all hospital tests and procedure/radiological results at the follow up, please ask your Primary MD to get all Hospital records sent to his/her office.  If you experience worsening of your admission symptoms, develop shortness of breath, life threatening emergency, suicidal or homicidal thoughts you must seek medical attention immediately by calling 911 or calling your MD immediately  if symptoms less severe.  You must read complete instructions/literature along with all the possible adverse reactions/side effects for all the Medicines you take and that have been prescribed to you. Take any new Medicines after you have completely understood and accpet all the possible adverse reactions/side effects.   Do not drive or operate heavy machinery when taking Pain medications.   Do not take more than prescribed Pain, Sleep and Anxiety Medications  Special Instructions: If you have smoked or chewed Tobacco  in the last 2 yrs please stop smoking, stop any regular Alcohol  and or any Recreational drug use.  Wear Seat belts while driving.  Please note You were cared for by a hospitalist during your hospital stay. If you have any questions about your discharge medications or the care you received while you were in the hospital after you are discharged, you can call the unit and asked to speak with the hospitalist on call if the hospitalist that took care of you is not available. Once you are  discharged, your primary care physician will handle any further medical issues. Please note that NO REFILLS for any discharge medications will be authorized once you are discharged, as it is imperative that you return to your primary care physician (or establish a relationship with a primary care physician if you do not have one) for your aftercare needs so that they can reassess your need for medications and monitor your lab values.  You  can reach the hospitalist office at phone [phone] or fax [phone]   If you do not have a primary care physician, you can call [phone] for a physician referral.  Activity: As tolerated with Full fall precautions use walker/cane & assistance as needed  Diet: diabetic  Disposition Home

## 2017-07-11 NOTE — Progress Notes (Signed)
Discharge and medication instructions reviewed with mother and patient. Questions answered.  Both deny further questions. Two prescriptions given to mother. [place] Lina SarBeth Janis Cuffe, RN

## 2017-07-11 NOTE — Discharge Summary (Signed)
Physician Discharge Summary  Dylan Butler MGN:[id number] DOB: 08/31/1996 DOA: 07/09/2017  PCP: System, Provider Not In  Admit date: 07/09/2017 Discharge date: 07/11/2017  Admitted From: home Disposition:  home  Recommendations for Outpatient Follow-up:  1. Follow up with PCP in 1-2 weeks  Home Health: none Equipment/Devices: none  Discharge Condition: stable CODE STATUS: Full code Diet recommendation: diabetic  HPI: Per Dr. Simona Huh, 21 year old male with PMH of IDDM on insulin pump, ADHD, Bipolar, schizophrenia, multiple personality disorder who presented to the ER on 9/4 with hyperglycemia. Patient recently admitted 7/23 to 7/26 for DKA secondary to a dental abscess.  Per patient's mom, the battery on his insulin pump died yesterday and patient did not tell his mother.  This morning, patient complained of abdominal pain and started vomiting.  Denied any recent fever, chills, or sickness.  Patient was at his baseline mental status yesterday.  Arrived to the ER lethargic, afebrile, tachycardic, tachypneic, and hypotensive with glucose of 975 on labs.  Other values noted for Na 131, K > 7.5,  sCr 2.63, BUN 42, CO2 < 7, WBC 37.8.  CXR negative.  UA negative. Hyperkalemia treated in addition to IV fluids and insulin drip.  PCCM to admit to ICU for severe DKA.  At present he is complaining of itching/burning in his inguinal area and asking for a fungal cream.  He also is very thirsty and wants to drink something other than water or at least sip water instead of using oral swabs.  His mother feels he looks better than upon arrival and that he was not as bad as when presenting in July.  Hospital Course: Discharge Diagnoses:  Active Problems:   Nausea with vomiting   DKA (diabetic ketoacidoses) (HCC)   High anion gap metabolic acidosis   Discharge Instructions DKA in the setting of type 1 diabetes -patient with new DKA likely due to the fact that the pump malfunctioned has run out of battery and  patient did not tell anybody for this from time.  She was admitted to the ICU, was placed on IV insulin, IV fluids, his CBGs have improved and his anion gap is closed.  Bicarb is normalized.  He was started back on his insulin pump, monitored for additional 24 hours with good CBG control.  Diabetes educator has seen patient in the hospital as well.  His nausea vomiting abdominal pain have resolved, he is able to tolerate diabetic diet without further issues. Fungal groin rash -patient's mother tells me that he sometimes soils himself and does not clean himself properly, and this has been an ongoing issue.  He was placed on antibiotics out of concern for cellulitis, was also placed on nystatin.  His rash improved, will continue statin at home, educated about local hygiene, may need to see a dermatologist as an outpatient if persistent.  So likely has cellulitis, however will continue to cover with doxycycline for a few days following discharge as well. Leukocytosis -likely reactive, initially concern for infection, white count improving and close to normal discharge. History of multiple psychiatric issues -resume home medications on discharge   Allergies as of 07/11/2017      Reactions   Other Shortness Of Breath, Nausea And Vomiting   Honey Mustard      Medication List    STOP taking these medications   amoxicillin-clavulanate 875-125 MG tablet Commonly known as:  AUGMENTIN   cetirizine 10 MG tablet Commonly known as:  ZYRTEC   guanFACINE 2 MG tablet Commonly  known as:  TENEX   Insulin Glargine 100 UNIT/ML Solostar Pen Commonly known as:  LANTUS SOLOSTAR   ondansetron 4 MG tablet Commonly known as:  ZOFRAN   traZODone 150 MG tablet Commonly known as:  DESYREL     TAKE these medications   acetaminophen 500 MG tablet Commonly known as:  TYLENOL Take 500 mg by mouth every 6 (six) hours as needed for headache.   amantadine 100 MG capsule Commonly known as:  SYMMETREL Take 1 capsule  (100 mg total) by mouth at bedtime.   atomoxetine 80 MG capsule Commonly known as:  STRATTERA Take 80 mg by mouth daily.   atomoxetine 60 MG capsule Commonly known as:  STRATTERA Take 1 capsule (60 mg total) by mouth daily.   blood glucose meter kit and supplies Kit Dispense based on patient and insurance preference. Use up to four times daily as directed. (FOR ICD-9 250.00, 250.01).   cloNIDine 0.2 MG tablet Commonly known as:  CATAPRES Take 0.2 mg by mouth daily.   divalproex 250 MG 24 hr tablet Commonly known as:  DEPAKOTE ER Take 500 mg by mouth daily.   doxycycline 100 MG capsule Commonly known as:  VIBRAMYCIN Take 1 capsule (100 mg total) by mouth 2 (two) times daily.   fluticasone 50 MCG/ACT nasal spray Commonly known as:  FLONASE Place 2 sprays into both nostrils daily.   glucose blood test strip Commonly known as:  GLUCOSE METER TEST Use as instructed for 4 times daily testing of blood glucose.   insulin lispro 100 UNIT/ML KiwkPen Commonly known as:  HUMALOG KWIKPEN 15-25 Units Subcutaneous 3 times daily before meals as directed What changed:  how much to take  how to take this  when to take this  additional instructions   Insulin Pen Needle 31G X 6 MM Misc 1 Device by Does not apply route daily at 8 pm.   nystatin powder Commonly known as:  MYCOSTATIN/NYSTOP Apply topically 4 (four) times daily.   ziprasidone 80 MG capsule Commonly known as:  GEODON Take 160 mg by mouth at bedtime.   ziprasidone 60 MG capsule Commonly known as:  GEODON Take 2 capsules (120 mg total) by mouth at bedtime.            Discharge Care Instructions        Start     Ordered   07/11/17 0000  doxycycline (VIBRAMYCIN) 100 MG capsule  2 times daily     07/11/17 1426   07/11/17 0000  nystatin (MYCOSTATIN/NYSTOP) powder  4 times daily     07/11/17 1426      Allergies  Allergen Reactions  . Other Shortness Of Breath and Nausea And Vomiting    Honey Mustard      Consultations:  PCCM  Procedures/Studies:  Dg Chest Port 1 View  Result Date: 07/09/2017 CLINICAL DATA:  Ketoacidosis, type 1 diabetes, nonsmoker. EXAM: PORTABLE CHEST 1 VIEW COMPARISON:  Portable chest x-ray of May 28, 2017 FINDINGS: The lungs are adequately inflated. There is no focal infiltrate. The heart is top-normal in size. The pulmonary vascularity is normal. The mediastinum is normal in width. The trachea is midline. The bony thorax is unremarkable. IMPRESSION: There is no active cardiopulmonary disease. Electronically Signed   By: David  Martinique M.D.   On: 07/09/2017 15:41      Subjective: - no chest pain, shortness of breath, no abdominal pain, nausea or vomiting. Feels back to normal. Asking to go home   Discharge Exam: Vitals:  07/11/17 0436 07/11/17 1414  BP: (!) 109/53 113/73  Pulse: 87 93  Resp: 18 18  Temp: 97.8 F (36.6 C) 98.7 F (37.1 C)  SpO2: 98% 100%   Vitals:   07/10/17 1200 07/10/17 2155 07/11/17 0436 07/11/17 1414  BP: (!) 107/48 (!) 127/58 (!) 109/53 113/73  Pulse: (!) 120 (!) 101 87 93  Resp: (!) 23 18 18 18   Temp: 99.8 F (37.7 C) 98.8 F (37.1 C) 97.8 F (36.6 C) 98.7 F (37.1 C)  TempSrc: Oral Oral Oral Oral  SpO2: 99% 97% 98% 100%  Weight:      Height:        General: Pt is alert, awake, not in acute distress Cardiovascular: RRR, S1/S2 +, no rubs, no gallops Respiratory: CTA bilaterally, no wheezing, no rhonchi Abdominal: Soft, NT, ND, bowel sounds + GU: slightly erythematous rash inguinal folds Extremities: no edema, no cyanosis.     The results of significant diagnostics from this hospitalization (including imaging, microbiology, ancillary and laboratory) are listed below for reference.     Microbiology: Recent Results (from the past 240 hour(s))  MRSA PCR Screening     Status: None   Collection Time: 07/09/17  5:29 PM  Result Value Ref Range Status   MRSA by PCR NEGATIVE NEGATIVE Final    Comment:        The  GeneXpert MRSA Assay (FDA approved for NASAL specimens only), is one component of a comprehensive MRSA colonization surveillance program. It is not intended to diagnose MRSA infection nor to guide or monitor treatment for MRSA infections.   Culture, blood (Routine X 2) w Reflex to ID Panel     Status: None (Preliminary result)   Collection Time: 07/10/17  8:08 AM  Result Value Ref Range Status   Specimen Description BLOOD RIGHT ARM  Final   Special Requests   Final    BOTTLES DRAWN AEROBIC AND ANAEROBIC Blood Culture adequate volume   Culture   Final    NO GROWTH < 24 HOURS Performed at [facility], [address], [place], [place] [address]    Report Status PENDING  Incomplete  Culture, blood (Routine X 2) w Reflex to ID Panel     Status: None (Preliminary result)   Collection Time: 07/10/17  8:30 AM  Result Value Ref Range Status   Specimen Description BLOOD RIGHT ARM  Final   Special Requests   Final    BOTTLES DRAWN AEROBIC AND ANAEROBIC Blood Culture adequate volume   Culture   Final    NO GROWTH < 24 HOURS Performed at [facility], [place] [address], [place], [place] [address]    Report Status PENDING  Incomplete     Labs: BNP (last 3 results) No results for input(s): BNP in the last 8760 hours. Basic Metabolic Panel:  Recent Labs Lab 07/09/17 1751 07/09/17 2106 07/10/17 0313 07/10/17 0736 07/11/17 0521  NA 138 143 137 136 147*  K 4.9 4.0 3.5 3.7 3.4*  CL 107 115* 113* 112* 112*  CO2 <7* 12* 19* 17* 27  GLUCOSE 698* 392* 155* 103* 134*  BUN 38* 30* 19 15 6   CREATININE 2.22* 1.68* 0.97 0.78 0.79  CALCIUM 8.0* 7.8* 7.7* 7.9* 8.8*  MG  --  1.7  --   --  1.7  PHOS  --   --  2.0*  --  2.7   Liver Function Tests: No results for input(s): AST, ALT, ALKPHOS, BILITOT, PROT, ALBUMIN in the last  168 hours. No results for input(s): LIPASE, AMYLASE in the last 168 hours. No results for input(s): AMMONIA in the last 168 hours. CBC:  Recent Labs Lab  07/09/17 1439 07/09/17 1518 07/09/17 1751 07/10/17 0313 07/11/17 0521  WBC 37.8*  --  32.9* 22.6* 12.4*  NEUTROABS  --   --  27.7* 19.0* 9.5*  HGB 13.3 15.6 12.9* 11.0* 11.0*  HCT 41.0 46.0 38.1* 32.3* 32.2*  MCV 87.2  --  85.2 81.8 81.5  PLT 413*  --  322 263 224   Cardiac Enzymes: No results for input(s): CKTOTAL, CKMB, CKMBINDEX, TROPONINI in the last 168 hours. BNP: Invalid input(s): POCBNP CBG:  Recent Labs Lab 07/10/17 2151 07/10/17 2219 07/11/17 0430 07/11/17 0739 07/11/17 1137  GLUCAP 57* 108* 128* 119* 165*   D-Dimer No results for input(s): DDIMER in the last 72 hours. Hgb A1c No results for input(s): HGBA1C in the last 72 hours. Lipid Profile No results for input(s): CHOL, HDL, LDLCALC, TRIG, CHOLHDL, LDLDIRECT in the last 72 hours. Thyroid function studies No results for input(s): TSH, T4TOTAL, T3FREE, THYROIDAB in the last 72 hours.  Invalid input(s): FREET3 Anemia work up No results for input(s): VITAMINB12, FOLATE, FERRITIN, TIBC, IRON, RETICCTPCT in the last 72 hours. Urinalysis    Component Value Date/Time   COLORURINE YELLOW 07/09/2017 1648   APPEARANCEUR HAZY (A) 07/09/2017 1648   LABSPEC 1.020 07/09/2017 1648   PHURINE 5.0 07/09/2017 1648   GLUCOSEU >=500 (A) 07/09/2017 1648   HGBUR SMALL (A) 07/09/2017 1648   BILIRUBINUR NEGATIVE 07/09/2017 1648   BILIRUBINUR negative 12/06/2016 1453   KETONESUR 80 (A) 07/09/2017 1648   PROTEINUR 30 (A) 07/09/2017 1648   UROBILINOGEN 0.2 12/06/2016 1453   NITRITE NEGATIVE 07/09/2017 1648   LEUKOCYTESUR NEGATIVE 07/09/2017 1648   Sepsis Labs Invalid input(s): PROCALCITONIN,  WBC,  LACTICIDVEN Microbiology Recent Results (from the past 240 hour(s))  MRSA PCR Screening     Status: None   Collection Time: 07/09/17  5:29 PM  Result Value Ref Range Status   MRSA by PCR NEGATIVE NEGATIVE Final    Comment:        The GeneXpert MRSA Assay (FDA approved for NASAL specimens only), is one component of  a comprehensive MRSA colonization surveillance program. It is not intended to diagnose MRSA infection nor to guide or monitor treatment for MRSA infections.   Culture, blood (Routine X 2) w Reflex to ID Panel     Status: None (Preliminary result)   Collection Time: 07/10/17  8:08 AM  Result Value Ref Range Status   Specimen Description BLOOD RIGHT ARM  Final   Special Requests   Final    BOTTLES DRAWN AEROBIC AND ANAEROBIC Blood Culture adequate volume   Culture   Final    NO GROWTH < 24 HOURS Performed at [facility], [address], [place], [place] [address]    Report Status PENDING  Incomplete  Culture, blood (Routine X 2) w Reflex to ID Panel     Status: None (Preliminary result)   Collection Time: 07/10/17  8:30 AM  Result Value Ref Range Status   Specimen Description BLOOD RIGHT ARM  Final   Special Requests   Final    BOTTLES DRAWN AEROBIC AND ANAEROBIC Blood Culture adequate volume   Culture   Final    NO GROWTH < 24 HOURS Performed at [facility], [place] [address], [place], [place] [address]    Report Status PENDING  Incomplete  Time coordinating discharge: 40 minutes  SIGNED:  Marzetta Board, MD  [facility] 07/11/2017, 3:05 PM Pager [phone]  If 7PM-7AM, please contact night-coverage [email or web address] Password [email or web address]

## 2017-07-15 LAB — CULTURE, BLOOD (ROUTINE X 2)
Culture: NO GROWTH
Culture: NO GROWTH
Special Requests: ADEQUATE
Special Requests: ADEQUATE

## 2017-10-21 ENCOUNTER — Ambulatory Visit: Payer: Self-pay | Admitting: Family Medicine

## 2018-01-13 ENCOUNTER — Telehealth: Payer: Self-pay | Admitting: Nurse Practitioner

## 2018-01-13 NOTE — Telephone Encounter (Signed)
[facility], fax will be on the PCP in-box

## 2018-01-19 ENCOUNTER — Other Ambulatory Visit: Payer: Self-pay | Admitting: Nurse Practitioner

## 2018-01-21 ENCOUNTER — Telehealth: Payer: Self-pay | Admitting: *Deleted

## 2018-01-21 NOTE — Telephone Encounter (Signed)
CMA called back Diane from [facility] and [place] her [facility] has not seen this patient yet.

## 2018-01-21 NOTE — Telephone Encounter (Signed)
Diane from [facility] called and requested for an pre-authorization for patient. Please fu

## 2018-02-03 ENCOUNTER — Encounter (HOSPITAL_COMMUNITY): Payer: Self-pay | Admitting: Emergency Medicine

## 2018-02-03 ENCOUNTER — Inpatient Hospital Stay (HOSPITAL_COMMUNITY): Payer: Medicaid Other

## 2018-02-03 ENCOUNTER — Other Ambulatory Visit: Payer: Self-pay

## 2018-02-03 ENCOUNTER — Inpatient Hospital Stay (HOSPITAL_COMMUNITY)
Admission: EM | Admit: 2018-02-03 | Discharge: 2018-02-07 | DRG: 638 | Disposition: A | Discharge status: Home Health | Payer: Medicaid Other | Attending: Family Medicine | Admitting: Family Medicine

## 2018-02-03 DIAGNOSIS — I10 Essential (primary) hypertension: Secondary | ICD-10-CM | POA: Diagnosis present

## 2018-02-03 DIAGNOSIS — G459 Transient cerebral ischemic attack, unspecified: Secondary | ICD-10-CM | POA: Diagnosis not present

## 2018-02-03 DIAGNOSIS — R29898 Other symptoms and signs involving the musculoskeletal system: Secondary | ICD-10-CM | POA: Diagnosis not present

## 2018-02-03 DIAGNOSIS — F909 Attention-deficit hyperactivity disorder, unspecified type: Secondary | ICD-10-CM | POA: Diagnosis present

## 2018-02-03 DIAGNOSIS — F319 Bipolar disorder, unspecified: Secondary | ICD-10-CM | POA: Diagnosis present

## 2018-02-03 DIAGNOSIS — E876 Hypokalemia: Secondary | ICD-10-CM | POA: Diagnosis not present

## 2018-02-03 DIAGNOSIS — E1010 Type 1 diabetes mellitus with ketoacidosis without coma: Principal | ICD-10-CM | POA: Diagnosis present

## 2018-02-03 DIAGNOSIS — E860 Dehydration: Secondary | ICD-10-CM | POA: Diagnosis present

## 2018-02-03 DIAGNOSIS — R17 Unspecified jaundice: Secondary | ICD-10-CM | POA: Diagnosis present

## 2018-02-03 DIAGNOSIS — F4481 Dissociative identity disorder: Secondary | ICD-10-CM | POA: Diagnosis present

## 2018-02-03 DIAGNOSIS — Z888 Allergy status to other drugs, medicaments and biological substances status: Secondary | ICD-10-CM | POA: Diagnosis not present

## 2018-02-03 DIAGNOSIS — Z79899 Other long term (current) drug therapy: Secondary | ICD-10-CM

## 2018-02-03 DIAGNOSIS — Z7951 Long term (current) use of inhaled steroids: Secondary | ICD-10-CM

## 2018-02-03 DIAGNOSIS — F333 Major depressive disorder, recurrent, severe with psychotic symptoms: Secondary | ICD-10-CM | POA: Diagnosis present

## 2018-02-03 DIAGNOSIS — R6250 Unspecified lack of expected normal physiological development in childhood: Secondary | ICD-10-CM | POA: Diagnosis present

## 2018-02-03 DIAGNOSIS — E785 Hyperlipidemia, unspecified: Secondary | ICD-10-CM | POA: Diagnosis present

## 2018-02-03 DIAGNOSIS — Z794 Long term (current) use of insulin: Secondary | ICD-10-CM | POA: Diagnosis not present

## 2018-02-03 DIAGNOSIS — F209 Schizophrenia, unspecified: Secondary | ICD-10-CM | POA: Diagnosis not present

## 2018-02-03 DIAGNOSIS — R4781 Slurred speech: Secondary | ICD-10-CM | POA: Diagnosis not present

## 2018-02-03 DIAGNOSIS — F913 Oppositional defiant disorder: Secondary | ICD-10-CM | POA: Diagnosis present

## 2018-02-03 DIAGNOSIS — R112 Nausea with vomiting, unspecified: Secondary | ICD-10-CM

## 2018-02-03 DIAGNOSIS — Z91018 Allergy to other foods: Secondary | ICD-10-CM

## 2018-02-03 DIAGNOSIS — Q909 Down syndrome, unspecified: Secondary | ICD-10-CM

## 2018-02-03 DIAGNOSIS — R29702 NIHSS score 2: Secondary | ICD-10-CM | POA: Diagnosis not present

## 2018-02-03 DIAGNOSIS — F79 Unspecified intellectual disabilities: Secondary | ICD-10-CM | POA: Diagnosis present

## 2018-02-03 DIAGNOSIS — F419 Anxiety disorder, unspecified: Secondary | ICD-10-CM | POA: Diagnosis present

## 2018-02-03 DIAGNOSIS — R29810 Facial weakness: Secondary | ICD-10-CM | POA: Diagnosis not present

## 2018-02-03 DIAGNOSIS — E1110 Type 2 diabetes mellitus with ketoacidosis without coma: Secondary | ICD-10-CM | POA: Diagnosis present

## 2018-02-03 LAB — GLUCOSE, CAPILLARY
GLUCOSE-CAPILLARY: 332 mg/dL — AB (ref 65–99)
GLUCOSE-CAPILLARY: 326 mg/dL — AB (ref 65–99)
GLUCOSE-CAPILLARY: 270 mg/dL — AB (ref 65–99)
GLUCOSE-CAPILLARY: 176 mg/dL — AB (ref 65–99)
GLUCOSE-CAPILLARY: 197 mg/dL — AB (ref 65–99)
Glucose-Capillary: 276 mg/dL — ABNORMAL HIGH (ref 65–99)
Glucose-Capillary: 143 mg/dL — ABNORMAL HIGH (ref 65–99)
Glucose-Capillary: 178 mg/dL — ABNORMAL HIGH (ref 65–99)
Glucose-Capillary: 156 mg/dL — ABNORMAL HIGH (ref 65–99)
Glucose-Capillary: 166 mg/dL — ABNORMAL HIGH (ref 65–99)

## 2018-02-03 LAB — CBC
HCT: 46.8 % (ref 39.0–52.0)
HEMOGLOBIN: 15.6 g/dL (ref 13.0–17.0)
MCH: 27.9 pg (ref 26.0–34.0)
MCHC: 33.3 g/dL (ref 30.0–36.0)
MCV: 83.6 fL (ref 78.0–100.0)
Platelets: 303 10*3/uL (ref 150–400)
RBC: 5.6 MIL/uL (ref 4.22–5.81)
RDW: 12.9 % (ref 11.5–15.5)
WBC: 22.5 10*3/uL — ABNORMAL HIGH (ref 4.0–10.5)

## 2018-02-03 LAB — BASIC METABOLIC PANEL
ANION GAP: 23 — AB (ref 5–15)
Anion gap: 16 — ABNORMAL HIGH (ref 5–15)
Anion gap: 13 (ref 5–15)
BUN: 27 mg/dL — AB (ref 6–20)
BUN: 22 mg/dL — ABNORMAL HIGH (ref 6–20)
BUN: 18 mg/dL (ref 6–20)
CALCIUM: 8.4 mg/dL — AB (ref 8.9–10.3)
CALCIUM: 8.1 mg/dL — AB (ref 8.9–10.3)
CO2: 7 mmol/L — ABNORMAL LOW (ref 22–32)
CO2: 11 mmol/L — ABNORMAL LOW (ref 22–32)
CO2: 13 mmol/L — ABNORMAL LOW (ref 22–32)
Calcium: 8.6 mg/dL — ABNORMAL LOW (ref 8.9–10.3)
Chloride: 111 mmol/L (ref 101–111)
Chloride: 118 mmol/L — ABNORMAL HIGH (ref 101–111)
Chloride: 115 mmol/L — ABNORMAL HIGH (ref 101–111)
Creatinine, Ser: 1.12 mg/dL (ref 0.61–1.24)
Creatinine, Ser: 1.07 mg/dL (ref 0.61–1.24)
Creatinine, Ser: 0.91 mg/dL (ref 0.61–1.24)
GLUCOSE: 177 mg/dL — AB (ref 65–99)
Glucose, Bld: 338 mg/dL — ABNORMAL HIGH (ref 65–99)
Glucose, Bld: 181 mg/dL — ABNORMAL HIGH (ref 65–99)
Potassium: 4.8 mmol/L (ref 3.5–5.1)
Potassium: 4.2 mmol/L (ref 3.5–5.1)
Potassium: 4 mmol/L (ref 3.5–5.1)
SODIUM: 141 mmol/L (ref 135–145)
SODIUM: 141 mmol/L (ref 135–145)
Sodium: 145 mmol/L (ref 135–145)

## 2018-02-03 LAB — COMPREHENSIVE METABOLIC PANEL
ALBUMIN: 4.4 g/dL (ref 3.5–5.0)
ALK PHOS: 160 U/L — AB (ref 38–126)
ALT: 21 U/L (ref 17–63)
ANION GAP: 27 — AB (ref 5–15)
AST: 21 U/L (ref 15–41)
BUN: 28 mg/dL — AB (ref 6–20)
CALCIUM: 9.2 mg/dL (ref 8.9–10.3)
CO2: 7 mmol/L — AB (ref 22–32)
CREATININE: 1.07 mg/dL (ref 0.61–1.24)
Chloride: 103 mmol/L (ref 101–111)
GFR calc Af Amer: >60 mL/min (ref >60)
GFR calc non Af Amer: >60 mL/min (ref >60)
GLUCOSE: 498 mg/dL — AB (ref 65–99)
Potassium: 5.3 mmol/L — ABNORMAL HIGH (ref 3.5–5.1)
Sodium: 137 mmol/L (ref 135–145)
Total Bilirubin: 1.5 mg/dL — ABNORMAL HIGH (ref 0.3–1.2)
Total Protein: 7.8 g/dL (ref 6.5–8.1)

## 2018-02-03 LAB — URINALYSIS, ROUTINE W REFLEX MICROSCOPIC
Bacteria, UA: NONE SEEN
Bilirubin Urine: NEGATIVE
Ketones, ur: 80 mg/dL — AB
Leukocytes, UA: NEGATIVE
NITRITE: NEGATIVE
PROTEIN: 30 mg/dL — AB
SPECIFIC GRAVITY, URINE: 1.023 (ref 1.005–1.030)
Squamous Epithelial / LPF: NONE SEEN
pH: 5 (ref 5.0–8.0)

## 2018-02-03 LAB — I-STAT CHEM 8, ED
BUN: 27 mg/dL — AB (ref 6–20)
Calcium, Ion: 1.15 mmol/L (ref 1.15–1.40)
Chloride: 108 mmol/L (ref 101–111)
Creatinine, Ser: 0.6 mg/dL — ABNORMAL LOW (ref 0.61–1.24)
Glucose, Bld: 505 mg/dL (ref 65–99)
HEMATOCRIT: 52 % (ref 39.0–52.0)
Hemoglobin: 17.7 g/dL — ABNORMAL HIGH (ref 13.0–17.0)
POTASSIUM: 5 mmol/L (ref 3.5–5.1)
Sodium: 137 mmol/L (ref 135–145)
TCO2: 10 mmol/L — AB (ref 22–32)

## 2018-02-03 LAB — MAGNESIUM: MAGNESIUM: 2.1 mg/dL (ref 1.7–2.4)

## 2018-02-03 LAB — MRSA PCR SCREENING: MRSA BY PCR: NEGATIVE

## 2018-02-03 LAB — CBG MONITORING, ED
Glucose-Capillary: 589 mg/dL (ref 65–99)
Glucose-Capillary: 499 mg/dL — ABNORMAL HIGH (ref 65–99)
Glucose-Capillary: 460 mg/dL — ABNORMAL HIGH (ref 65–99)

## 2018-02-03 LAB — PHOSPHORUS: PHOSPHORUS: 6 mg/dL — AB (ref 2.5–4.6)

## 2018-02-03 LAB — HEMOGLOBIN A1C
HEMOGLOBIN A1C: 11.9 % — AB (ref 4.8–5.6)
MEAN PLASMA GLUCOSE: 294.83 mg/dL

## 2018-02-03 LAB — LIPASE, BLOOD: Lipase: 16 U/L (ref 11–51)

## 2018-02-03 MED ORDER — ENOXAPARIN SODIUM 40 MG/0.4ML ~~LOC~~ SOLN
40.0000 mg | SUBCUTANEOUS | Status: DC
  Administered 2018-02-03 – 2018-02-06 (×4): 40 mg via SUBCUTANEOUS
  Filled 2018-02-03 (×4): qty 0.4

## 2018-02-03 MED ORDER — SODIUM CHLORIDE 0.9 % IV BOLUS
1000.0000 mL | Freq: Once | INTRAVENOUS | Status: AC
  Administered 2018-02-03: 1000 mL via INTRAVENOUS

## 2018-02-03 MED ORDER — ZIPRASIDONE HCL 80 MG PO CAPS
160.0000 mg | ORAL_CAPSULE | Freq: Every day | ORAL | Status: DC
  Administered 2018-02-03 – 2018-02-06 (×4): 160 mg via ORAL
  Filled 2018-02-03 (×6): qty 2

## 2018-02-03 MED ORDER — DEXTROSE-NACL 5-0.45 % IV SOLN
INTRAVENOUS | Status: DC
  Administered 2018-02-03 – 2018-02-04 (×3): via INTRAVENOUS

## 2018-02-03 MED ORDER — ACETAMINOPHEN 500 MG PO TABS: 1000.0000 mg | ORAL_TABLET | Freq: Four times a day (QID) | ORAL | Status: DC | PRN

## 2018-02-03 MED ORDER — DIVALPROEX SODIUM ER 500 MG PO TB24
500.0000 mg | ORAL_TABLET | Freq: Every day | ORAL | Status: DC
Start: 2018-02-03 — End: 2018-02-07
  Administered 2018-02-03 – 2018-02-07 (×5): 500 mg via ORAL
  Filled 2018-02-03 (×3): qty 2
  Filled 2018-02-03: qty 1
  Filled 2018-02-03: qty 2

## 2018-02-03 MED ORDER — DEXTROSE-NACL 5-0.45 % IV SOLN: INTRAVENOUS | Status: DC

## 2018-02-03 MED ORDER — FLUTICASONE PROPIONATE 50 MCG/ACT NA SUSP
2.0000 | Freq: Every day | NASAL | Status: DC
  Administered 2018-02-03 – 2018-02-05 (×3): 2 via NASAL
  Filled 2018-02-03: qty 16

## 2018-02-03 MED ORDER — METOPROLOL TARTRATE 25 MG PO TABS
25.0000 mg | ORAL_TABLET | Freq: Two times a day (BID) | ORAL | Status: AC
  Administered 2018-02-03 – 2018-02-05 (×4): 25 mg via ORAL
  Filled 2018-02-03 (×4): qty 1

## 2018-02-03 MED ORDER — METOPROLOL TARTRATE 5 MG/5ML IV SOLN
5.0000 mg | Freq: Four times a day (QID) | INTRAVENOUS | Status: DC | PRN
  Administered 2018-02-03 – 2018-02-04 (×2): 5 mg via INTRAVENOUS
  Filled 2018-02-03 (×2): qty 5

## 2018-02-03 MED ORDER — ONDANSETRON HCL 4 MG/2ML IJ SOLN
4.0000 mg | Freq: Once | INTRAMUSCULAR | Status: AC
  Administered 2018-02-03: 4 mg via INTRAVENOUS
  Filled 2018-02-03: qty 2

## 2018-02-03 MED ORDER — POTASSIUM CHLORIDE 10 MEQ/100ML IV SOLN: 10.0000 meq | INTRAVENOUS | Status: AC

## 2018-02-03 MED ORDER — SODIUM CHLORIDE 0.9 % IV SOLN
INTRAVENOUS | Status: AC
  Administered 2018-02-03: 11:00:00 via INTRAVENOUS

## 2018-02-03 MED ORDER — ONDANSETRON HCL 4 MG/2ML IJ SOLN
4.0000 mg | Freq: Four times a day (QID) | INTRAMUSCULAR | Status: DC | PRN
  Administered 2018-02-03: 4 mg via INTRAVENOUS
  Filled 2018-02-03: qty 2

## 2018-02-03 MED ORDER — SODIUM CHLORIDE 0.9 % IV SOLN
INTRAVENOUS | Status: DC
  Administered 2018-02-03: 12:00:00 via INTRAVENOUS

## 2018-02-03 MED ORDER — ATOMOXETINE HCL 40 MG PO CAPS
80.0000 mg | ORAL_CAPSULE | Freq: Every day | ORAL | Status: DC
  Administered 2018-02-03 – 2018-02-06 (×4): 80 mg via ORAL
  Filled 2018-02-03 (×6): qty 2

## 2018-02-03 MED ORDER — SODIUM CHLORIDE 0.9 % IV SOLN
INTRAVENOUS | Status: DC
  Filled 2018-02-03: qty 1

## 2018-02-03 MED ORDER — SODIUM CHLORIDE 0.9 % IV SOLN
INTRAVENOUS | Status: DC
  Administered 2018-02-03: 4 [IU]/h via INTRAVENOUS
  Administered 2018-02-04: 2.2 [IU]/h via INTRAVENOUS
  Filled 2018-02-03 (×2): qty 1

## 2018-02-03 NOTE — ED Notes (Signed)
Bed: WA15 Expected date:  Expected time:  Means of arrival:  Comments: 

## 2018-02-03 NOTE — ED Notes (Signed)
This RN stuck patient twice for blood work, both attempts unsuccessful.  

## 2018-02-03 NOTE — Progress Notes (Signed)
Inpatient Diabetes Program Recommendations  AACE/ADA: New Consensus Statement on Inpatient Glycemic Control (2015)  Target Ranges:  Prepandial:   less than 140 mg/dL      Peak postprandial:   less than 180 mg/dL (1-2 hours)      Critically ill patients:  140 - 180 mg/dL   Lab Results  Component Value Date   GLUCAP 270 (H) 02/03/2018   HGBA1C 11.9 (H) 02/03/2018    Review of Glycemic Control  Diabetes history: DM1 Outpatient Diabetes medications: Insulin pump Current orders for Inpatient glycemic control: Insulin drip per DKA orders HgbA1C - 11.9% - uncontrolled  Inpatient Diabetes Program Recommendations:     Continue with insulin drip until criteria met for discontinuation. Mother has insulin pump and supplies - put pump back on at lest 1-2 hours before discontinuation of drip.  Talked with pt and Mom regarding high HgbA1C. Mom states that pt has had cold and infections seem to run blood sugars up. States pt has a lot of stress with moving and brother leaving.  Continue with insulin drip until DKA is resolved.  Thank you. Lorenda Peck, RD, LDN, CDE Inpatient Diabetes Coordinator [phone]

## 2018-02-03 NOTE — ED Notes (Signed)
US at bedside

## 2018-02-03 NOTE — ED Triage Notes (Signed)
Per EMS pt initial CBG read low; given 1 squirt of instant glucose, 6 grams of D50, and 500 cc NS; CBG now 278. Complaint of fever and vomiting.

## 2018-02-03 NOTE — ED Notes (Signed)
PA [place] notified of patient's lab result.

## 2018-02-03 NOTE — H&P (Signed)
History and Physical    Dylan Butler ZOX:[id number] DOB: [birth year] DOA: 02/03/2018  PCP: System, Provider Not In Patient coming from: Home  Chief Complaint: Mother brought him in because he was smelling like cotton candy and she knew he was in DKA.  HPI: Dylan Butler is a 22 y.o. male with medical history significant of type 1 diabetes history of recurrent DKA, mental retardation mother reports he has a mind of a 31 year old, ADHD, anxiety, schizophernia, bipolar disorder admitted with intractable nausea and vomiting.  Patient lives with his mother and father at home.  She reported that a week ago he had a ear infection was treated with antibiotics.  She also reported that every time he has any kind of infection his blood sugar goes up.  Looking through the triage notes it does say he is EMS reported her his CBG to be low so they gave him 6 g of D50, 500 cc of normal saline and CBG was 278.  But upon arrival to the ER his CBG was 589 and glucose in BMP was 505.  Mother denies that patient had any fever chills. No urinary complaints.  Mother reported some diarrhea but has been resolved.  Ed course -his sodium was 137 potassium 5.0 BUN 27 creatinine 0.60 blood sugar 505. hemoglobin 17.7 hematocrit 52.  Alkaline phosphatase was 160 total bilirubin 1.5 WBC count was 22.5 platelet count 303.  Right upper quadrant ultrasound normal. Review of Systems: As per HPI otherwise all other systems reviewed and are negative  Past Medical History:  Diagnosis Date  . ADHD (attention deficit hyperactivity disorder)   . Anxiety   . Bipolar 1 disorder ([place])   . Diabetes mellitus without complication ([place])   . 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 ODD (oppositional defiant disorder)   . Schizophrenia [facility])     Past Surgical History:  Procedure Laterality Date  . HERNIA REPAIR      Social History   Socioeconomic History  . Marital status: Single    Spouse name: Not on  file  . Number of children: Not on file  . Years of education: Not on file  . Highest education level: Not on file  Occupational History  . Not on file  Social Needs  . Financial resource strain: Not on file  . Food insecurity:    Worry: Not on file    Inability: Not on file  . Transportation needs:    Medical: Not on file    Non-medical: Not on file  Tobacco Use  . Smoking status: Never Smoker  . Smokeless tobacco: Never Used  Substance and Sexual Activity  . Alcohol use: No  . Drug use: No  . Sexual activity: Not on file  Lifestyle  . Physical activity:    Days per week: Not on file    Minutes per session: Not on file  . Stress: Not on file  Relationships  . Social connections:    Talks on phone: Not on file    Gets together: Not on file    Attends religious service: Not on file    Active member of club or organization: Not on file    Attends meetings of clubs or organizations: Not on file    Relationship status: Not on file  . Intimate partner violence:    Fear of current or ex partner: Not on file    Emotionally abused: Not on file    Physically abused:  Not on file    Forced sexual activity: Not on file  Other Topics Concern  . Not on file  Social History Narrative  . Not on file    Allergies  Allergen Reactions  . Other Shortness Of Breath and Nausea And Vomiting    Honey Mustard    Family History  Problem Relation Age of Onset  . Diabetes Mellitus II Maternal Grandmother   . CAD Maternal Grandmother   . Seizures Mother       Prior to Admission medications   Medication Sig Start Date End Date Taking? Authorizing Provider  acetaminophen (TYLENOL) 500 MG tablet Take 1,000 mg by mouth every 6 (six) hours as needed for headache.    Yes [provider]  atomoxetine (STRATTERA) 80 MG capsule Take 80 mg by mouth daily.   Yes [provider]  cloNIDine (CATAPRES) 0.2 MG tablet Take 0.2 mg by mouth daily.   Yes [provider]    divalproex (DEPAKOTE ER) 250 MG 24 hr tablet Take 500 mg by mouth daily.   Yes [provider]  fluticasone (FLONASE) 50 MCG/ACT nasal spray Place 2 sprays into both nostrils daily.   Yes [provider]  Insulin Human (INSULIN PUMP) SOLN Inject 1.25 each into the skin. Humalog.   Yes [provider]  nystatin (MYCOSTATIN/NYSTOP) powder Apply topically 4 (four) times daily. Patient taking differently: Apply 1 Bottle topically 4 (four) times daily as needed (itching "downstairs").  07/11/17  Yes Caren Griffins, MD  ziprasidone (GEODON) 80 MG capsule Take 160 mg by mouth at bedtime.    Yes [provider]  amantadine (SYMMETREL) 100 MG capsule Take 1 capsule (100 mg total) by mouth at bedtime. Patient not taking: Reported on 05/28/2017 02/01/17   Patrecia Pour, NP  atomoxetine (STRATTERA) 60 MG capsule Take 1 capsule (60 mg total) by mouth daily. Patient not taking: Reported on 05/28/2017 02/01/17   Patrecia Pour, NP  blood glucose meter kit and supplies KIT Dispense based on patient and insurance preference. Use up to four times daily as directed. (FOR ICD-9 250.00, 250.01). 08/03/16   Theodis Blaze, MD  doxycycline (VIBRAMYCIN) 100 MG capsule Take 1 capsule (100 mg total) by mouth 2 (two) times daily. Patient not taking: Reported on 02/03/2018 07/11/17   Caren Griffins, MD  glucose blood (GLUCOSE METER TEST) test strip Use as instructed for 4 times daily testing of blood glucose. 12/14/16   Tresa Garter, MD  insulin lispro (HUMALOG KWIKPEN) 100 UNIT/ML KiwkPen 15-25 Units Subcutaneous 3 times daily before meals as directed Patient not taking: Reported on 02/03/2018 11/27/16   Murlean Iba, MD  Insulin Pen Needle 31G X 6 MM MISC 1 Device by Does not apply route daily at 8 pm. 11/29/16   Emokpae, Ejiroghene E, MD  ziprasidone (GEODON) 60 MG capsule Take 2 capsules (120 mg total) by mouth at bedtime. Patient not taking: Reported on 05/28/2017 02/01/17    Patrecia Pour, NP    Physical Exam: Vitals:   02/03/18 0815 02/03/18 0821 02/03/18 0854 02/03/18 0938  BP:  (!) 133/55  (!) 123/56  Pulse:  98  (!) 125  Resp:  16  (!) 21  Temp:  (!) 97.5 F (36.4 C)    TempSrc:  Oral    SpO2: 100% 100%  99%  Weight:   65.8 kg (145 lb)   Height:   5' 2.75" (1.594 m)      General:  Appears  calm and comfortable Eyes:  PERRL, EOMI, normal lids,  ENT:  grossly normal hearing, lips & tongue, mmm dry.  Neck:  no LAD, masses or thyromegaly Cardiovascular:  RRR, no m/r/g. No LE edema.  Respiratory:  CTA bilaterally, no w/r/r. Normal respiratory effort. Abdomen:  soft, ntnd, NABS Skin:  no rash or induration seen on limited exam Musculoskeletal:  grossly normal tone BUE/BLE, good ROM, no bony abnormality Neurologic:  CN 2-12 grossly intact, moves all extremities in coordinated fashion, sensation intact  Labs on Admission: I have personally reviewed following labs and imaging studies  CBC: Recent Labs  Lab 02/03/18 1001  HGB 17.7*  HCT 78.2   Basic Metabolic Panel: Recent Labs  Lab 02/03/18 1001  NA 137  K 5.0  CL 108  GLUCOSE 505*  BUN 27*  CREATININE 0.60*   GFR: Estimated Creatinine Clearance: 117.3 mL/min (A) (by C-G formula based on SCr of 0.6 mg/dL (L)). Liver Function Tests: No results for input(s): AST, ALT, ALKPHOS, BILITOT, PROT, ALBUMIN in the last 168 hours. No results for input(s): LIPASE, AMYLASE in the last 168 hours. No results for input(s): AMMONIA in the last 168 hours. Coagulation Profile: No results for input(s): INR, PROTIME in the last 168 hours. Cardiac Enzymes: No results for input(s): CKTOTAL, CKMB, CKMBINDEX, TROPONINI in the last 168 hours. BNP (last 3 results) No results for input(s): PROBNP in the last 8760 hours. HbA1C: No results for input(s): HGBA1C in the last 72 hours. CBG: Recent Labs  Lab 02/03/18 0826 02/03/18 0937  GLUCAP 589* 499*   Lipid Profile: No results for input(s): CHOL, HDL,  LDLCALC, TRIG, CHOLHDL, LDLDIRECT in the last 72 hours. Thyroid Function Tests: No results for input(s): TSH, T4TOTAL, FREET4, T3FREE, THYROIDAB in the last 72 hours. Anemia Panel: No results for input(s): VITAMINB12, FOLATE, FERRITIN, TIBC, IRON, RETICCTPCT in the last 72 hours. Urine analysis:    Component Value Date/Time   COLORURINE YELLOW 02/03/2018 0819   APPEARANCEUR CLEAR 02/03/2018 0819   LABSPEC 1.023 02/03/2018 0819   PHURINE 5.0 02/03/2018 0819   GLUCOSEU >=500 (A) 02/03/2018 0819   HGBUR SMALL (A) 02/03/2018 0819   BILIRUBINUR NEGATIVE 02/03/2018 0819   BILIRUBINUR negative 12/06/2016 1453   KETONESUR 80 (A) 02/03/2018 0819   PROTEINUR 30 (A) 02/03/2018 0819   UROBILINOGEN 0.2 12/06/2016 1453   NITRITE NEGATIVE 02/03/2018 0819   LEUKOCYTESUR NEGATIVE 02/03/2018 0819    Creatinine Clearance: Estimated Creatinine Clearance: 117.3 mL/min (A) (by C-G formula based on SCr of 0.6 mg/dL (L)).  Sepsis Labs: @LABRCNTIP (procalcitonin:4,lacticidven:4) )No results found for this or any previous visit (from the past 240 hour(s)).   Radiological Exams on Admission: No results found.  EKG: Independently reviewed.  Assessment/Plan Active Problems:   * No active hospital problems. *   1] high gap diabetic ketoacidosis in a patient with type 1 diabetes.-patient presented with complaints of intractable nausea and vomiting after a recent ear infection.  He was treated with antibiotics.  Patient has a insulin pump.  Patient has history of multiple admissions for DKA.  Patient lives at home with his parents.  His blood sugar was found to be 505 with a gap of 27 and a CO2 of 7.  He was started on IV insulin in the ER.  A right upper quadrant ultrasound was done in view of increased alkaline phosphatase and elevated bilirubin.  It did not show any evidence of gallstones or cholecystitis.  2] leukocytosis no evidence of infection found so far.  Could be  related to DKA.  And dehydration.   Follow-up tomorrow.  3] history of ADHD/bipolar disorder restart home medications.  4] elevated LFTs patient has mild elevation in alkaline phosphatase and T bilirubin follow-up levels tomorrow.  Ultrasound done in the ER shows no acute changes.   DVT prophylaxis: Lovenox Code Status: Full code Family Communication: Discussed with mother Disposition Plan: TBD Consults called: None Admission status: Inpatient   Georgette Shell MD [facility]  If 7PM-7AM, please contact night-coverage [email or web address] Password [email or web address]  02/03/2018, 10:12 AM

## 2018-02-03 NOTE — ED Provider Notes (Addendum)
[facility]-ICU/STEPDOWN Provider Note   CSN: [id number] Arrival date & time: 02/03/18  4628     History   Chief Complaint Chief Complaint  Patient presents with  . Hypoglycemia    HPI Dylan Butler is a 22 y.o. male presenting for evaluation of hyperglycemia.  History given by Dylan Butler, April, who is patient's legal guardian. Level V caveat due to pt's mental status (baseline per Dylan Butler).  Dylan Butler states patient started to have nausea and vomiting last night.  This continued throughout today.  His blood sugar last night was btwn 100 and 200.  ?subjective fever. She denies chills, cough, complaints of chest pain, complaints of abdominal pain, abnormal urination, abnormal bowel movements.  He recently had an ear infection which was treated by his primary care doctor, an biotics finished a week ago.  He has an insulin pump, no known problems with this.  EMS got to the house, and there meter read that his blood sugar was low.  He was thus given oral glucose and D50.  Patient's Dylan Butler and dad both have symptoms of gastroenteritis with nausea and diarrhea.  HPI  Past Medical History:  Diagnosis Date  . ADHD (attention deficit hyperactivity disorder)   . Anxiety   . Bipolar 1 disorder ([place])   . Diabetes mellitus without complication ([place])   . 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 ODD (oppositional defiant disorder)   . Schizophrenia [facility])     Patient Active Problem List   Diagnosis Date Noted  . Fall   . Dental abscess   . Hyperkalemia 05/28/2017  . High anion gap metabolic acidosis 63/81/7711  . AKI (acute kidney injury) ([place]) 05/28/2017  . Major depressive disorder, recurrent episode, severe, with psychosis ([place]) 01/30/2017  . DKA (diabetic ketoacidoses) ([place]) 11/26/2016  . Tachycardia 07/31/2016  . Ketoacidosis 07/31/2016  . Controlled type 1 diabetes mellitus with hyperglycemia ([place]) 07/31/2016  . Nausea with vomiting 07/31/2016   . DKA, type 1 ([place]) 07/31/2016    Past Surgical History:  Procedure Laterality Date  . HERNIA REPAIR          Home Medications    Prior to Admission medications   Medication Sig Start Date End Date Taking? Authorizing Provider  acetaminophen (TYLENOL) 500 MG tablet Take 1,000 mg by mouth every 6 (six) hours as needed for headache.    Yes [provider]  atomoxetine (STRATTERA) 80 MG capsule Take 80 mg by mouth daily.   Yes [provider]  cloNIDine (CATAPRES) 0.2 MG tablet Take 0.2 mg by mouth daily.   Yes [provider]  divalproex (DEPAKOTE ER) 250 MG 24 hr tablet Take 500 mg by mouth daily.   Yes [provider]  fluticasone (FLONASE) 50 MCG/ACT nasal spray Place 2 sprays into both nostrils daily.   Yes [provider]  Insulin Human (INSULIN PUMP) SOLN Inject 1.25 each into the skin. Humalog.   Yes [provider]  nystatin (MYCOSTATIN/NYSTOP) powder Apply topically 4 (four) times daily. Patient taking differently: Apply 1 Bottle topically 4 (four) times daily as needed (itching "downstairs").  07/11/17  Yes Caren Griffins, MD  ziprasidone (GEODON) 80 MG capsule Take 160 mg by mouth at bedtime.    Yes [provider]  amantadine (SYMMETREL) 100 MG capsule Take 1 capsule (100 mg total) by mouth at bedtime. Patient not taking: Reported on 05/28/2017 02/01/17   Patrecia Pour, NP  atomoxetine (STRATTERA) 60 MG  capsule Take 1 capsule (60 mg total) by mouth daily. Patient not taking: Reported on 05/28/2017 02/01/17   Patrecia Pour, NP  blood glucose meter kit and supplies KIT Dispense based on patient and insurance preference. Use up to four times daily as directed. (FOR ICD-9 250.00, 250.01). 08/03/16   Theodis Blaze, MD  doxycycline (VIBRAMYCIN) 100 MG capsule Take 1 capsule (100 mg total) by mouth 2 (two) times daily. Patient not taking: Reported on 02/03/2018 07/11/17   Caren Griffins, MD  glucose blood (GLUCOSE  METER TEST) test strip Use as instructed for 4 times daily testing of blood glucose. 12/14/16   Tresa Garter, MD  insulin lispro (HUMALOG KWIKPEN) 100 UNIT/ML KiwkPen 15-25 Units Subcutaneous 3 times daily before meals as directed Patient not taking: Reported on 02/03/2018 11/27/16   Murlean Iba, MD  Insulin Pen Needle 31G X 6 MM MISC 1 Device by Does not apply route daily at 8 pm. 11/29/16   Emokpae, Ejiroghene E, MD  ziprasidone (GEODON) 60 MG capsule Take 2 capsules (120 mg total) by mouth at bedtime. Patient not taking: Reported on 05/28/2017 02/01/17   Patrecia Pour, NP    Family History Family History  Problem Relation Age of Onset  . Diabetes Mellitus II Maternal Grandmother   . CAD Maternal Grandmother   . Seizures Mother     Social History Social History   Tobacco Use  . Smoking status: Never Smoker  . Smokeless tobacco: Never Used  Substance Use Topics  . Alcohol use: No  . Drug use: No     Allergies   Other   Review of Systems Review of Systems  Unable to perform ROS: Other  Constitutional: Positive for fever (subjective).  Gastrointestinal: Positive for abdominal pain, nausea and vomiting.     Physical Exam Updated Vital Signs BP (!) 145/67   Pulse (!) 150   Temp 98.4 F (36.9 C) (Oral)   Resp (!) 23   Ht _0  (1.575 m)   Wt 66.3 kg (146 lb 2.6 oz)   SpO2 99%   BMI 26.73 kg/m   Physical Exam  Constitutional: He appears well-developed and well-nourished.  Patient appears uncomfortable due to pain and vomiting.  Appears dehydrated.  HENT:  Head: Normocephalic and atraumatic.  Right Ear: Tympanic membrane, external ear and ear canal normal.  Left Ear: Tympanic membrane, external ear and ear canal normal.  Nose: Nose normal.  Mouth/Throat: Mucous membranes are dry.  Eyes: Pupils are equal, round, and reactive to light. Conjunctivae and EOM are normal.  Neck: Normal range of motion. Neck supple.  Cardiovascular: Normal rate, regular  rhythm and intact distal pulses.  Pulmonary/Chest: Effort normal and breath sounds normal. No respiratory distress. He has no wheezes.  Abdominal: Soft. He exhibits no distension and no mass. There is tenderness. There is no guarding.  Patient actively vomiting while I am in the room.  Tenderness to palpation of epigastric abdomen.  Exam limited due to vomiting.  Musculoskeletal: Normal range of motion.  Neurological:  Baseline mental status per Dylan Butler.  Will respond with 1-2 word answers to basic questions.  Skin: Skin is warm and dry.  Psychiatric: He has a normal mood and affect.  Nursing note and vitals reviewed.    ED Treatments / Results  Labs (all labs ordered are listed, but only abnormal results are displayed) Labs Reviewed  COMPREHENSIVE METABOLIC PANEL - Abnormal; Notable for the following components:      Result Value  Potassium 5.3 (*)    CO2 7 (*)    Glucose, Bld 498 (*)    BUN 28 (*)    Alkaline Phosphatase 160 (*)    Total Bilirubin 1.5 (*)    Anion gap 27 (*)    All other components within normal limits  CBC - Abnormal; Notable for the following components:   WBC 22.5 (*)    All other components within normal limits  URINALYSIS, ROUTINE W REFLEX MICROSCOPIC - Abnormal; Notable for the following components:   Glucose, UA >=500 (*)    Hgb urine dipstick SMALL (*)    Ketones, ur 80 (*)    Protein, ur 30 (*)    All other components within normal limits  HEMOGLOBIN A1C - Abnormal; Notable for the following components:   Hgb A1c MFr Bld 11.9 (*)    All other components within normal limits  PHOSPHORUS - Abnormal; Notable for the following components:   Phosphorus 6.0 (*)    All other components within normal limits  GLUCOSE, CAPILLARY - Abnormal; Notable for the following components:   Glucose-Capillary 332 (*)    All other components within normal limits  GLUCOSE, CAPILLARY - Abnormal; Notable for the following components:   Glucose-Capillary 326 (*)    All  other components within normal limits  GLUCOSE, CAPILLARY - Abnormal; Notable for the following components:   Glucose-Capillary 270 (*)    All other components within normal limits  CBG MONITORING, ED - Abnormal; Notable for the following components:   Glucose-Capillary 589 (*)    All other components within normal limits  I-STAT CHEM 8, ED - Abnormal; Notable for the following components:   BUN 27 (*)    Creatinine, Ser 0.60 (*)    Glucose, Bld 505 (*)    TCO2 10 (*)    Hemoglobin 17.7 (*)    All other components within normal limits  CBG MONITORING, ED - Abnormal; Notable for the following components:   Glucose-Capillary 499 (*)    All other components within normal limits  CBG MONITORING, ED - Abnormal; Notable for the following components:   Glucose-Capillary 460 (*)    All other components within normal limits  MRSA PCR SCREENING  LIPASE, BLOOD  MAGNESIUM  BASIC METABOLIC PANEL  BASIC METABOLIC PANEL  BASIC METABOLIC PANEL  BASIC METABOLIC PANEL    EKG None  Radiology US Abdomen Limited Ruq  Result Date: 02/03/2018 CLINICAL DATA:  Nausea and vomiting.  History of diabetes. EXAM: ULTRASOUND ABDOMEN LIMITED RIGHT UPPER QUADRANT COMPARISON:  None in PACs FINDINGS: Gallbladder: No gallstones or wall thickening visualized. No sonographic Murphy sign noted by sonographer. Common bile duct: Diameter: 2.4 mm Liver: The hepatic echotexture is normal. The surface contour is smooth. There is no focal mass or ductal dilation. Portal vein is patent on color Doppler imaging with normal direction of blood flow towards the liver. IMPRESSION: Normal right upper quadrant abdominal ultrasound. Electronically Signed   By: David  Martinique M.D.   On: 02/03/2018 12:03    Procedures .Critical Care Performed by: Franchot Heidelberg, PA-C Authorized by: Franchot Heidelberg, PA-C   Critical care provider statement:    Critical care time (minutes):  30   Critical care time was exclusive of:   Separately billable procedures and treating other patients and teaching time   Critical care was necessary to treat or prevent imminent or life-threatening deterioration of the following conditions:  Endocrine crisis   Critical care was time spent personally by me on the following  activities:  Development of treatment plan with patient or surrogate, discussions with consultants, evaluation of patient's response to treatment, examination of patient, review of old charts, re-evaluation of patient's condition, pulse oximetry, ordering and review of radiographic studies, ordering and review of laboratory studies and ordering and performing treatments and interventions   I assumed direction of critical care for this patient from another provider in my specialty: no   Comments:     Pt presenting in DKA. Fluids started immediately, insulin ggt started upon K value.    (including critical care time)  Medications Ordered in ED Medications  0.9 %  sodium chloride infusion ( Intravenous Stopped 02/03/18 1217)  0.9 %  sodium chloride infusion ( Intravenous New Bag/Given 02/03/18 1227)  dextrose 5 %-0.45 % sodium chloride infusion (has no administration in time range)  insulin regular (NOVOLIN R,HUMULIN R) 100 Units in sodium chloride 0.9 % 100 mL (1 Units/mL) infusion (6.3 Units/hr Intravenous Rate/Dose Change 02/03/18 1458)  potassium chloride 10 mEq in 100 mL IVPB (has no administration in time range)  enoxaparin (LOVENOX) injection 40 mg (has no administration in time range)  acetaminophen (TYLENOL) tablet 1,000 mg (has no administration in time range)  atomoxetine (STRATTERA) capsule 80 mg (80 mg Oral Given 02/03/18 1426)  divalproex (DEPAKOTE ER) 24 hr tablet 500 mg (500 mg Oral Given 02/03/18 1426)  fluticasone (FLONASE) 50 MCG/ACT nasal spray 2 spray (2 sprays Each Nare Given 02/03/18 1429)  ziprasidone (GEODON) capsule 160 mg (has no administration in time range)  ondansetron (ZOFRAN) injection 4 mg (4 mg  Intravenous Given 02/03/18 1403)  metoprolol tartrate (LOPRESSOR) injection 5 mg (has no administration in time range)  sodium chloride 0.9 % bolus 1,000 mL (0 mLs Intravenous Stopped 02/03/18 0938)  ondansetron (ZOFRAN) injection 4 mg (4 mg Intravenous Given 02/03/18 0851)     Initial Impression / Assessment and Plan / ED Course  I have reviewed the triage vital signs and the nursing notes.  Pertinent labs & imaging results that were available during my care of the patient were reviewed by me and considered in my medical decision making (see chart for details).     Patient presenting for evaluation of nausea, vomiting and abdominal pain.  Physical exam shows patient who appears dehydrated and uncomfortable due to pain and vomiting.  Dylan Butler states patient appeared this way when he was in DKA last time.  Will start fluids, obtain labs, and reassess.  Patient blood sugar elevated at 589.  Labs show potassium is stable.  Will start insulin drip.  White count elevated at 22.5.  Bili and alk phos elevated baseline.  Concern for gallbladder pathology. Will order RUQ [place].  Patient with elevated anion gap at 27.  Ketones in the urine.  CBG improved with liter of fluid, now at 499.  Discussed with hospitalist (Dr. Zigmund Daniel), patient to be admitted to stepdown unit for DKA.   Final Clinical Impressions(s) / ED Diagnoses   Final diagnoses:  N&V (nausea and vomiting)  Diabetic ketoacidosis without coma associated with type 2 diabetes mellitus [facility])    ED Discharge Orders    None       Franchot Heidelberg, PA-C 02/03/18 Jenkins, Adylee Leonardo, PA-C 02/03/18 Malcom, [place], DO 02/04/18 1309

## 2018-02-04 DIAGNOSIS — R112 Nausea with vomiting, unspecified: Secondary | ICD-10-CM

## 2018-02-04 LAB — BASIC METABOLIC PANEL
ANION GAP: 13 (ref 5–15)
ANION GAP: 10 (ref 5–15)
Anion gap: 9 (ref 5–15)
BUN: 14 mg/dL (ref 6–20)
BUN: 12 mg/dL (ref 6–20)
BUN: 12 mg/dL (ref 6–20)
CALCIUM: 8.1 mg/dL — AB (ref 8.9–10.3)
CALCIUM: 8.5 mg/dL — AB (ref 8.9–10.3)
CO2: 16 mmol/L — ABNORMAL LOW (ref 22–32)
CO2: 16 mmol/L — AB (ref 22–32)
CO2: 22 mmol/L (ref 22–32)
Calcium: 8 mg/dL — ABNORMAL LOW (ref 8.9–10.3)
Chloride: 111 mmol/L (ref 101–111)
Chloride: 110 mmol/L (ref 101–111)
Chloride: 106 mmol/L (ref 101–111)
Creatinine, Ser: 0.72 mg/dL (ref 0.61–1.24)
Creatinine, Ser: 0.76 mg/dL (ref 0.61–1.24)
Creatinine, Ser: 0.81 mg/dL (ref 0.61–1.24)
GFR calc Af Amer: >60 mL/min (ref >60)
GFR calc Af Amer: >60 mL/min (ref >60)
GFR calc non Af Amer: >60 mL/min (ref >60)
GLUCOSE: 122 mg/dL — AB (ref 65–99)
GLUCOSE: 141 mg/dL — AB (ref 65–99)
GLUCOSE: 290 mg/dL — AB (ref 65–99)
POTASSIUM: 3.1 mmol/L — AB (ref 3.5–5.1)
POTASSIUM: 3.5 mmol/L (ref 3.5–5.1)
POTASSIUM: 4.1 mmol/L (ref 3.5–5.1)
SODIUM: 139 mmol/L (ref 135–145)
Sodium: 137 mmol/L (ref 135–145)
Sodium: 137 mmol/L (ref 135–145)

## 2018-02-04 LAB — COMPREHENSIVE METABOLIC PANEL
ALBUMIN: 3.3 g/dL — AB (ref 3.5–5.0)
ALT: 13 U/L — AB (ref 17–63)
AST: 12 U/L — AB (ref 15–41)
Alkaline Phosphatase: 84 U/L (ref 38–126)
Anion gap: 9 (ref 5–15)
BILIRUBIN TOTAL: 0.8 mg/dL (ref 0.3–1.2)
BUN: 14 mg/dL (ref 6–20)
CHLORIDE: 111 mmol/L (ref 101–111)
CO2: 17 mmol/L — ABNORMAL LOW (ref 22–32)
CREATININE: 0.74 mg/dL (ref 0.61–1.24)
Calcium: 7.9 mg/dL — ABNORMAL LOW (ref 8.9–10.3)
GFR calc Af Amer: >60 mL/min (ref >60)
GLUCOSE: 142 mg/dL — AB (ref 65–99)
Potassium: 3.5 mmol/L (ref 3.5–5.1)
Sodium: 137 mmol/L (ref 135–145)
Total Protein: 6 g/dL — ABNORMAL LOW (ref 6.5–8.1)

## 2018-02-04 LAB — GLUCOSE, CAPILLARY
GLUCOSE-CAPILLARY: 135 mg/dL — AB (ref 65–99)
GLUCOSE-CAPILLARY: 93 mg/dL (ref 65–99)
GLUCOSE-CAPILLARY: 152 mg/dL — AB (ref 65–99)
GLUCOSE-CAPILLARY: 153 mg/dL — AB (ref 65–99)
GLUCOSE-CAPILLARY: 123 mg/dL — AB (ref 65–99)
Glucose-Capillary: 103 mg/dL — ABNORMAL HIGH (ref 65–99)
Glucose-Capillary: 115 mg/dL — ABNORMAL HIGH (ref 65–99)
Glucose-Capillary: 170 mg/dL — ABNORMAL HIGH (ref 65–99)
Glucose-Capillary: 172 mg/dL — ABNORMAL HIGH (ref 65–99)
Glucose-Capillary: 201 mg/dL — ABNORMAL HIGH (ref 65–99)
Glucose-Capillary: 241 mg/dL — ABNORMAL HIGH (ref 65–99)
Glucose-Capillary: 261 mg/dL — ABNORMAL HIGH (ref 65–99)
Glucose-Capillary: 90 mg/dL (ref 65–99)

## 2018-02-04 LAB — VALPROIC ACID LEVEL: Valproic Acid Lvl: 16 ug/mL — ABNORMAL LOW (ref 50.0–100.0)

## 2018-02-04 LAB — CBC
HEMATOCRIT: 38.6 % — AB (ref 39.0–52.0)
Hemoglobin: 12.7 g/dL — ABNORMAL LOW (ref 13.0–17.0)
MCH: 27.1 pg (ref 26.0–34.0)
MCHC: 32.9 g/dL (ref 30.0–36.0)
MCV: 82.3 fL (ref 78.0–100.0)
PLATELETS: 222 10*3/uL (ref 150–400)
RBC: 4.69 MIL/uL (ref 4.22–5.81)
RDW: 13.3 % (ref 11.5–15.5)
WBC: 15.9 10*3/uL — AB (ref 4.0–10.5)

## 2018-02-04 MED ORDER — POTASSIUM CHLORIDE CRYS ER 20 MEQ PO TBCR
40.0000 meq | EXTENDED_RELEASE_TABLET | Freq: Two times a day (BID) | ORAL | Status: AC
  Administered 2018-02-04 (×2): 40 meq via ORAL
  Filled 2018-02-04 (×2): qty 2

## 2018-02-04 MED ORDER — INSULIN PUMP
SUBCUTANEOUS | Status: DC
  Administered 2018-02-04 (×4): via SUBCUTANEOUS
  Administered 2018-02-05: 2.3 via SUBCUTANEOUS
  Administered 2018-02-05 (×2): via SUBCUTANEOUS
  Administered 2018-02-06: 0.3 via SUBCUTANEOUS
  Filled 2018-02-04: qty 1

## 2018-02-04 MED ORDER — SODIUM CHLORIDE 0.9 % IV SOLN
INTRAVENOUS | Status: DC
  Administered 2018-02-04 – 2018-02-07 (×6): via INTRAVENOUS

## 2018-02-04 NOTE — Plan of Care (Signed)
Problem: Education: Goal: Knowledge of General Education information will improve Outcome: Progressing   Problem: Elimination: Goal: Will not experience complications related to urinary retention Outcome: Progressing   Problem: Pain Managment: Goal: General experience of comfort will improve Outcome: Progressing   Problem: Safety: Goal: Ability to remain free from injury will improve Outcome: Progressing   

## 2018-02-04 NOTE — Progress Notes (Signed)
Insulin pump connected and Insulin gtt discontinued per MD order. Pts mother to manage insulin pump d/t pt mentality.  MD aware.  Pt eating.  No c/o pain or nausea.  Dylan AlandMaura S Barclay Lennox, RN

## 2018-02-04 NOTE — Progress Notes (Signed)
Received patient form ICU, agree with previous RN assessment, VS obtained, Telemetry monitor applied, oriented to unit, call light in reach

## 2018-02-04 NOTE — Progress Notes (Signed)
PROGRESS NOTE    Dylan Butler  BBC:[id number] DOB: [birth year] DOA: 02/03/2018 PCP: System, Provider Not In    Brief Narrative:  Dylan Butler is a 22 y.o. male with medical history significant of type 1 diabetes history of recurrent DKA, mental retardation mother reports he has a mind of a 75 year old, ADHD, anxiety, schizophernia, bipolar disorder admitted with intractable nausea and vomiting. He was sfound to be in DKA. He was admitted to step down, was on insulin gtt. His cbgs over the last 8 hours have been well below 200. IV insulin stopped and his insulin pump restarted.  His A1c is 11.  No source of infection found.    Assessment & Plan:   Active Problems:   DKA (diabetic ketoacidoses) ([place])   DKA: Improved, his AG is closed and his bicarb is 16, still on IV fluids NS at 125 ml/hr.  Repeat BMP this evening to see if bicarb improved.  Start him on carb modified diet.  A1c is 11.  Diabetes education.     Nausea, vomiting resolved.   History of ADHD/bipolar disorder Resume home medications.   Elevated liver function panel with mild elevation in alk phos Unclear etiology ultrasound of the abdomen does not show any acute abnormality. Repeat alk phos within normal limits and total bili within normal limits   Hypokalemia replaced and repeat in a.m.   Tachycardia Suspect probably from volume depletion resume IV fluids and use beta-blocker.  Patient asymptomatic.    Leukocytosis So far no source of infection found.  Patient is afebrile.  No indication of starting antibiotics.  Leukocytosis probably reactive.    DVT prophylaxis: (Lovenox. Code Status: full code.  Family Communication: spoke to mom over the phone.  Disposition Plan:home when cbgs are better controlled and tachycardia improves. .   Consultants:   None  Procedures: Ultrasound abdomen Antimicrobials: None  Subjective: No new complaints of chest pain, shortness of breath, nausea or  vomiting.  Objective: Vitals:   02/04/18 0808 02/04/18 1049 02/04/18 1138 02/04/18 1200  BP: 117/73 120/71  (!) 144/48  Pulse: (!) 115 (!) 128  (!) 118  Resp: (!) 21 19  18   Temp:   99.1 F (37.3 C)   TempSrc:   Oral   SpO2: 98% 99%  99%  Weight:      Height:        Intake/Output Summary (Last 24 hours) at 02/04/2018 1231 Last data filed at 02/04/2018 1214 Gross per 24 hour  Intake 5949.83 ml  Output 2200 ml  Net 3749.83 ml   Filed Weights   02/03/18 0854 02/03/18 1217 02/04/18 0355  Weight: 65.8 kg (145 lb) 66.3 kg (146 lb 2.6 oz) 70.7 kg (155 lb 13.8 oz)    Examination:  General exam: Appears calm and comfortable  Respiratory system: Clear to auscultation. Respiratory effort normal. Cardiovascular system: S1 & S2 heard,tachycardic. No JVD, murmurs, rubs, gallops or clicks. No pedal edema. Gastrointestinal system: Abdomen is nondistended, soft and nontender. No organomegaly or masses felt. Normal bowel sounds heard. Central nervous system: Alert and but didn't answer any questions.  Extremities: Symmetric 5 x 5 power. Skin: No rashes, lesions or ulcers Psychiatry:  Mood & affect appropriate.     Data Reviewed: I have personally reviewed following labs and imaging studies  CBC: Recent Labs  Lab 02/03/18 0954 02/03/18 1001 02/04/18 0037  WBC 22.5*  --  15.9*  HGB 15.6 17.7* 12.7*  HCT 46.8 52.0 38.6*  MCV 83.6  --  82.3  PLT 303  --  657   Basic Metabolic Panel: Recent Labs  Lab 02/03/18 0954  02/03/18 1259 02/03/18 1638 02/03/18 2039 02/04/18 0037 02/04/18 0509  NA 137   < > 141 145 141 137  137 139  K 5.3*   < > 4.8 4.2 4.0 3.5  3.5 3.1*  CL 103   < > 111 118* 115* 111  111 110  CO2 7*  --  7* 11* 13* 17*  16* 16*  GLUCOSE 498*   < > 338* 181* 177* 142*  141* 122*  BUN 28*   < > 27* 22* 18 14  14 12   CREATININE 1.07   < > 1.12 1.07 0.91 0.74  0.72 0.76  CALCIUM 9.2  --  8.6* 8.4* 8.1* 7.9*  8.0* 8.1*  MG 2.1  --   --   --   --   --   --    PHOS 6.0*  --   --   --   --   --   --    < > = values in this interval not displayed.   GFR: Estimated Creatinine Clearance: 127.1 mL/min (by C-G formula based on SCr of 0.76 mg/dL). Liver Function Tests: Recent Labs  Lab 02/03/18 0954 02/04/18 0037  AST 21 12*  ALT 21 13*  ALKPHOS 160* 84  BILITOT 1.5* 0.8  PROT 7.8 6.0*  ALBUMIN 4.4 3.3*   Recent Labs  Lab 02/03/18 0954  LIPASE 16   No results for input(s): AMMONIA in the last 168 hours. Coagulation Profile: No results for input(s): INR, PROTIME in the last 168 hours. Cardiac Enzymes: No results for input(s): CKTOTAL, CKMB, CKMBINDEX, TROPONINI in the last 168 hours. BNP (last 3 results) No results for input(s): PROBNP in the last 8760 hours. HbA1C: Recent Labs    02/03/18 0954  HGBA1C 11.9*   CBG: Recent Labs  Lab 02/04/18 0639 02/04/18 0743 02/04/18 0839 02/04/18 0932 02/04/18 1108  GLUCAP 170* 103* 123* 172* 201*   Lipid Profile: No results for input(s): CHOL, HDL, LDLCALC, TRIG, CHOLHDL, LDLDIRECT in the last 72 hours. Thyroid Function Tests: No results for input(s): TSH, T4TOTAL, FREET4, T3FREE, THYROIDAB in the last 72 hours. Anemia Panel: No results for input(s): VITAMINB12, FOLATE, FERRITIN, TIBC, IRON, RETICCTPCT in the last 72 hours. Sepsis Labs: No results for input(s): PROCALCITON, LATICACIDVEN in the last 168 hours.  Recent Results (from the past 240 hour(s))  MRSA PCR Screening     Status: None   Collection Time: 02/03/18  1:30 PM  Result Value Ref Range Status   MRSA by PCR NEGATIVE NEGATIVE Final    Comment:        The GeneXpert MRSA Assay (FDA approved for NASAL specimens only), is one component of a comprehensive MRSA colonization surveillance program. It is not intended to diagnose MRSA infection nor to guide or monitor treatment for MRSA infections. Performed at [facility], [place] [address] [place], [place] [address]          Radiology  Studies: US Abdomen Limited Ruq  Result Date: 02/03/2018 CLINICAL DATA:  Nausea and vomiting.  History of diabetes. EXAM: ULTRASOUND ABDOMEN LIMITED RIGHT UPPER QUADRANT COMPARISON:  None in PACs FINDINGS: Gallbladder: No gallstones or wall thickening visualized. No sonographic Murphy sign noted by sonographer. Common bile duct: Diameter: 2.4 mm Liver: The hepatic echotexture is normal. The surface contour is smooth. There is no focal mass or ductal dilation. Portal vein is patent on color Doppler  imaging with normal direction of blood flow towards the liver. IMPRESSION: Normal right upper quadrant abdominal ultrasound. Electronically Signed   By: David  Martinique M.D.   On: 02/03/2018 12:03        Scheduled Meds: . atomoxetine  80 mg Oral Daily  . divalproex  500 mg Oral Daily  . enoxaparin (LOVENOX) injection  40 mg Subcutaneous Q24H  . fluticasone  2 spray Each Nare Daily  . insulin pump   Subcutaneous Q4H  . metoprolol tartrate  25 mg Oral BID  . potassium chloride  40 mEq Oral BID  . ziprasidone  160 mg Oral QHS   Continuous Infusions: . sodium chloride Stopped (02/03/18 1710)  . sodium chloride 125 mL/hr at 02/04/18 0954     LOS: 1 day    Time spent: 35 minutes.     Hosie Poisson, MD [facility] Pager [phone]  If 7PM-7AM, please contact night-coverage [email or web address] Password [facility] 02/04/2018, 12:31 PM

## 2018-02-04 NOTE — Progress Notes (Signed)
Report given to RiverviewKimberly, Charity fundraiserN.  Sundra AlandMaura S Sacheen Arrasmith, RN

## 2018-02-05 ENCOUNTER — Inpatient Hospital Stay (HOSPITAL_COMMUNITY): Payer: Medicaid Other

## 2018-02-05 DIAGNOSIS — R29898 Other symptoms and signs involving the musculoskeletal system: Secondary | ICD-10-CM

## 2018-02-05 DIAGNOSIS — F333 Major depressive disorder, recurrent, severe with psychotic symptoms: Secondary | ICD-10-CM

## 2018-02-05 DIAGNOSIS — R6250 Unspecified lack of expected normal physiological development in childhood: Secondary | ICD-10-CM | POA: Diagnosis present

## 2018-02-05 LAB — CBC
HEMATOCRIT: 37.1 % — AB (ref 39.0–52.0)
HEMOGLOBIN: 12.2 g/dL — AB (ref 13.0–17.0)
MCH: 26.4 pg (ref 26.0–34.0)
MCHC: 32.9 g/dL (ref 30.0–36.0)
MCV: 80.3 fL (ref 78.0–100.0)
Platelets: 228 10*3/uL (ref 150–400)
RBC: 4.62 MIL/uL (ref 4.22–5.81)
RDW: 13.5 % (ref 11.5–15.5)
WBC: 6 10*3/uL (ref 4.0–10.5)

## 2018-02-05 LAB — BASIC METABOLIC PANEL
Anion gap: 6 (ref 5–15)
BUN: 8 mg/dL (ref 6–20)
CHLORIDE: 111 mmol/L (ref 101–111)
CO2: 25 mmol/L (ref 22–32)
CREATININE: 0.56 mg/dL — AB (ref 0.61–1.24)
Calcium: 8.8 mg/dL — ABNORMAL LOW (ref 8.9–10.3)
GFR calc non Af Amer: >60 mL/min (ref >60)
Glucose, Bld: 140 mg/dL — ABNORMAL HIGH (ref 65–99)
Potassium: 3.7 mmol/L (ref 3.5–5.1)
Sodium: 142 mmol/L (ref 135–145)

## 2018-02-05 LAB — GLUCOSE, CAPILLARY
GLUCOSE-CAPILLARY: 112 mg/dL — AB (ref 65–99)
GLUCOSE-CAPILLARY: 115 mg/dL — AB (ref 65–99)
GLUCOSE-CAPILLARY: 199 mg/dL — AB (ref 65–99)
GLUCOSE-CAPILLARY: 234 mg/dL — AB (ref 65–99)
Glucose-Capillary: 164 mg/dL — ABNORMAL HIGH (ref 65–99)
Glucose-Capillary: 182 mg/dL — ABNORMAL HIGH (ref 65–99)
Glucose-Capillary: 245 mg/dL — ABNORMAL HIGH (ref 65–99)
Glucose-Capillary: 197 mg/dL — ABNORMAL HIGH (ref 65–99)

## 2018-02-05 NOTE — Progress Notes (Signed)
   02/05/18 1200  Clinical Encounter Type  Visited With Patient;Health care provider  Visit Type Follow-up   Following up from visit with the patient's mom this morning.  Patient was in bed healthcare provider finishing up.  Patient welcomed me in and was very talkative and able to tell me what happened this morning.  He says he is doing better and his mom had gone downstairs for a bit.  We visited for a few minutes.  Will check back on the mom later this afternoon. Chaplain Agustin CreeNewton Dangelo Guzzetta

## 2018-02-05 NOTE — Consult Note (Signed)
Date: 02/05/2018  [facility] TeleNeurology Consult Services  Impression:  Acute Ischemic Stroke - right hemispheric.  Etiology unknown but given history of Down syndrome, cardiogenic source is on top of differential   Not a tpa candidate due to: NIHSS 2 for nondisabling symptoms  Does not meet LVO screening criteria (no aphasia, neglect, gaze deviation, dense hemiparesis, or visual field deficits on exam), therefore advanced imaging is not indicated.   Differential Diagnosis:   1. Cardioembolic stroke  2. Small vessel disease/lacune  3. Thromboembolic, artery-to-artery mechanism  4. Hypercoagulable state-related infarct  5. Transient ischemic attack  6. Thrombotic mechanism, large artery disease   Comments: Door Time: N/A (inpatient) [facility] contacted:  [phone] [facility] at bedside: 845 NIHSS assessment start time (time the consultation begins):849 Last known well time (LKW): 500  Recommendations:  1. Admit to stroke/telemetry 2. Head of bed flat 3. IV Fluids, NS 4. Aspirin if no contraindications 5. DVT prophylaxis 6. Dysphagia screen 7. Neuro checks 8. MRI brain wo to rule out stroke 8. In house neurology consult.  9. Further stroke workup per in house neurology/internal medicine  Discussed with ED attending.  Please call [facility] if you have any questions: [phone]   -----------------------------------------------------------------------------------------  CC Facial droop, slurred speech  History of Present Illness   Patient is a  22 year old man with history of Down syndrome who presents with left sided facial droop and slurred speech.  He was last seen normal at 5 AM by his mother.  His mother noticed slurred speech at 6:15 AM and then noticed th facial droop.  Diagnostic: CT head reviewed: no acute intracranial abnormality.  Exam: Patient is in no apparent distress. Patient appears as stated age. No obvious acute respiratory or  cardiac distress. Patient is well groomed and well-nourished.  1a- LOC: Keenly responsive - 0  1b- LOC questions: Answers both questions correctly - 0  1c- LOC commands- Performs both tasks correctly- 0  2- Gaze: Normal; no gaze paresis or gaze deviation - 0  3- Visual Fields: 0  4- Facial movements: 1 5- Upper limb motor - 0  6- Lower limb motor - 0  7- Limb Coordination: absent ataxia - 0  8- Sensory: 0 9- Language - 0  10- Speech - 1 11- Neglect - none found - 0  NIHSS score: 2   Medical Decision Making:  - Extensive number of diagnosis or management options are considered above. - Extensive amount of complex data reviewed. - High risk of complication and/or morbidity or mortality are associated with differential diagnostic considerations above.  - There may be Uncertain outcome and increased probability of prolonged functional impairment or high probability of severe prolonged functional impairment associated with some of these differential diagnosis.  Medical Data Reviewed:  1.Data reviewed include clinical labs, radiology,Medical Tests; 2.Tests results discussed w/performing or interpreting physician; 3.Obtaining/reviewing old medical records;  4.Obtaining case history from another source;  5.Independent review of image, tracing or specimen.   Patient was informed the Neurology Consult would happen via telehealth (remote video) and consented to receiving care in this manner.

## 2018-02-05 NOTE — Progress Notes (Signed)
At approximately [id number], patient's mother notified this RN that patient has slurred speech and his "face is drooping." Upon assessment, patient was A&O, VSS, patient c/o left face pain with facial drooping noticeable when face was relaxed. Left hand/arm was also slightly weaker than left side, but pt also c/o the IV tape being too tight in left wrist. Rapid response called and MD notified. MD gave VO for CT head wo contrast, Code Stroke called by RR RN. Mother at bedside anda ware of updates.

## 2018-02-05 NOTE — Significant Event (Signed)
Rapid Response Event Note  Overview: Time Called: 0800 Arrival Time: 0804 Event Type: Neurologic Bedside RN Yvonna AlanisKaitlyn called rapid response in regards to patient having difficulty with speech and mother noticing left sided facial droop as well. Last known time well was 0500 per mother. Mother started noticing patient having increase in slurring of words at [id number]. And after patient having breakfast facial droop was reported at 0750 to bedside RN. Rapid Response called at 0800. Upon arrival, patient was utilizing restroom. Patient was able to walk from bathroom to bed with no difficulty.  Initial Focused Assessment: Neuro: Pupils were 2+, reactive to light, and round bilaterally. Patient was able to follow all commands asked. Patient did have a slight droop on left side when smile was at rest. Patient was complaining of left sided cheek pain. Patient was able to squeeze hands and left side was weaker than right side per upper extremities. Lower extremities were equal in strength. VAN assessment negative.  Cardiac: NSR, s1 and s2 heart sounds heard upon auscultation, 2+ pulses palpated in radial an pedal locations bilaterally. BP see vital signs. Pulmonary: Breath sounds clear and diminished in all lung fields. RR 19-27, breathing unlabored. O2 Sats 97-100%.   Interventions: Code Stroke Called 0824 CBG already obtained prior to Rapid response call.  STAT CT obtained, Tele Neuro activated  STAT MRI after CT scan  Transfer to SDU for closer monitoring  Plan of Care (if not transferred):       Layman Gully C

## 2018-02-05 NOTE — Plan of Care (Signed)
  Problem: Education: Goal: Knowledge of General Education information will improve Outcome: Progressing   Problem: Health Behavior/Discharge Planning: Goal: Ability to manage health-related needs will improve Outcome: Progressing   Problem: Clinical Measurements: Goal: Ability to maintain clinical measurements within normal limits will improve Outcome: Progressing Goal: Will remain free from infection Outcome: Progressing Goal: Diagnostic test results will improve Outcome: Progressing Goal: Respiratory complications will improve Outcome: Not Applicable Goal: Cardiovascular complication will be avoided Outcome: Progressing   Problem: Nutrition: Goal: Adequate nutrition will be maintained Outcome: Not Applicable

## 2018-02-05 NOTE — Progress Notes (Signed)
   02/05/18 1500  Clinical Encounter Type  Visited With Patient and family together  Visit Type Follow-up  Spiritual Encounters  Spiritual Needs Prayer  Stress Factors  Family Stress Factors Financial concerns   Followed back up on this patient as the mother was now bedside.  Mom indicated she is waiting on test results.  Stated that her husband figured out some help with food for her and he had brought her some clothes but I was able to get her some tee shirts.  Mom was very Adult nurseappreciative.  Will have Chaplain Varner follow up with them tomorrow.  Will follow and support as needed. Chaplain Agustin CreeNewton Bana Borgmeyer

## 2018-02-05 NOTE — Progress Notes (Signed)
   02/05/18 1000  Clinical Encounter Type  Visited With Family;Health care provider  Visit Type Initial  Spiritual Encounters  Spiritual Needs Emotional;Prayer  Stress Factors  Family Stress Factors Major life changes   While rounding on 4th floor encountered the mother of the patient just sitting in the room.  Stopped to check on her.  She indicated her son seemed to have an episode this morning that required immediate medical attention.  Spoke with Nurse and she indicated patient will move to 2nd floor.  Supported the mother and offered hospitality.  Will follow and support today when patient gets settled on 2nd floor.  Prayed with the mother. Chaplain Agustin CreeNewton Ramar Nobrega

## 2018-02-05 NOTE — Progress Notes (Signed)
Patient ID: Dylan Butler, male   DOB: 12/15/1995, 22 y.o.   MRN: [id number]  PROGRESS NOTE    Dylan Butler  EXB:[id number] DOB: [birth year] DOA: 02/03/2018 PCP: System, Provider Not In   Outpatient Specialists: None   Brief Narrative: patient is 22 year old gentleman with type 1 diabetes as well as mental retardation, bipolar disorder and schizophrenia who was admitted initially with diabetic ketoacidosis. Patient has been out of DKA now. He however developed acute neurologic symptoms including left upper and lower extremity weakness as well as facial droop on the morning of 02/05/2018. Echo stroke was called and patient is now being observed in the stepdown unit.  Assessment & Plan:   Active Problems:   DKA (diabetic ketoacidoses) (HCC)   Major depressive disorder, recurrent episode, severe, with psychosis (HCC)   Weakness of left upper extremity   Developmental delay disorder   #1 left upper extremity weakness: Patient has TIA type symptoms that has so far resolved. Residual weakness in the left upper extremity can be seen. Head CT without contrast so far negative.MRI of the brain also negative. Symptoms may just be related to his diabetes. At this point patient is improving. We'll continue observation. We'll consider echocardiogram and carotid Dopplers due to his significant risk factors.  #2 DKA: Patient has been out of DKA and currently on subcutaneous insulin. Continue his insulin pump.  #3 bipolar disorder: Currently on Depakote. Continue treatment.  #4 schizophrenia: Patient on Strattera and Geodon. Continue treatment  #5 hypertension: Continue metoprolol.   DVT prophylaxis: Lovenox  Code Status: Full  Family Communication: Mother at bedside  Disposition Plan: Home with Mother  Consultants:   None  Procedures: Head CT without contrast MRI of the brain  Antimicrobials: None  Subjective: Patient is currently smiling with no significant complaint. Still has some mild  upper extremity numbness and weakness  Objective: Vitals:   02/05/18 1010 02/05/18 1100 02/05/18 1140 02/05/18 1600  BP: 128/69 121/62  (!) 101/51  Pulse:  89  66  Resp:  (!) 23    Temp:   97.7 F (36.5 C) 98 F (36.7 C)  TempSrc:   Oral Oral  SpO2: 100% 100%  100%  Weight:  68.2 kg (150 lb 5.7 oz)    Height:  5\' 2"  (1.575 m)      Intake/Output Summary (Last 24 hours) at 02/05/2018 1852 Last data filed at 02/05/2018 1800 Gross per 24 hour  Intake 4257.92 ml  Output -  Net 4257.92 ml   Filed Weights   02/03/18 1217 02/04/18 0355 02/05/18 1100  Weight: 66.3 kg (146 lb 2.6 oz) 70.7 kg (155 lb 13.8 oz) 68.2 kg (150 lb 5.7 oz)    Examination:  General exam: Appears calm and comfortable  Respiratory system: Clear to auscultation. Respiratory effort normal. Cardiovascular system: S1 & S2 heard, RRR. No JVD, murmurs, rubs, gallops or clicks. No pedal edema. Gastrointestinal system: Abdomen is nondistended, soft and nontender. No organomegaly or masses felt. Normal bowel sounds heard. Central nervous system: Alert and oriented. No focal neurological deficits. Extremities: Symmetric 5 x 5 power. Skin: No rashes, lesions or ulcers Psychiatry: Judgement and insight appear normal. Mood & affect appropriate.     Data Reviewed: I have personally reviewed following labs and imaging studies  CBC: Recent Labs  Lab 02/03/18 0954 02/03/18 1001 02/04/18 0037 02/05/18 0431  WBC 22.5*  --  15.9* 6.0  HGB 15.6 17.7* 12.7* 12.2*  HCT 46.8 52.0 38.6* 37.1*  MCV 83.6  --  82.3  80.3  PLT 303  --  222 228   Basic Metabolic Panel: Recent Labs  Lab 02/03/18 0954  02/03/18 2039 02/04/18 0037 02/04/18 0509 02/04/18 1630 02/05/18 0431  NA 137   < > 141 137  137 139 137 142  K 5.3*   < > 4.0 3.5  3.5 3.1* 4.1 3.7  CL 103   < > 115* 111  111 110 106 111  CO2 7*   < > 13* 17*  16* 16* 22 25  GLUCOSE 498*   < > 177* 142*  141* 122* 290* 140*  BUN 28*   < > 18 14  14 12 12 8     CREATININE 1.07   < > 0.91 0.74  0.72 0.76 0.81 0.56*  CALCIUM 9.2   < > 8.1* 7.9*  8.0* 8.1* 8.5* 8.8*  MG 2.1  --   --   --   --   --   --   PHOS 6.0*  --   --   --   --   --   --    < > = values in this interval not displayed.   GFR: Estimated Creatinine Clearance: 125 mL/min (A) (by C-G formula based on SCr of 0.56 mg/dL (L)). Liver Function Tests: Recent Labs  Lab 02/03/18 0954 02/04/18 0037  AST 21 12*  ALT 21 13*  ALKPHOS 160* 84  BILITOT 1.5* 0.8  PROT 7.8 6.0*  ALBUMIN 4.4 3.3*   Recent Labs  Lab 02/03/18 0954  LIPASE 16   No results for input(s): AMMONIA in the last 168 hours. Coagulation Profile: No results for input(s): INR, PROTIME in the last 168 hours. Cardiac Enzymes: No results for input(s): CKTOTAL, CKMB, CKMBINDEX, TROPONINI in the last 168 hours. BNP (last 3 results) No results for input(s): PROBNP in the last 8760 hours. HbA1C: Recent Labs    02/03/18 0954  HGBA1C 11.9*   CBG: Recent Labs  Lab 02/05/18 0028 02/05/18 0440 02/05/18 0746 02/05/18 1132 02/05/18 1716  GLUCAP 234* 115* 199* 182* 245*   Lipid Profile: No results for input(s): CHOL, HDL, LDLCALC, TRIG, CHOLHDL, LDLDIRECT in the last 72 hours. Thyroid Function Tests: No results for input(s): TSH, T4TOTAL, FREET4, T3FREE, THYROIDAB in the last 72 hours. Anemia Panel: No results for input(s): VITAMINB12, FOLATE, FERRITIN, TIBC, IRON, RETICCTPCT in the last 72 hours. Urine analysis:    Component Value Date/Time   COLORURINE YELLOW 02/03/2018 0819   APPEARANCEUR CLEAR 02/03/2018 0819   LABSPEC 1.023 02/03/2018 0819   PHURINE 5.0 02/03/2018 0819   GLUCOSEU >=500 (A) 02/03/2018 0819   HGBUR SMALL (A) 02/03/2018 0819   BILIRUBINUR NEGATIVE 02/03/2018 0819   BILIRUBINUR negative 12/06/2016 1453   KETONESUR 80 (A) 02/03/2018 0819   PROTEINUR 30 (A) 02/03/2018 0819   UROBILINOGEN 0.2 12/06/2016 1453   NITRITE NEGATIVE 02/03/2018 0819   LEUKOCYTESUR NEGATIVE 02/03/2018 0819    Sepsis Labs: @LABRCNTIP (procalcitonin:4,lacticidven:4)  ) Recent Results (from the past 240 hour(s))  MRSA PCR Screening     Status: None   Collection Time: 02/03/18  1:30 PM  Result Value Ref Range Status   MRSA by PCR NEGATIVE NEGATIVE Final    Comment:        The GeneXpert MRSA Assay (FDA approved for NASAL specimens only), is one component of a comprehensive MRSA colonization surveillance program. It is not intended to diagnose MRSA infection nor to guide or monitor treatment for MRSA infections. Performed at [facility], [address]  [place], [place] [address]          Radiology Studies: Mr Brain Wo Contrast  Result Date: 02/05/2018 CLINICAL DATA:  Slurred speech and left-sided facial droop beginning a few hours ago. EXAM: MRI HEAD WITHOUT CONTRAST TECHNIQUE: Multiplanar, multiecho pulse sequences of the brain and surrounding structures were obtained without intravenous contrast. COMPARISON:  CT same day. FINDINGS: Brain: Brain has normal appearance without evidence of malformation, atrophy, old or acute small or large vessel infarction, mass lesion, hemorrhage, hydrocephalus or extra-axial collection. Single exception to this is a punctate focus of T2 and FLAIR signal in the right frontal white matter. As an isolated finding, this is not likely significant. Vascular: Major vessels at the base of the brain show flow. Venous sinuses appear patent. Skull and upper cervical spine: Normal. Sinuses/Orbits: Clear/normal. Other: None significant. IMPRESSION: No acute or subacute insult. Normal study with exception of a single punctate focus of T2 and FLAIR signal in the right frontal white matter, not likely significant as an isolated finding. Electronically Signed   By: Paulina Fusi M.D.   On: 02/05/2018 09:49   Ct Head Code Stroke Wo Contrast`  Result Date: 02/05/2018 CLINICAL DATA:  Code stroke. 22 year old male with left facial droop and slurred speech at  0750 hours. Mild left upper extremity weakness. Down syndrome. EXAM: CT HEAD WITHOUT CONTRAST TECHNIQUE: Contiguous axial images were obtained from the base of the skull through the vertex without intravenous contrast. COMPARISON:  Head CT without contrast 05/27/2017. FINDINGS: Brain: Cerebral volume remains normal. No midline shift, ventriculomegaly, mass effect, evidence of mass lesion, intracranial hemorrhage or evidence of cortically based acute infarction. Gray-white matter differentiation is within normal limits throughout the brain. Vascular: No suspicious intracranial vascular hyperdensity. Skull: Stable and negative. Sinuses/Orbits: Visualized paranasal sinuses and mastoids are stable and well pneumatized. 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 Other: Visualized orbits and scalp soft tissues are within normal limits. ASPECTS (Alberta Stroke Program Early CT Score) - Ganglionic level infarction (caudate, lentiform nuclei, internal capsule, insula, M1-M3 cortex): 7 - Supraganglionic infarction (M4-M6 cortex): 3 Total score (0-10 with 10 being normal): 10 IMPRESSION: 1. Stable and normal noncontrast CT appearance of the brain. 2. ASPECTS is 10. 3. These results were communicated to Dr. Earlie Lou at 8:41 amon 4/3/2019by text page via the [facility] messaging system. Electronically Signed   By: Odessa Fleming M.D.   On: 02/05/2018 08:41        Scheduled Meds: . atomoxetine  80 mg Oral Daily  . divalproex  500 mg Oral Daily  . enoxaparin (LOVENOX) injection  40 mg Subcutaneous Q24H  . fluticasone  2 spray Each Nare Daily  . insulin pump   Subcutaneous Q4H  . ziprasidone  160 mg Oral QHS   Continuous Infusions: . sodium chloride Stopped (02/03/18 1710)  . sodium chloride 125 mL/hr at 02/05/18 0439     LOS: 2 days    Time spent: 38 minutes    Nidya Bouyer,LAWAL, MD [facility] Pager [phone]  If 7PM-7AM, please contact night-coverage [email or web address] Password [facility] 02/05/2018, 6:52 PM

## 2018-02-06 ENCOUNTER — Inpatient Hospital Stay (HOSPITAL_COMMUNITY): Payer: Medicaid Other

## 2018-02-06 DIAGNOSIS — G459 Transient cerebral ischemic attack, unspecified: Secondary | ICD-10-CM | POA: Diagnosis not present

## 2018-02-06 DIAGNOSIS — F209 Schizophrenia, unspecified: Secondary | ICD-10-CM | POA: Diagnosis present

## 2018-02-06 DIAGNOSIS — F319 Bipolar disorder, unspecified: Secondary | ICD-10-CM

## 2018-02-06 DIAGNOSIS — E1010 Type 1 diabetes mellitus with ketoacidosis without coma: Principal | ICD-10-CM

## 2018-02-06 DIAGNOSIS — I10 Essential (primary) hypertension: Secondary | ICD-10-CM | POA: Diagnosis present

## 2018-02-06 LAB — ANTITHROMBIN III: ANTITHROMB III FUNC: 100 % (ref 75–120)

## 2018-02-06 LAB — GLUCOSE, CAPILLARY
GLUCOSE-CAPILLARY: 342 mg/dL — AB (ref 65–99)
GLUCOSE-CAPILLARY: 269 mg/dL — AB (ref 65–99)
Glucose-Capillary: 71 mg/dL (ref 65–99)
Glucose-Capillary: 148 mg/dL — ABNORMAL HIGH (ref 65–99)
Glucose-Capillary: 289 mg/dL — ABNORMAL HIGH (ref 65–99)
Glucose-Capillary: 222 mg/dL — ABNORMAL HIGH (ref 65–99)

## 2018-02-06 LAB — BASIC METABOLIC PANEL
ANION GAP: 8 (ref 5–15)
BUN: 7 mg/dL (ref 6–20)
CALCIUM: 8.9 mg/dL (ref 8.9–10.3)
CHLORIDE: 104 mmol/L (ref 101–111)
CO2: 28 mmol/L (ref 22–32)
CREATININE: 0.72 mg/dL (ref 0.61–1.24)
GFR calc non Af Amer: >60 mL/min (ref >60)
Glucose, Bld: 139 mg/dL — ABNORMAL HIGH (ref 65–99)
Potassium: 3.5 mmol/L (ref 3.5–5.1)
SODIUM: 140 mmol/L (ref 135–145)

## 2018-02-06 LAB — ECHOCARDIOGRAM COMPLETE
HEIGHTINCHES: 62 in
WEIGHTICAEL: 2405.66 [oz_av]

## 2018-02-06 MED ORDER — STROKE: EARLY STAGES OF RECOVERY BOOK
Freq: Once | Status: AC
  Administered 2018-02-06: 12:00:00
  Filled 2018-02-06: qty 1

## 2018-02-06 MED ORDER — ASPIRIN EC 81 MG PO TBEC
81.0000 mg | DELAYED_RELEASE_TABLET | Freq: Every day | ORAL | Status: DC
  Administered 2018-02-06 – 2018-02-07 (×2): 81 mg via ORAL
  Filled 2018-02-06 (×2): qty 1

## 2018-02-06 MED ORDER — HYDRALAZINE HCL 20 MG/ML IJ SOLN: 10.0000 mg | Freq: Four times a day (QID) | INTRAMUSCULAR | Status: DC | PRN

## 2018-02-06 MED ORDER — INSULIN ASPART 100 UNIT/ML ~~LOC~~ SOLN
1.0000 [IU] | Freq: Once | SUBCUTANEOUS | Status: AC
Start: 2018-02-06 — End: 2018-02-06
  Administered 2018-02-06: 5.4 [IU] via SUBCUTANEOUS
  Filled 2018-02-06: qty 0.01

## 2018-02-06 NOTE — Progress Notes (Signed)
Report called to 3W nurse before transport. Bed is not cleaned per Charge Nurse. The bed is in for a stat clean. Bed was assigned before bed is cleaned. Was told by Charge Nurse on 3W that transport could not happen until the bed is cleaned and ICU nursing staff [facility] would be notified. [facility] has been called and is on standby for transport once bed is ready for patient.

## 2018-02-06 NOTE — Progress Notes (Signed)
PROGRESS NOTE    Dylan Butler  WUJ:[id number] DOB: [birth year] DOA: 02/03/2018 PCP: System, Provider Not In    Brief Narrative:  Patient is a 22 year old gentleman history of diabetes, bipolar disorder, schizophrenia, oppositional defiant disorder, anxiety, mental retardation who was admitted with nausea vomiting recent ear infection and noted to be in DKA.  Patient was admitted to the stepdown unit where he was placed on the glucose stabilizer, IV fluids with resolution of DKA.  Patient was subsequently transitioned back to his insulin pump. On the morning of 02/05/2018 patient was noted to have a left upper and lower extremity weakness, facial droop and slurred speech and as such a code stroke was activated.  CT which was done was negative.  MRI did not show any acute stroke however did show a single punctate focus of T2 and flair signal in the right frontal white matter, not likely significant as an isolated finding.  Patient was seen by telemetry neurology who had recommended a stroke workup and inpatient neurology consult.  Patient symptoms resolved within an hour and was close to baseline.  TPA was not administered as patient's symptoms resolved quickly.  Neurology consultation was placed on 02/06/2018 and recommendations were made for transfer to [facility] for further neurological workup.  Patient will be placed on aspirin 325 mg daily.  Fasting lipid panel pending.   Assessment & Plan:   Principal Problem:   DKA (diabetic ketoacidoses) (HCC) Active Problems:   TIA (transient ischemic attack): probable   Weakness of left upper extremity   Major depressive disorder, recurrent episode, severe, with psychosis (HCC)   Developmental delay disorder   Bipolar 1 disorder (HCC)   Schizophrenia (HCC)   HTN (hypertension)  #1 DKA Likely triggered by recent ear infection.  Patient was placed in the stepdown unit and placed on the glucose stabilizer however has been transitioned back to home insulin  pump.  Hemoglobin A1c 11.9 on 02/03/2018. CBGs 71-148.  Continue insulin pump.  Outpatient follow-up with PCP.  2.  Left-sided weakness/slurred speech/facial droop/probable TIA Patient with onset of symptoms the morning of 02/05/2018 which rapidly resolved within an hour.  CT head unremarkable.  MRI head with no significant acute abnormalities however did have a single punctate focus of T2 and flair signal in the right frontal white matter, not likely significant as an isolated finding.Patient today with some complaints of left facial numbness and still some left upper extremity weakness.  Check a fasting lipid panel, check a 2D echo, check carotid Dopplers, check a MRA of the head.  Check a hypercoagulable panel.  Placed on aspirin 81 mg daily.  If LDL not at goal of less than 70 will need to be started on a statin.  Stroke orders have been placed by neurology PA.  Neurology formally consulted my have spoken with on the telephone, recommended transfer to [facility] for further evaluation.  3.  Bipolar disorder Currently stable.  Continue home regimen Depakote.  4.  Schizophrenia Stable.  Continue Strattera and Geodon.  5.  Hypertension Blood pressure stable.  Patient was on clonidine prior to admission however has not been on clonidine for the past 2 weeks as patient is in transition with PCPs.  Permissive hypertension secondary to problem #2.  If further blood pressure control is needed will recommend starting patient on Norvasc.  Hydralazine as needed systolic blood pressure greater than 190.  6.  Developmental delay/mental retardation Stable.   DVT prophylaxis: Lovenox Code Status: Full Family Communication: Updated  mother and patient at bedside. Disposition Plan: Transfer to [facility], neuro telemetry for further evaluation by neurology.   Consultants:   Neuro telemetry 02/05/2018: Dr.Boudreau 02/05/2018  Neurology: Dr. Amada JupiterKirkpatrick pending 02/06/2018  Procedures:  CT  head 02/05/2018  MRI head 02/05/2018  Upper quadrant ultrasound 02/03/2018  Antimicrobials:   None   Subjective: Patient sitting up in bed.  Denies any chest pain.  No shortness of breath.  No further slurred speech since the morning of 02/05/2018.  Tolerating current diet.  No emesis.  Objective: Vitals:   02/06/18 0340 02/06/18 0400 02/06/18 0500 02/06/18 0800  BP:  (!) 109/54    Pulse:  65 (!) 58   Resp:  17 (!) 8   Temp: 98.2 F (36.8 C)   (!) 97.3 F (36.3 C)  TempSrc: Oral   Oral  SpO2:  99% 99%   Weight:      Height:        Intake/Output Summary (Last 24 hours) at 02/06/2018 0955 Last data filed at 02/06/2018 0800 Gross per 24 hour  Intake 4690 ml  Output 400 ml  Net 4290 ml   Filed Weights   02/03/18 1217 02/04/18 0355 02/05/18 1100  Weight: 66.3 kg (146 lb 2.6 oz) 70.7 kg (155 lb 13.8 oz) 68.2 kg (150 lb 5.7 oz)    Examination:  General exam: Appears calm and comfortable  Respiratory system: Clear to auscultation. Respiratory effort normal. Cardiovascular system: S1 & S2 heard, RRR. No JVD, murmurs, rubs, gallops or clicks. No pedal edema. Gastrointestinal system: Abdomen is nondistended, soft and nontender. No organomegaly or masses felt. Normal bowel sounds heard. Central nervous system: Alert and oriented.  Some left-sided facial numbness per patient. 4/5 left upper extremity strength.  Decreased grip strength on the left.  Extremities: 5 out of 5 left upper extremity strength.  Rest of extremities with 5/5 strength.  Skin: No rashes, lesions or ulcers Psychiatry: Judgement and insight appear poor to fair. Mood & affect appropriate.     Data Reviewed: I have personally reviewed following labs and imaging studies  CBC: Recent Labs  Lab 02/03/18 0954 02/03/18 1001 02/04/18 0037 02/05/18 0431  WBC 22.5*  --  15.9* 6.0  HGB 15.6 17.7* 12.7* 12.2*  HCT 46.8 52.0 38.6* 37.1*  MCV 83.6  --  82.3 80.3  PLT 303  --  222 228   Basic Metabolic Panel: Recent  Labs  Lab 02/03/18 0954  02/04/18 0037 02/04/18 0509 02/04/18 1630 02/05/18 0431 02/06/18 0818  NA 137   < > 137  137 139 137 142 140  K 5.3*   < > 3.5  3.5 3.1* 4.1 3.7 3.5  CL 103   < > 111  111 110 106 111 104  CO2 7*   < > 17*  16* 16* 22 25 28   GLUCOSE 498*   < > 142*  141* 122* 290* 140* 139*  BUN 28*   < > 14  14 12 12 8 7   CREATININE 1.07   < > 0.74  0.72 0.76 0.81 0.56* 0.72  CALCIUM 9.2   < > 7.9*  8.0* 8.1* 8.5* 8.8* 8.9  MG 2.1  --   --   --   --   --   --   PHOS 6.0*  --   --   --   --   --   --    < > = values in this interval not displayed.   GFR: Estimated Creatinine  Clearance: 125 mL/min (by C-G formula based on SCr of 0.72 mg/dL). Liver Function Tests: Recent Labs  Lab 02/03/18 0954 02/04/18 0037  AST 21 12*  ALT 21 13*  ALKPHOS 160* 84  BILITOT 1.5* 0.8  PROT 7.8 6.0*  ALBUMIN 4.4 3.3*   Recent Labs  Lab 02/03/18 0954  LIPASE 16   No results for input(s): AMMONIA in the last 168 hours. Coagulation Profile: No results for input(s): INR, PROTIME in the last 168 hours. Cardiac Enzymes: No results for input(s): CKTOTAL, CKMB, CKMBINDEX, TROPONINI in the last 168 hours. BNP (last 3 results) No results for input(s): PROBNP in the last 8760 hours. HbA1C: No results for input(s): HGBA1C in the last 72 hours. CBG: Recent Labs  Lab 02/05/18 1716 02/05/18 1953 02/05/18 2322 02/06/18 0338 02/06/18 0818  GLUCAP 245* 197* 112* 71 148*   Lipid Profile: No results for input(s): CHOL, HDL, LDLCALC, TRIG, CHOLHDL, LDLDIRECT in the last 72 hours. Thyroid Function Tests: No results for input(s): TSH, T4TOTAL, FREET4, T3FREE, THYROIDAB in the last 72 hours. Anemia Panel: No results for input(s): VITAMINB12, FOLATE, FERRITIN, TIBC, IRON, RETICCTPCT in the last 72 hours. Sepsis Labs: No results for input(s): PROCALCITON, LATICACIDVEN in the last 168 hours.  Recent Results (from the past 240 hour(s))  MRSA PCR Screening     Status: None    Collection Time: 02/03/18  1:30 PM  Result Value Ref Range Status   MRSA by PCR NEGATIVE NEGATIVE Final    Comment:        The GeneXpert MRSA Assay (FDA approved for NASAL specimens only), is one component of a comprehensive MRSA colonization surveillance program. It is not intended to diagnose MRSA infection nor to guide or monitor treatment for MRSA infections. Performed at [facility], [address] [place], [place] [address]          Radiology Studies: Mr Brain Wo Contrast  Result Date: 02/05/2018 CLINICAL DATA:  Slurred speech and left-sided facial droop beginning a few hours ago. EXAM: MRI HEAD WITHOUT CONTRAST TECHNIQUE: Multiplanar, multiecho pulse sequences of the brain and surrounding structures were obtained without intravenous contrast. COMPARISON:  CT same day. FINDINGS: Brain: Brain has normal appearance without evidence of malformation, atrophy, old or acute small or large vessel infarction, mass lesion, hemorrhage, hydrocephalus or extra-axial collection. Single exception to this is a punctate focus of T2 and FLAIR signal in the right frontal white matter. As an isolated finding, this is not likely significant. Vascular: Major vessels at the base of the brain show flow. Venous sinuses appear patent. Skull and upper cervical spine: Normal. Sinuses/Orbits: Clear/normal. Other: None significant. IMPRESSION: No acute or subacute insult. Normal study with exception of a single punctate focus of T2 and FLAIR signal in the right frontal white matter, not likely significant as an isolated finding. Electronically Signed   By: Paulina Fusi M.D.   On: 02/05/2018 09:49   Ct Head Code Stroke Wo Contrast`  Result Date: 02/05/2018 CLINICAL DATA:  Code stroke. 22 year old male with left facial droop and slurred speech at 0750 hours. Mild left upper extremity weakness. Down syndrome. EXAM: CT HEAD WITHOUT CONTRAST TECHNIQUE: Contiguous axial images were obtained from  the base of the skull through the vertex without intravenous contrast. COMPARISON:  Head CT without contrast 05/27/2017. FINDINGS: Brain: Cerebral volume remains normal. No midline shift, ventriculomegaly, mass effect, evidence of mass lesion, intracranial hemorrhage or evidence of cortically based acute infarction. Gray-white matter differentiation is within normal limits throughout the  brain. Vascular: No suspicious intracranial vascular hyperdensity. Skull: Stable and negative. Sinuses/Orbits: Visualized paranasal sinuses and mastoids are stable and well pneumatized. 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 Other: Visualized orbits and scalp soft tissues are within normal limits. ASPECTS (Alberta Stroke Program Early CT Score) - Ganglionic level infarction (caudate, lentiform nuclei, internal capsule, insula, M1-M3 cortex): 7 - Supraganglionic infarction (M4-M6 cortex): 3 Total score (0-10 with 10 being normal): 10 IMPRESSION: 1. Stable and normal noncontrast CT appearance of the brain. 2. ASPECTS is 10. 3. These results were communicated to Dr. Earlie Lou at 8:41 amon 4/3/2019by text page via the [facility] messaging system. Electronically Signed   By: Odessa Fleming M.D.   On: 02/05/2018 08:41        Scheduled Meds: .  stroke: mapping our early stages of recovery book   Does not apply Once  . atomoxetine  80 mg Oral Daily  . divalproex  500 mg Oral Daily  . enoxaparin (LOVENOX) injection  40 mg Subcutaneous Q24H  . fluticasone  2 spray Each Nare Daily  . insulin pump   Subcutaneous Q4H  . ziprasidone  160 mg Oral QHS   Continuous Infusions: . sodium chloride Stopped (02/03/18 1710)  . sodium chloride 125 mL/hr at 02/05/18 2237     LOS: 3 days    Time spent: 35 minutes.    Ramiro Harvest, MD [facility] Pager [phone]  If 7PM-7AM, please contact night-coverage [email or web address] Password [email or web address] 02/06/2018, 9:55 AM

## 2018-02-06 NOTE — Progress Notes (Signed)
Inpatient Diabetes Program Recommendations  AACE/ADA: New Consensus Statement on Inpatient Glycemic Control (2015)  Target Ranges:  Prepandial:   less than 140 mg/dL      Peak postprandial:   less than 180 mg/dL (1-2 hours)      Critically ill patients:  140 - 180 mg/dL   Lab Results  Component Value Date   GLUCAP 289 (H) 02/06/2018   HGBA1C 11.9 (H) 02/03/2018    Review of Glycemic Control  Blood sugars trending well. Settings have not changed since 07/2017.  Insulin pump-670 G Per note from pump trainer, initial basal rate is 1 unit per hour which provides a total of 24 units in a 24 hour period.  The CHO ratio is 1 unit for every 10 grams of CHO , and correction factor is 1 unit for every 35 mg/dL> 409100 mg/dL    Inpatient Diabetes Program Recommendations:     Pt/mother to continue managing pump. RN completed assessment this am. Contract signed.  Continue to follow.  Thank you. Dylan Butler, RD, LDN, CDE Inpatient Diabetes Coordinator [phone]
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
## 2018-02-06 NOTE — Progress Notes (Signed)
Bed is cleaned and ready for patient on 3W Room 37. [facility] called for transport. Patient is stable and will continue to be monitored.

## 2018-02-06 NOTE — Progress Notes (Signed)
[facility] Patient alert and oriented x 4. Patient's mom at bed side. Patient and mom oriented to room. Tele placed and was verified. Will continue to monitor.

## 2018-02-06 NOTE — Progress Notes (Signed)
ULTS* Echocardiogram 2D Echocardiogram has been performed.  Leta JunglingCooper, Karren Newland M 02/06/2018, 2:13 PM

## 2018-02-06 NOTE — Evaluation (Signed)
SLP Cancellation Note  Patient Details Name: Dylan FergusonKaleb Blick MRN: [id number] DOB: 04/25/1996   Cancelled treatment:       Reason Eval/Treat Not Completed: SLP screened, no needs identified, will sign off(spoke to RN who reports pt's symptoms have resolved completely and [facility], will sign off, please reorder if indicated)   Chales AbrahamsKimball, Rashod Gougeon Ann 02/06/2018, 11:24 AM   Donavan Burnetamara Rachael Zapanta, MS [facility] S[facility] [phone]

## 2018-02-06 NOTE — Progress Notes (Signed)
Neurology paged to notify that patient in on the unit.

## 2018-02-07 ENCOUNTER — Inpatient Hospital Stay (HOSPITAL_COMMUNITY): Payer: Medicaid Other

## 2018-02-07 ENCOUNTER — Ambulatory Visit: Payer: Self-pay | Admitting: Nurse Practitioner

## 2018-02-07 DIAGNOSIS — G459 Transient cerebral ischemic attack, unspecified: Secondary | ICD-10-CM

## 2018-02-07 LAB — GLUCOSE, CAPILLARY
GLUCOSE-CAPILLARY: 151 mg/dL — AB (ref 65–99)
GLUCOSE-CAPILLARY: 100 mg/dL — AB (ref 65–99)
GLUCOSE-CAPILLARY: 76 mg/dL (ref 65–99)
GLUCOSE-CAPILLARY: 276 mg/dL — AB (ref 65–99)
GLUCOSE-CAPILLARY: 229 mg/dL — AB (ref 65–99)

## 2018-02-07 LAB — PROTEIN S ACTIVITY: PROTEIN S ACTIVITY: 100 % (ref 63–140)

## 2018-02-07 LAB — LIPID PANEL
Cholesterol: 162 mg/dL (ref 0–200)
HDL: 48 mg/dL (ref >40)
LDL CALC: 94 mg/dL (ref 0–99)
Total CHOL/HDL Ratio: 3.4 RATIO
Triglycerides: 101 mg/dL (ref <150)
VLDL: 20 mg/dL (ref 0–40)

## 2018-02-07 LAB — LUPUS ANTICOAGULANT PANEL
DRVVT: 35.1 s (ref 0.0–47.0)
PTT Lupus Anticoagulant: 32.9 s (ref 0.0–51.9)

## 2018-02-07 LAB — HOMOCYSTEINE: Homocysteine: 5.6 umol/L (ref 0.0–15.0)

## 2018-02-07 LAB — PROTEIN S, TOTAL: Protein S Ag, Total: 81 % (ref 60–150)

## 2018-02-07 LAB — PROTEIN C ACTIVITY: PROTEIN C ACTIVITY: 131 % (ref 73–180)

## 2018-02-07 MED ORDER — ATORVASTATIN CALCIUM 10 MG PO TABS: 10.0000 mg | ORAL_TABLET | Freq: Every day | ORAL | Status: DC

## 2018-02-07 MED ORDER — ASPIRIN 81 MG PO TBEC: 81.0000 mg | DELAYED_RELEASE_TABLET | Freq: Every day | ORAL | 0 refills | Status: AC

## 2018-02-07 MED ORDER — DIVALPROEX SODIUM ER 250 MG PO TB24: 500.0000 mg | ORAL_TABLET | Freq: Every day | ORAL | 0 refills | Status: AC

## 2018-02-07 MED ORDER — ATOMOXETINE HCL 80 MG PO CAPS: 80.0000 mg | ORAL_CAPSULE | Freq: Every day | ORAL | 0 refills | Status: AC

## 2018-02-07 MED ORDER — ATORVASTATIN CALCIUM 10 MG PO TABS: 10.0000 mg | ORAL_TABLET | Freq: Every day | ORAL | 0 refills | Status: AC

## 2018-02-07 MED ORDER — ZIPRASIDONE HCL 80 MG PO CAPS: 160.0000 mg | ORAL_CAPSULE | Freq: Every day | ORAL | 0 refills | Status: AC

## 2018-02-07 MED ORDER — CLONIDINE HCL 0.2 MG PO TABS: 0.2000 mg | ORAL_TABLET | Freq: Every day | ORAL | 0 refills | Status: AC

## 2018-02-07 NOTE — Care Management Note (Signed)
Case Management Note  Patient Details  Name: Dylan FergusonKaleb Butler MRN: [id number] Date of Birth: 01/31/1996  Subjective/Objective:     Pt in with DKA. He is from home with his parents.                Action/Plan: [facility] CM provided choice and they selected [facility]. [facility] and [facility] the [facility].  Pt with orders for shower stool. [facility] notified and delivered to the room.  Mother to provide transportation home.  Expected Discharge Date:  02/07/18               Expected Discharge Plan:  Home w Home Health Services  In-House Referral:     Discharge planning Services  CM Consult  Post Acute Care Choice:    Choice offered to:  Patient, Parent  DME Arranged:    DME Agency:     HH Arranged:  PT, OT [facility]  Status of Service:  Completed, signed off  If discussed at Long Length of Stay Meetings, dates discussed:    Additional Comments:  Kermit BaloKelli F Traci Plemons, RN 02/07/2018, 3:58 PM

## 2018-02-07 NOTE — Evaluation (Signed)
Physical Therapy Evaluation Patient Details Name: Dylan Butler MRN: [id number] DOB: 1996-02-17 Today's Date: 02/07/2018   History of Present Illness  22 y.o. male with history of diabetes, other history of schizophrenia, oppositional defiant disorder, multiple personality disorder, bipolar disorder, anxiety. On 02/05/2018 he apparently developed left upper and lower extremity weakness as well as a facial droop that morning. MRI- for CVA.  Clinical Impression  Pt admitted with above diagnosis. Pt currently with functional limitations due to the deficits listed below (see PT Problem List). At this time, patient main deficits include impaired balance and sensation compared to baseline. Cognition is at baseline per patient mother and she is available for 24/7 assistance. Pt will benefit from skilled PT to increase their independence and safety with mobility to allow discharge to the venue listed below.       Follow Up Recommendations Home health PT;Supervision for mobility/OOB    Equipment Recommendations  None recommended by PT    Recommendations for Other Services       Precautions / Restrictions Precautions Precautions: Fall Restrictions Weight Bearing Restrictions: No      Mobility  Bed Mobility Overal bed mobility: Modified Independent                Transfers Overall transfer level: Needs assistance Equipment used: None Transfers: Sit to/from Stand;Stand Pivot Transfers Sit to Stand: Supervision Stand pivot transfers: Min guard       General transfer comment: Patient with mild LOB with sit to stand, requiring him to hold onto bed rail upon standing  Ambulation/Gait Ambulation/Gait assistance: Min guard Ambulation Distance (Feet): 200 Feet Assistive device: None Gait Pattern/deviations: Decreased weight shift to left Gait velocity: decreased Gait velocity interpretation: Below normal speed for age/gender General Gait Details: Patient with decreased gait speed and  decreased L stance time compared to baseline. Slightly unsteady gait.  Stairs Stairs: Yes Stairs assistance: Min guard Stair Management: Two rails Number of Stairs: 2 General stair comments: Min guard for step negotiation.   Wheelchair Mobility    Modified Rankin (Stroke Patients Only) Modified Rankin (Stroke Patients Only) Pre-Morbid Rankin Score: Moderate disability Modified Rankin: Moderately severe disability     Balance Overall balance assessment: Needs assistance Sitting-balance support: No upper extremity supported;Feet supported Sitting balance-Leahy Scale: Good   Postural control: (R bias) Standing balance support: During functional activity Standing balance-Leahy Scale: Fair                               Pertinent Vitals/Pain Pain Assessment: No/denies pain    Home Living Family/patient expects to be discharged to:: Private residence Living Arrangements: Parent Available Help at Discharge: Family;Available 24 hours/day Type of Home: House         Home Equipment: None      Prior Function Level of Independence: Independent         Comments: loves to play Pok [employer] mon video games     Hand Dominance   Dominant Hand: Right    Extremity/Trunk Assessment   Upper Extremity Assessment Upper Extremity Assessment: Defer to OT evaluation LUE Deficits / Details: generalized weakness; weaker distally, especially in unlar n distribution LUE Sensation: decreased light touch;decreased proprioception LUE Coordination: decreased fine motor;decreased gross motor    Lower Extremity Assessment Lower Extremity Assessment: RLE deficits/detail;LLE deficits/detail RLE Deficits / Details: Patient reports previous R knee football injury. WFL for tasks assessed LLE Deficits / Details: Patient reporting LLE numbness    Cervical /  Trunk Assessment Cervical / Trunk Assessment: Normal  Communication   Communication: Expressive difficulties(most likely at  baseline)  Cognition Arousal/Alertness: Awake/alert Behavior During Therapy: Restless Overall Cognitive Status: History of cognitive impairments - at baseline                                 General Comments: per Mom, pt is at his baeline cognitively      General Comments General comments (skin integrity, edema, etc.): Patient mother present    Exercises     Assessment/Plan    PT Assessment Patient needs continued PT services  PT Problem List Impaired sensation;Decreased strength;Decreased balance       PT Treatment Interventions Gait training;Stair training;Functional mobility training;Therapeutic activities;Therapeutic exercise;Balance training;Patient/family education    PT Goals (Current goals can be found in the Care Plan section)  Acute Rehab PT Goals Patient Stated Goal: to go home and play video games PT Goal Formulation: With patient/family Time For Goal Achievement: 02/21/18 Potential to Achieve Goals: Good    Frequency Min 3X/week   Barriers to discharge        Co-evaluation               AM-PAC PT "6 Clicks" Daily Activity  Outcome Measure Difficulty turning over in bed (including adjusting bedclothes, sheets and blankets)?: None Difficulty moving from lying on back to sitting on the side of the bed? : None Difficulty sitting down on and standing up from a chair with arms (e.g., wheelchair, bedside commode, etc,.)?: A Little Help needed moving to and from a bed to chair (including a wheelchair)?: A Little Help needed walking in hospital room?: A Little Help needed climbing 3-5 steps with a railing? : A Little 6 Click Score: 20    End of Session Equipment Utilized During Treatment: Gait belt Activity Tolerance: Patient tolerated treatment well Patient left: in bed;with call bell/phone within reach;with family/visitor present   PT Visit Diagnosis: Unsteadiness on feet (R26.81);Difficulty in walking, not elsewhere classified (R26.2)     Time: 1515-1530 PT Time Calculation (min) (ACUTE ONLY): 15 min   Charges:   PT Evaluation $PT Eval Moderate Complexity: 1 Mod     PT G Codes:        Laurina Bustlearoline Monnie Anspach, PT, DPT Acute Rehabilitation Services  Pager: [phone]   Vanetta MuldersCarloine H Alexandros Ewan 02/07/2018, 4:22 PM

## 2018-02-07 NOTE — Progress Notes (Signed)
Inpatient Diabetes Program Recommendations  AACE/ADA: New Consensus Statement on Inpatient Glycemic Control (2015)  Target Ranges:  Prepandial:   less than 140 mg/dL      Peak postprandial:   less than 180 mg/dL (1-2 hours)      Critically ill patients:  140 - 180 mg/dL  Results for Dylan Butler, Dylan Butler (MRN [id number]) as of 02/07/2018 14:10  Ref. Range 02/06/2018 08:18 02/06/2018 12:08 02/06/2018 16:43 02/06/2018 19:23 02/06/2018 20:31 02/07/2018 01:05 02/07/2018 05:08 02/07/2018 08:04 02/07/2018 12:38  Glucose-Capillary Latest Ref Range: 65 - 99 mg/dL 409 (H) 811 (H) 914 (H) 342 (H) 269 (H) 151 (H) 100 (H) 76 276 (H)   Results for Dylan Butler, Dylan Butler (MRN [id number]) as of 02/07/2018 14:10  Ref. Range 12/06/2016 14:50 02/03/2018 09:54  Hemoglobin A1C Latest Ref Range: 4.8 - 5.6 % 9.2 (H) 11.9 (H)   Review of Glycemic Control  Diabetes history: DM1 (makes no insulin; his insulin pump provides basal, correction, and meal coverage) Outpatient Diabetes medications: Medtronic 670G with Humalog Current orders for Inpatient glycemic control: Medtronic insulin pump  Inpatient Diabetes Program Recommendations: HgbA1C: A1C 11.9% on 02/03/18 indicating an average glucose of 295 mg/dl over the past 2-3 months. Patient needs to follow up with Dr. Sharl Ma regarding improving DM control.  Spoke with patient regarding diabetes and home regimen for diabetes management. Patient has MR and his mother manages his DM and puts all information into his insulin pump for meal coverage and correction. Patient's mother ws not in the room at the time of visit but patient called his mother on the phone and I spoke with her briefly.  Patient's mother states that patient is followed by Dr. Sharl Ma and he has a Medtronic 670G insulin pump with Humalog. Inquired about glucose elevation yesterday and she stated that patient was stressed ad his glucose is usually more elevated when he is upset or stressed. Patient has insulin pump attached on right side of abdomen.  Inquired  about pump settings and patient mother stated that his basal rate was 1.25 units/hr for 24 hours and insulin to carb ratio was 1:9 grams and his sensitivity factor is 1:35 mg/dl. However, after reviewing actual pump settings noted basal rates were different than what she reported.  Asked if I could review insulin pump settings and patient's mother gave permission and asked her son to give me his insulin pump. Reviewed insulin pump settings are as follows:  Basal insulin  12A 1.20 units/hour 4A 1.40 units/hour 8A 1.20  Total daily basal insulin: 29.6 units/24 hours  Carb Coverage 1:9 1 unit for every 9 grams of carbohydrates  Insulin Sensitivity 1:35 1 unit drops blood glucose 35 mg/dl  Target Glucose Goals 08M 100-100 mg/dl .  Gave patient back his insulin pump once setting were reviewed. Encouraged patient's mother to be sure corrections and meal coverage are done consistently while inpatient as well as outpatient.  Will continue to follow along while inpatient.  NURSING:  The insulin pump contract should be signed by the patient and then placed in the chart. The patient insulin pump flow sheet will be completed by the patient at the bedside and the RN caring for the patient will use the patient's flow sheet to document in the [facility]. RN will need to complete the Nursing Insulin Pump Flowsheet at least once a shift. Patient will need to keep extra insulin pump supplies at the bedside at all times.   Thanks, Orlando Penner, RN, MSN, CDE Diabetes Coordinator Inpatient Diabetes Program [phone] (Team Pager  from 8am to 5pm)

## 2018-02-07 NOTE — Discharge Summary (Signed)
Physician Discharge Summary  Dylan Butler WVP:[id number] DOB: 1996/10/07 DOA: 02/03/2018  PCP: System, Provider Not In  Admit date: 02/03/2018 Discharge date: 02/07/2018  Admitted From: Home Disposition: Home   Recommendations for Outpatient Follow-up:  1. Follow up with PCP, endocrinology in 1-2 weeks 2. Follow up with neurology in 6 weeks.  Home Health: OT Equipment/Devices: Bench Discharge Condition: Stable CODE STATUS: Full Diet recommendation: Carb-modified  Brief/Interim Summary: Patient is a 22 year old gentleman history of diabetes, bipolar disorder, schizophrenia, oppositional defiant disorder, anxiety, mental retardation who was admitted with nausea vomiting recent ear infection and noted to be in DKA.  Patient was admitted to the stepdown unit where he was placed on the glucose stabilizer, IV fluids with resolution of DKA.  Patient was subsequently transitioned back to his insulin pump. On the morning of 02/05/2018 patient was noted to have a left upper and lower extremity weakness, facial droop and slurred speech and as such a code stroke was activated.  CT which was done was negative.  MRI did not show any acute stroke however did show a single punctate focus of T2 and flair signal in the right frontal white matter, not likely significant as an isolated finding. Patient symptoms resolved within an hour and was close to baseline. TPA was not administered as patient's symptoms resolved quickly.  Neurology consultation was placed on 02/06/2018 and recommendations were made for transfer to [facility] for further neurological workup.  Patient will be placed on aspirin 325 mg daily for TIA.   Discharge Diagnoses:  Principal Problem:   DKA (diabetic ketoacidoses) ([place]) Active Problems:   Major depressive disorder, recurrent episode, severe, with psychosis ([place])   Weakness of left upper extremity   Developmental delay disorder   TIA (transient ischemic attack): probable   Bipolar 1 disorder  ([place])   Schizophrenia ([place])   HTN (hypertension)  DKA: Likely triggered by recent ear infection.  Patient was placed in the stepdown unit and placed on the glucose stabilizer however has been transitioned back to home insulin pump.  Hemoglobin A1c 11.9 on 02/03/2018. CBGs 71-148.  Continue insulin pump.  Outpatient follow-up with Dr. Buddy Duty.   Left-sided weakness/slurred speech/facial droop: Symptoms largely resolved, suspected TIA.  Patient with onset of symptoms the morning of 02/05/2018 which rapidly resolved within an hour.  CT head unremarkable.  MRI head with no significant acute abnormalities however did have a single punctate focus of T2 and flair signal in the right frontal white matter, not likely significant as an isolated finding.  - Start aspirin and statin  Bipolar disorder Currently stable.  Continue home regimen Depakote.  Schizophrenia Stable.  Continue Strattera and Geodon. Refills x2 weeks were provided at discharge as pt's therapist was not able to be reached on Friday evening and no medications were left at home.  Hypertension Blood pressure stable.    Developmental delay/mental retardation Stable.  Discharge Instructions Discharge Instructions    Ambulatory referral to Neurology   Complete by:  As directed    Follow up with stroke clinic NP (Jessica Vanschaick or Cecille Rubin, if both not available, consider Dr. Antony Contras, Dr. Bess Harvest, or Dr. Sarina Ill) at [facility] in about 4 weeks.   Diet Carb Modified   Complete by:  As directed    Discharge instructions   Complete by:  As directed    - Start taking aspirin 60m daily - Start taking atorvastatin daily Both of these are to reduce your risk of stroke.  - Follow  up with your endocrinologist and continue using insulin pump.  - Follow up with neurology in about 4 weeks.  - If your symptoms return, seek medical attention.  - I apologize for not being able to have you  discharged by 3pm.     Allergies as of 02/07/2018      Reactions   Other Shortness Of Breath, Nausea And Vomiting   Honey Mustard      Medication List    STOP taking these medications   acetaminophen 500 MG tablet Commonly known as:  TYLENOL   amantadine 100 MG capsule Commonly known as:  SYMMETREL   doxycycline 100 MG capsule Commonly known as:  VIBRAMYCIN   insulin lispro 100 UNIT/ML KiwkPen Commonly known as:  HUMALOG KWIKPEN     TAKE these medications   aspirin 81 MG EC tablet Take 1 tablet (81 mg total) by mouth daily. Start taking on:  02/08/2018   atomoxetine 80 MG capsule Commonly known as:  STRATTERA Take 80 mg by mouth daily. What changed:  Another medication with the same name was removed. Continue taking this medication, and follow the directions you see here.   atorvastatin 10 MG tablet Commonly known as:  LIPITOR Take 1 tablet (10 mg total) by mouth daily.   blood glucose meter kit and supplies Kit Dispense based on patient and insurance preference. Use up to four times daily as directed. (FOR ICD-9 250.00, 250.01).   cloNIDine 0.2 MG tablet Commonly known as:  CATAPRES Take 0.2 mg by mouth daily.   divalproex 250 MG 24 hr tablet Commonly known as:  DEPAKOTE ER Take 500 mg by mouth daily.   fluticasone 50 MCG/ACT nasal spray Commonly known as:  FLONASE Place 2 sprays into both nostrils daily.   glucose blood test strip Commonly known as:  GLUCOSE METER TEST Use as instructed for 4 times daily testing of blood glucose.   Insulin Pen Needle 31G X 6 MM Misc 1 Device by Does not apply route daily at 8 pm.   insulin pump Soln Inject 1.25 each into the skin. Humalog.   nystatin powder Commonly known as:  MYCOSTATIN/NYSTOP Apply topically 4 (four) times daily. What changed:    how much to take  when to take this  reasons to take this   ziprasidone 80 MG capsule Commonly known as:  GEODON Take 160 mg by mouth at bedtime. What changed:   Another medication with the same name was removed. Continue taking this medication, and follow the directions you see here.            Durable Medical Equipment  (From admission, onward)        Start     Ordered   02/07/18 1524  DME tub bench  Once     02/07/18 1526     Follow-up Information    [facility] Follow up in 4 week(s).   Specialty:  Neurology Why:  stroke clinic. Office will call with appointment date and time. Contact information: [address] [place] [phone]       Delrae Rend, MD Follow up.   Specialty:  Endocrinology Contact information: [address] [place] [address] [phone]          Allergies  Allergen Reactions  . Other Shortness Of Breath and Nausea And Vomiting    Honey Mustard    Consultations:  Neurology  Procedures/Studies: Mr Brain Wo Contrast  Result Date: 02/05/2018 CLINICAL DATA:  Slurred speech and left-sided facial droop beginning a few hours ago. EXAM: MRI HEAD WITHOUT CONTRAST TECHNIQUE: Multiplanar, multiecho pulse sequences of the brain and surrounding structures were obtained without intravenous contrast. COMPARISON:  CT same day. FINDINGS: Brain: Brain has normal appearance without evidence of malformation, atrophy, old or acute small or large vessel infarction, mass lesion, hemorrhage, hydrocephalus or extra-axial collection. Single exception to this is a punctate focus of T2 and FLAIR signal in the right frontal white matter. As an isolated finding, this is not likely significant. Vascular: Major vessels at the base of the brain show flow. Venous sinuses appear patent. Skull and upper cervical spine: Normal. Sinuses/Orbits: Clear/normal. Other: None significant. IMPRESSION: No acute or subacute insult. Normal study with exception of a single punctate focus of T2 and FLAIR signal in the right frontal white matter, not likely significant as an  isolated finding. Electronically Signed   By: Nelson Chimes M.D.   On: 02/05/2018 09:49   Mr Jodene Nam Head Wo Contrast  Result Date: 02/06/2018 CLINICAL DATA:  Mental retardation, episodic weakness. Suspected TIA. Diabetes. Bipolar disorder. EXAM: MRA HEAD WITHOUT CONTRAST TECHNIQUE: Angiographic images of the Circle of Willis were obtained using MRA technique without intravenous contrast. COMPARISON:  MRI brain 02/05/2018 was negative. CT head 02/05/2018 was negative. FINDINGS: Motion degradation is present. The study is suboptimal. Small or subtle lesions could be overlooked. There is gross patency of the internal carotid arteries and basilar artery. The LEFT vertebral is the dominant/sole contributor to the basilar, and the RIGHT vertebral appears to contribute predominantly to PICA. There is no visible intracranial stenosis, occlusion, dissection, or saccular aneurysm. IMPRESSION: Motion degraded examination demonstrates grossly patent intracranial circulation. Electronically Signed   By: Staci Righter M.D.   On: 02/06/2018 12:37   Ct Head Code Stroke Wo Contrast`  Result Date: 02/05/2018 CLINICAL DATA:  Code stroke. 22 year old male with left facial droop and slurred speech at 0750 hours. Mild left upper extremity weakness. Down syndrome. EXAM: CT HEAD WITHOUT CONTRAST TECHNIQUE: Contiguous axial images were obtained from the base of the skull through the vertex without intravenous contrast. COMPARISON:  Head CT without contrast 05/27/2017. FINDINGS: Brain: Cerebral volume remains normal. No midline shift, ventriculomegaly, mass effect, evidence of mass lesion, intracranial hemorrhage or evidence of cortically based acute infarction. Gray-white matter differentiation is within normal limits throughout the brain. Vascular: No suspicious intracranial vascular hyperdensity. Skull: Stable and negative. Sinuses/Orbits: Visualized paranasal sinuses and mastoids are stable and well pneumatized. There is chronic left  mastoid sclerosis. Other: Visualized orbits and scalp soft tissues are within normal limits. ASPECTS ([place] Stroke Program Early CT Score) - Ganglionic level infarction (caudate, lentiform nuclei, internal capsule, insula, M1-M3 cortex): 7 - Supraganglionic infarction (M4-M6 cortex): 3 Total score (0-10 with 10 being normal): 10 IMPRESSION: 1. Stable and normal noncontrast CT appearance of the brain. 2. ASPECTS is 10. 3. These results were communicated to Dr. Gala Romney at 8:41 amon 4/3/2019by text page via the [facility] messaging system. Electronically Signed   By: Genevie Ann M.D.   On: 02/05/2018 08:41   US Abdomen Limited Ruq  Result Date: 02/03/2018 CLINICAL DATA:  Nausea and vomiting.  History of diabetes. EXAM: ULTRASOUND ABDOMEN LIMITED RIGHT UPPER QUADRANT COMPARISON:  None in PACs FINDINGS: Gallbladder: No gallstones or wall thickening visualized. No sonographic Murphy sign noted by sonographer. Common bile duct: Diameter: 2.4 mm Liver: The hepatic echotexture is normal. The surface contour is smooth. There is no focal mass or ductal dilation. Portal vein  is patent on color Doppler imaging with normal direction of blood flow towards the liver. IMPRESSION: Normal right upper quadrant abdominal ultrasound. Electronically Signed   By: David  Martinique M.D.   On: 02/03/2018 12:03    Echocardiogram - Left ventricle: The cavity size was normal. Wall thickness was   normal. Systolic function was normal. The estimated ejection   fraction was in the range of 60% to 65%. Wall motion was normal;   there were no regional wall motion abnormalities. Left   ventricular diastolic function parameters were normal. - Aortic valve: There was no stenosis. - Mitral valve: There was no regurgitation. - Right ventricle: The cavity size was normal. Systolic function   was normal. - Pulmonary arteries: No complete TR doppler jet so unable to   estimate PA systolic pressure. - Inferior vena cava: The vessel was normal  in size. The   respirophasic diameter changes were in the normal range (= 50%),   consistent with normal central venous pressure.  Impressions:  - Normal study.  Carotid U/S Right Carotid: Velocities in the right ICA are consistent with a 1-39% stenosis.  Left Carotid: Velocities in the left ICA are consistent with a 1-39% stenosis.  Vertebrals: Bilateral vertebral arteries demonstrate antegrade flow. Right      vertebral artery demonstrates high resistant flow. Subjective: Feels fine, wants to go home. Symptoms have resolved, ambulating without issues.  Discharge Exam: Vitals:   02/07/18 0806 02/07/18 1200  BP: 114/73 118/79  Pulse: 90 84  Resp: 16 16  Temp: 98 F (36.7 C) 98.2 F (36.8 C)  SpO2: 94% 96%   General: Pt is alert, awake, not in acute distress Cardiovascular: RRR, S1/S2 +, no rubs, no gallops Respiratory: CTA bilaterally, no wheezing, no rhonchi Abdominal: Soft, NT, ND, bowel sounds + Extremities: No edema, no cyanosis Neuro: No acute deficits  Labs: BNP (last 3 results) No results for input(s): BNP in the last 8760 hours. Basic Metabolic Panel: Recent Labs  Lab 02/03/18 0954  02/04/18 0037 02/04/18 0509 02/04/18 1630 02/05/18 0431 02/06/18 0818  NA 137   < > 137  137 139 137 142 140  K 5.3*   < > 3.5  3.5 3.1* 4.1 3.7 3.5  CL 103   < > 111  111 110 106 111 104  CO2 7*   < > 17*  16* 16* 22 25 28   GLUCOSE 498*   < > 142*  141* 122* 290* 140* 139*  BUN 28*   < > 14  14 12 12 8 7   CREATININE 1.07   < > 0.74  0.72 0.76 0.81 0.56* 0.72  CALCIUM 9.2   < > 7.9*  8.0* 8.1* 8.5* 8.8* 8.9  MG 2.1  --   --   --   --   --   --   PHOS 6.0*  --   --   --   --   --   --    < > = values in this interval not displayed.   Liver Function Tests: Recent Labs  Lab 02/03/18 0954 02/04/18 0037  AST 21 12*  ALT 21 13*  ALKPHOS 160* 84  BILITOT 1.5* 0.8  PROT 7.8 6.0*  ALBUMIN 4.4 3.3*   Recent Labs  Lab 02/03/18 0954  LIPASE 16   No  results for input(s): AMMONIA in the last 168 hours. CBC: Recent Labs  Lab 02/03/18 0954 02/03/18 1001 02/04/18 0037 02/05/18 0431  WBC 22.5*  --  15.9*  6.0  HGB 15.6 17.7* 12.7* 12.2*  HCT 46.8 52.0 38.6* 37.1*  MCV 83.6  --  82.3 80.3  PLT 303  --  222 228   Cardiac Enzymes: No results for input(s): CKTOTAL, CKMB, CKMBINDEX, TROPONINI in the last 168 hours. BNP: Invalid input(s): POCBNP CBG: Recent Labs  Lab 02/06/18 2031 02/07/18 0105 02/07/18 0508 02/07/18 0804 02/07/18 1238  GLUCAP 269* 151* 100* 76 276*   D-Dimer No results for input(s): DDIMER in the last 72 hours. Hgb A1c No results for input(s): HGBA1C in the last 72 hours. Lipid Profile Recent Labs    02/06/18 2215  CHOL 162  HDL 48  LDLCALC 94  TRIG 101  CHOLHDL 3.4   Thyroid function studies No results for input(s): TSH, T4TOTAL, T3FREE, THYROIDAB in the last 72 hours.  Invalid input(s): FREET3 Anemia work up No results for input(s): VITAMINB12, FOLATE, FERRITIN, TIBC, IRON, RETICCTPCT in the last 72 hours. Urinalysis    Component Value Date/Time   COLORURINE YELLOW 02/03/2018 [place] 02/03/2018 0819   LABSPEC 1.023 02/03/2018 0819   PHURINE 5.0 02/03/2018 0819   GLUCOSEU >=500 (A) 02/03/2018 0819   HGBUR SMALL (A) 02/03/2018 0819   BILIRUBINUR NEGATIVE 02/03/2018 0819   BILIRUBINUR negative 12/06/2016 1453   KETONESUR 80 (A) 02/03/2018 0819   PROTEINUR 30 (A) 02/03/2018 0819   UROBILINOGEN 0.2 12/06/2016 1453   NITRITE NEGATIVE 02/03/2018 0819   LEUKOCYTESUR NEGATIVE 02/03/2018 0819    Microbiology Recent Results (from the past 240 hour(s))  MRSA PCR Screening     Status: None   Collection Time: 02/03/18  1:30 PM  Result Value Ref Range Status   MRSA by PCR NEGATIVE NEGATIVE Final    Comment:        The GeneXpert MRSA Assay (FDA approved for NASAL specimens only), is one component of a comprehensive MRSA colonization surveillance program. It is not intended  to diagnose MRSA infection nor to guide or monitor treatment for MRSA infections. Performed at [facility], [place] [address], [place], [place] [address]     Time coordinating discharge: Approximately 40 minutes  Vance Gather, MD  [facility] 02/07/2018, 3:26 PM Pager [phone]

## 2018-02-07 NOTE — Progress Notes (Signed)
   02/07/18 1700  Clinical Encounter Type  Visited With Family  Visit Type Follow-up  Stress Factors  Family Stress Factors Financial concerns   Encountered the mother April in the hallway this morning.  Had gotten to know she and the patient at [facility] on Wednesday.  Mother indicated he was brought to [facility] because of mini stroke and would need rehab.  Learned while at [facility] about the financial issues for the family.  In speaking with Theda Belfast we were able to provide some meal tickets for the mom.  Will have Social Worker follow up as mom is worried about getting document in place for guardianship since patient is over 18 and not sure he can make decisions for himself.  Will follow and support as needed. Chaplain Agustin Cree

## 2018-02-07 NOTE — Progress Notes (Signed)
Carotid artery duplex has been completed. 1-39% ICA stenosis bilaterally.  02/07/18 10:20 AM Olen CordialGreg [place]assen [place] RVT

## 2018-02-07 NOTE — Progress Notes (Signed)
Occupational Therapy Evaluation Patient Details Name: Dylan FergusonKaleb Laba MRN: [id number] DOB: 07/17/1996 Today's Date: 02/07/2018    History of Present Illness 22 y.o. male with history of diabetes, other history of schizophrenia, oppositional defiant disorder, multiple personality disorder, bipolar disorder, anxiety. On 02/05/2018 he apparently developed left upper and lower extremity weakness as well as a facial droop that morning. MRI- for CVA.   Clinical Impression   PTA, pt lived with his Mom, who provided 24/7 S. Pt overall independent with mobility and ADL. Mom reports Rhae LernerKaleb as being "clumsy".Patient currently requires minguard A with mobility and ADL. 1 LOB during session but pt ableto recover independently.  Pt continues to complain of LUE weakness/numbness. Recomend follow up with HHOT and use of shower seat for bathing rto reduce risk of falls. Mom in agreement. Will follow acutely to facilitate safe DC home.     Follow Up Recommendations  Home health OT;Supervision/Assistance - 24 hour    Equipment Recommendations  Tub/shower seat    Recommendations for Other Services PT consult     Precautions / Restrictions Precautions Precautions: Fall      Mobility Bed Mobility Overal bed mobility: Modified Independent                Transfers Overall transfer level: Needs assistance Equipment used: 1 person hand held assist Transfers: Sit to/from Stand;Stand Pivot Transfers Sit to Stand: Min guard Stand pivot transfers: Min guard            Balance Overall balance assessment: Needs assistance   Sitting balance-Leahy Scale: Fair   Postural control: (R bias) Standing balance support: During functional activity                               ADL either performed or assessed with clinical judgement   ADL Overall ADL's : Needs assistance/impaired                                     Functional mobility during ADLs: Min guard General ADL  Comments: Pt overall close S with all ADL tasks. Discussed recommendation for pt to use a showerchair and have S when bathing. Mother verbalized understanding.      Vision Patient Visual Report: No change from baseline       Perception     Praxis      Pertinent Vitals/Pain Pain Assessment: No/denies pain     Hand Dominance Right   Extremity/Trunk Assessment Upper Extremity Assessment Upper Extremity Assessment: LUE deficits/detail LUE Deficits / Details: generalized weakness; weaker distally, especially in unlar n distribution LUE Sensation: decreased light touch;decreased proprioception LUE Coordination: decreased fine motor;decreased gross motor   Lower Extremity Assessment Lower Extremity Assessment: Defer to PT evaluation   Cervical / Trunk Assessment Cervical / Trunk Assessment: Normal   Communication Communication Communication: Expressive difficulties(most likely at baseline)   Cognition Arousal/Alertness: Awake/alert Behavior During Therapy: Restless Overall Cognitive Status: History of cognitive impairments - at baseline                                 General Comments: per Mom, pt is at his baeline cognitively   General Comments       Exercises     Shoulder Instructions      Home Living Family/patient expects to be discharged to::  Private residence   Available Help at Discharge: Family;Available 24 hours/day Type of Home: House             Bathroom Shower/Tub: Walk-in Human resources officer: Standard Bathroom Accessibility: Yes How Accessible: Accessible via walker Home Equipment: None          Prior Functioning/Environment Level of Independence: Independent        Comments: loves to play Pok e mon video games        OT Problem List: Decreased strength;Decreased activity tolerance;Impaired balance (sitting and/or standing);Decreased coordination;Decreased safety awareness;Decreased knowledge of use of DME or  AE;Impaired sensation;Impaired UE functional use      OT Treatment/Interventions: Self-care/ADL training;Therapeutic exercise;Neuromuscular education;DME and/or AE instruction;Therapeutic activities;Balance training;Patient/family education    OT Goals(Current goals can be found in the care plan section) Acute Rehab OT Goals Patient Stated Goal: to go home and play video games OT Goal Formulation: With patient/family Time For Goal Achievement: 02/21/18 Potential to Achieve Goals: Good  OT Frequency: Min 3X/week   Barriers to D/C:            Co-evaluation              AM-PAC PT "6 Clicks" Daily Activity     Outcome Measure Help from another person eating meals?: None Help from another person taking care of personal grooming?: A Little Help from another person toileting, which includes using toliet, bedpan, or urinal?: A Little Help from another person bathing (including washing, rinsing, drying)?: A Little Help from another person to put on and taking off regular upper body clothing?: None Help from another person to put on and taking off regular lower body clothing?: A Little 6 Click Score: 20   End of Session Nurse Communication: Mobility status  Activity Tolerance: Patient tolerated treatment well Patient left: in bed;with call bell/phone within reach;with bed alarm set  OT Visit Diagnosis: Unsteadiness on feet (R26.81);Muscle weakness (generalized) (M62.81)                Time: 1610-9604 OT Time Calculation (min): 22 min Charges:  OT General Charges $OT Visit: 1 Visit OT Evaluation $OT Eval Moderate Complexity: 1 Mod G-Codes:     Isaiah Cianci, OT/L  [phone] 02/07/2018  Lynetta Tomczak,HILLARY 02/07/2018, 1:31 PM

## 2018-02-07 NOTE — Progress Notes (Signed)
STROKE TEAM PROGRESS NOTE   INTERVAL HISTORY His mother is at the bedside.  OT is also at bedside. Still with some L hand weakness and numbness. Patient eating lunch.   CBC:  CBC Latest Ref Rng & Units 02/05/2018 02/04/2018 02/03/2018  WBC 4.0 - 10.5 K/uL 6.0 15.9(H) -  Hemoglobin 13.0 - 17.0 g/dL 12.2(L) 12.7(L) 17.7(H)  Hematocrit 39.0 - 52.0 % 37.1(L) 38.6(L) 52.0  Platelets 150 - 400 K/uL 228 222 -     Comprehensive Metabolic Panel:   CMP Latest Ref Rng & Units 02/06/2018 02/05/2018 02/04/2018  Glucose 65 - 99 mg/dL 409(W139(H) 119(J140(H) 478(G290(H)  BUN 6 - 20 mg/dL 7 8 12   Creatinine 0.61 - 1.24 mg/dL 9.560.72 2.13(Y0.56(L) 8.650.81  Sodium 135 - 145 mmol/L 140 142 137  Potassium 3.5 - 5.1 mmol/L 3.5 3.7 4.1  Chloride 101 - 111 mmol/L 104 111 106  CO2 22 - 32 mmol/L 28 25 22   Calcium 8.9 - 10.3 mg/dL 8.9 7.8(I8.8(L) 6.9(G8.5(L)  Total Protein 6.5 - 8.1 g/dL - - -  Total Bilirubin 0.3 - 1.2 mg/dL - - -  Alkaline Phos 38 - 126 U/L - - -  AST 15 - 41 U/L - - -  ALT 17 - 63 U/L - - -     Vitals:   02/07/18 0200 02/07/18 0400 02/07/18 0600 02/07/18 0806  BP: 114/65 108/72 107/64 114/73  Pulse: 84 81 (!) 54 90  Resp: 18 18 18 16   Temp:  97.7 F (36.5 C) 97.8 F (36.6 C) 98 F (36.7 C)  TempSrc:  Oral Axillary Oral  SpO2: 97% 98% 98%   Weight:      Height:       PHYSICAL EXAM  pleasant young Caucasian male sitting up in bed eating lunch. . Afebrile. Head is nontraumatic. Neck is supple without bruit.    Cardiac exam no murmur or gallop. Lungs are clear to auscultation. Distal pulses are well felt.he does not have typical facies for Down syndrome Neurological Exam ;  Awake  Alert oriented x 2.diminished attention, registration and recall.. Normal speech but childish quality .eye movements full without nystagmus.fundi were not visualized. Vision acuity and fields appear normal. Hearing is normal. Palatal movements are normal. Face symmetric. Tongue midline. Normal strength, tone, reflexes and coordination. Normal  sensation. Gait deferred.  ASSESSMENT/PLAN Mr. Dylan Butler is a 22 y.o. male with history of diabetes, other history of schizophrenia, oppositional defiant disorder, multiple personality disorder, bipolar disorder, anxiety admitted 02/03/2018 with nausea and vomiting after a recent ear infection who developed left hand weakness as well as facial droop in hospital. [facility] and [facility].   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 ASPECTS 10.     MRI  No acute stroke.   MRA  Motion degraded but normal  Carotid Doppler  B ICA 1-39% stenosis, VAs antegrade   2D Echo  EF 60-65%. No source of embolus   LDL 94  HgbA1c 11.9  Lovenox 40 mg sq daily for VTE prophylaxis  Fall precautions  Diet Carb Modified Fluid consistency: Thin; Room service appropriate? Yes  No antithrombotic prior to admission, now on aspirin 81 mg daily. Continue at discharge  Therapy recommendations:  HH OT  Disposition:  Return home with mother  Nothing further to add from the stroke standpoint. Stroke team will sign off. Follow up in the office in 4 weeks. Orders placed.  Hypertension  Stable . BP goal normotensive  Hyperlipidemia, LDL 94, on no statin PTA, now on no statin, added lipitor 10, goal LDL < 70   Diabetes, type II, uncontrolled, HgbA1c 11.9, goal < 7.0 . Has insulin pump that mother monitors. Followed by Dr. Sharl Ma  Other Active Problems  ADHD  Anxiety  Bipolar 1 disorder  Multiple personality disorder  Oppositional defiant disorder  Schizophrenia  Hospital day # 4  Annie Main, MSN, APRN, ANVP-BC, AGPCNP-BC Advanced Practice Stroke Nurse [facility] See Amion for Schedule & Pager information 02/07/2018 2:48 PM  I have personally examined this patient, reviewed notes, independently viewed imaging studies, participated in medical decision making and plan of  care.ROS completed by me personally and pertinent positives fully documented  I have made any additions or clarifications directly to the above note. Agree with note above.He presented with transient left hemiparesis likely due to right brain TIA probably from small vessel disease related to diabetes which is long-standing and hyperlipidemia and hypertension. Recommend aspirin and aggressive risk factor modification.   Long discussion with the patient and mother at the bedside and answered questions.reater than 50% time during this 25 minute visit was spent on counseling and coordination of care about TIA and stroke risk, discussion about prevention and treatment and answered questions. Discussed with Dr. Jarvis Newcomer.stroke team will sign off. Kindly call for questions  Delia Heady, MD Medical Director [facility] Pager: [phone] 02/07/2018 4:14 PM  To contact Stroke Continuity provider, please refer to [email or web address]. After hours, contact General Neurology

## 2018-02-09 LAB — BETA-2-GLYCOPROTEIN I ABS, IGG/M/A
Beta-2 Glyco I IgG: <9 GPI IgG units (ref 0–20)
Beta-2-Glycoprotein I IgA: <9 GPI IgA units (ref 0–25)

## 2018-02-09 LAB — CARDIOLIPIN ANTIBODIES, IGG, IGM, IGA: Anticardiolipin IgG: <9 GPL U/mL (ref 0–14)

## 2018-02-10 LAB — FACTOR 5 LEIDEN

## 2018-02-11 LAB — PROTEIN C, TOTAL: Protein C, Total: 126 % (ref 60–150)

## 2018-02-11 LAB — PROTHROMBIN GENE MUTATION

## 2018-02-14 ENCOUNTER — Ambulatory Visit: Payer: Medicaid Other | Admitting: Podiatry

## 2018-02-27 ENCOUNTER — Ambulatory Visit: Payer: Medicaid Other | Admitting: Podiatry

## 2018-03-07 ENCOUNTER — Ambulatory Visit: Payer: Medicaid Other | Admitting: Podiatry

## 2018-03-11 ENCOUNTER — Ambulatory Visit: Payer: Medicaid Other | Admitting: Adult Health

## 2018-03-11 NOTE — Progress Notes (Deleted)
[facility] [address] [place]. [place] [address] [phone]       OFFICE FOLLOW UP NOTE  Mr. Jamille Yoshino Date of Birth:  12-May-1996 Medical Record Number:  [id number]   Reason for Referral:  hospital TIA follow up  CHIEF COMPLAINT:  No chief complaint on file.   HPI: Staci Carver is being seen today for initial visit in the office for TIA on 02/05/18. History obtained from patient, mother and chart review. Reviewed all radiology images and labs personally.  Mr. Zakary Kimura is a 22 y.o. male with history of diabetes, other history of schizophrenia, oppositional defiant disorder, multiple personality disorder, bipolar disorder, anxiety admitted 02/03/2018 with nausea and vomiting after a recent ear infection who developed left hand weakness as well as facial droop in hospital. Evaluated by telemedicine at [facility] and transferred to [facility].  CT head reviewed negative for acute stroke.  MRI head revealed negative for acute stroke.  MRI was motion degraded but normal.  Carotid Doppler showed bilateral ICA stenosis of 1 to 39%.  2D echo showed an EF of 60 to 65% with no source of embolus.  LDL 94 and A1c 11.9.  Patient was not on antithrombotic prior to admission recommended aspirin 81 mg at discharge.  No statin PTA and recommended Lipitor 10 mg.  Patient does have insulin pump for diabetes and is followed by Dr. Buddy Duty recommended close follow-up at discharge for uncontrolled diabetes.  Patient was discharged home with recommendation of home OT.  Since discharge, ***      ROS:   14 system review of systems performed and negative with exception of ***  PMH:  Past Medical History:  Diagnosis Date  . ADHD (attention deficit hyperactivity disorder)   . Anxiety   . Bipolar 1 disorder ([place])   . Diabetes mellitus without complication ([place])   . Multiple personality disorder [facility])    Patient stated he had this condition  . ODD (oppositional defiant disorder)   .  Schizophrenia ([place])     PSH:  Past Surgical History:  Procedure Laterality Date  . HERNIA REPAIR      Social History:  Social History   Socioeconomic History  . Marital status: Single    Spouse name: Not on file  . Number of children: Not on file  . Years of education: Not on file  . Highest education level: Not on file  Occupational History  . Not on file  Social Needs  . Financial resource strain: Not on file  . Food insecurity:    Worry: Not on file    Inability: Not on file  . Transportation needs:    Medical: Not on file    Non-medical: Not on file  Tobacco Use  . Smoking status: Never Smoker  . Smokeless tobacco: Never Used  Substance and Sexual Activity  . Alcohol use: No  . Drug use: No  . Sexual activity: Not on file  Lifestyle  . Physical activity:    Days per week: Not on file    Minutes per session: Not on file  . Stress: Not on file  Relationships  . Social connections:    Talks on phone: Not on file    Gets together: Not on file    Attends religious service: Not on file    Active member of club or organization: Not on file    Attends meetings of clubs or organizations: Not on file    Relationship status: Not on  file  . Intimate partner violence:    Fear of current or ex partner: Not on file    Emotionally abused: Not on file    Physically abused: Not on file    Forced sexual activity: Not on file  Other Topics Concern  . Not on file  Social History Narrative  . Not on file    Family History:  Family History  Problem Relation Age of Onset  . Diabetes Mellitus II Maternal Grandmother   . CAD Maternal Grandmother   . Seizures Mother     Medications:   Current Outpatient Medications on File Prior to Visit  Medication Sig Dispense Refill  . aspirin EC 81 MG EC tablet Take 1 tablet (81 mg total) by mouth daily. 30 tablet 0  . atomoxetine (STRATTERA) 80 MG capsule Take 1 capsule (80 mg total) by mouth daily. 14 capsule 0  . atorvastatin  (LIPITOR) 10 MG tablet Take 1 tablet (10 mg total) by mouth daily. 30 tablet 0  . blood glucose meter kit and supplies KIT Dispense based on patient and insurance preference. Use up to four times daily as directed. (FOR ICD-9 250.00, 250.01). 1 each 0  . cloNIDine (CATAPRES) 0.2 MG tablet Take 1 tablet (0.2 mg total) by mouth daily. 14 tablet 0  . divalproex (DEPAKOTE ER) 250 MG 24 hr tablet Take 2 tablets (500 mg total) by mouth daily. 28 tablet 0  . fluticasone (FLONASE) 50 MCG/ACT nasal spray Place 2 sprays into both nostrils daily.    Marland Kitchen glucose blood (GLUCOSE METER TEST) test strip Use as instructed for 4 times daily testing of blood glucose. 100 each 12  . Insulin Human (INSULIN PUMP) SOLN Inject 1.25 each into the skin. Humalog.    . Insulin Pen Needle 31G X 6 MM MISC 1 Device by Does not apply route daily at 8 pm. 31 each 1  . nystatin (MYCOSTATIN/NYSTOP) powder Apply topically 4 (four) times daily. (Patient taking differently: Apply 1 Bottle topically 4 (four) times daily as needed (itching "downstairs"). ) 15 g 1  . ziprasidone (GEODON) 80 MG capsule Take 2 capsules (160 mg total) by mouth at bedtime. 28 capsule 0   No current facility-administered medications on file prior to visit.     Allergies:   Allergies  Allergen Reactions  . Other Shortness Of Breath and Nausea And Vomiting    Honey Mustard     Physical Exam  There were no vitals filed for this visit. There is no height or weight on file to calculate BMI. No exam data present  General: well developed, well nourished, seated, in no evident distress Head: head normocephalic and atraumatic.   Neck: supple with no carotid or supraclavicular bruits Cardiovascular: regular rate and rhythm, no murmurs Musculoskeletal: no deformity Skin:  no rash/petichiae Vascular:  Normal pulses all extremities  Neurologic Exam Mental Status: Awake and fully alert. Oriented to place and time. Recent and remote memory intact. Attention  span, concentration and fund of knowledge appropriate. Mood and affect appropriate.  No flowsheet data found. Cranial Nerves: Fundoscopic exam reveals sharp disc margins. Pupils equal, briskly reactive to light. Extraocular movements full without nystagmus. Visual fields full to confrontation. Hearing intact. Facial sensation intact. Face, tongue, palate moves normally and symmetrically.  Motor: Normal bulk and tone. Normal strength in all tested extremity muscles. Sensory.: intact to touch , pinprick , position and vibratory sensation.  Coordination: Rapid alternating movements normal in all extremities. Finger-to-nose and heel-to-shin performed accurately bilaterally. Gait  and Station: Arises from chair without difficulty. Stance is normal. Gait demonstrates normal stride length and balance . Able to heel, toe and tandem walk without difficulty.  Reflexes: 1+ and symmetric. Toes downgoing.    NIHSS  *** Modified Rankin  ***  Diagnostic Data (Labs, Imaging, Testing)  CT HEAD WO CONTRAST Study Date: 02/05/18 IMPRESSION: 1. Stable and normal noncontrast CT appearance of the brain. 2. ASPECTS is 10.  MR BRAIN WO CONTRAST Study date: 02/05/18 IMPRESSION: No acute or subacute insult. Normal study with exception of a single punctate focus of T2 and FLAIR signal in the right frontal white matter, not likely significant as an isolated finding.  MR MRA HEAD WO CONTRAST Study date: 02/06/18 IMPRESSION: Motion degraded examination demonstrates grossly patent intracranial circulation.  ECHOCARDIOGRAM COMPLETE Study date: 02/06/18 Study Conclusions - Left ventricle: The cavity size was normal. Wall thickness was   normal. Systolic function was normal. The estimated ejection   fraction was in the range of 60% to 65%. Wall motion was normal;   there were no regional wall motion abnormalities. Left   ventricular diastolic function parameters were normal. - Aortic valve: There was no stenosis. -  Mitral valve: There was no regurgitation. - Right ventricle: The cavity size was normal. Systolic function   was normal. - Pulmonary arteries: No complete TR doppler jet so unable to   estimate PA systolic pressure. - Inferior vena cava: The vessel was normal in size. The   respirophasic diameter changes were in the normal range (= 50%),   consistent with normal central venous pressure.  US CAROTID DUPLEX BILATERAL Study date: 02/07/18 Final Interpretation: Right Carotid: Velocities in the right ICA are consistent with a 1-39% stenosis. Left Carotid: Velocities in the left ICA are consistent with a 1-39% stenosis. Vertebrals: Bilateral vertebral arteries demonstrate antegrade flow. Right vertebral artery demonstrates high resistant flow.    ASSESSMENT: Dylan Butler is a 22 y.o. year old male here with TIA on 02/05/18 secondary to SVD. Vascular risk factors include HTN, HLD, and DMII.    PLAN: -Continue aspirin 81 mg daily  and lipitor 10  for secondary stroke prevention -F/u with PCP regarding your HLD and HTN management -diabetes control -continue to monitor BP at home  -Maintain strict control of hypertension with blood pressure goal below 130/90, diabetes with hemoglobin A1c goal below 6.5% and cholesterol with LDL cholesterol (bad cholesterol) goal below 70 mg/dL. I also advised the patient to eat a healthy diet with plenty of whole grains, cereals, fruits and vegetables, exercise regularly and maintain ideal body weight.  Follow up in *** or call earlier if needed   Greater than 50% time during this *** minute consultation visit was spent on counseling and coordination of care about ***, and ***, discussion about risk benefit of anticoagulation and answering questions.     Venancio Poisson, AGNP-BC  [facility] [address] [place], [place] [address]  Phone [phone] Fax [phone]

## 2018-03-12 ENCOUNTER — Encounter: Payer: Self-pay | Admitting: Adult Health

## 2018-03-12 ENCOUNTER — Telehealth: Payer: Self-pay

## 2018-03-12 NOTE — Telephone Encounter (Signed)
Patient no show for appt on 5/72019.

## 2018-09-22 IMAGING — MR MR MRA HEAD W/O CM
1 series · 20 of 48 positions shown · non-contrast
Comparison: MRI brain 02/05/2018 was negative. CT head 02/05/2018
was negative.

CLINICAL DATA: Mental retardation, episodic weakness. Suspected
TIA. Diabetes. Bipolar disorder.

EXAM:
MRA HEAD WITHOUT CONTRAST
TECHNIQUE: Angiographic images of the Circle of Willis were obtained using MRA
technique without intravenous contrast.

[Series 3: (id) mt fs · axial · 1.4mm · 0.39mm/px · z∈[-46,+50]mm · 20 of 141 slices shown]
[im 1/141]
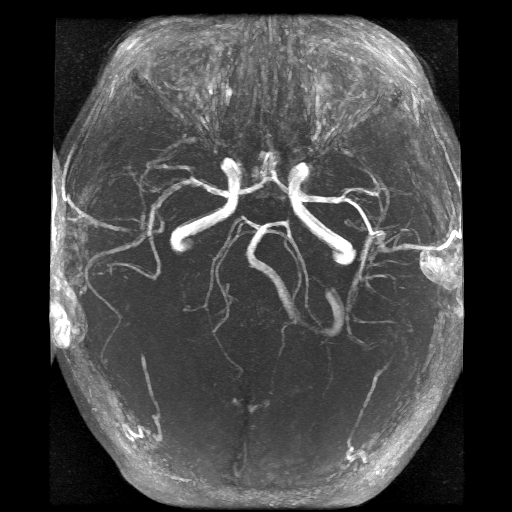
[im 3/141]
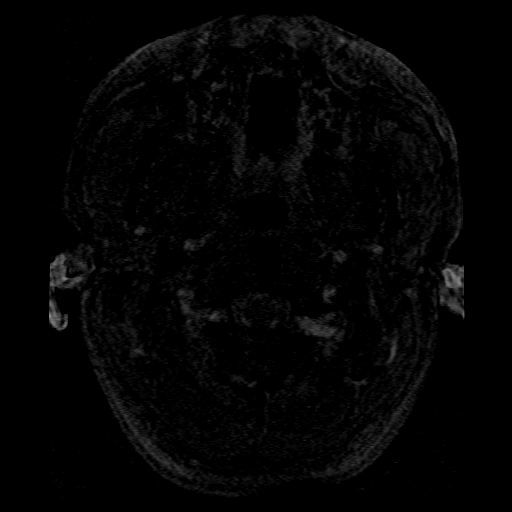
[im 6/141]
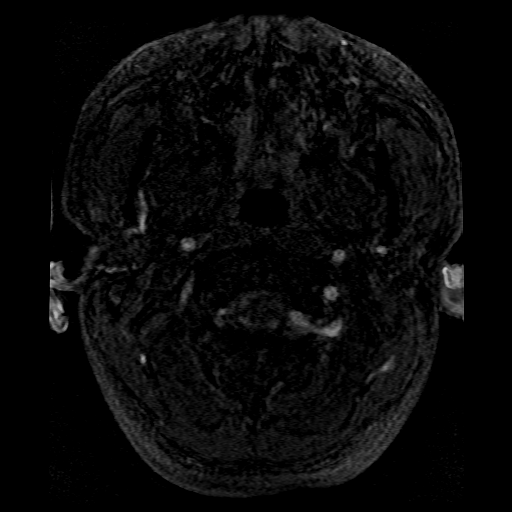
[im 9/141]
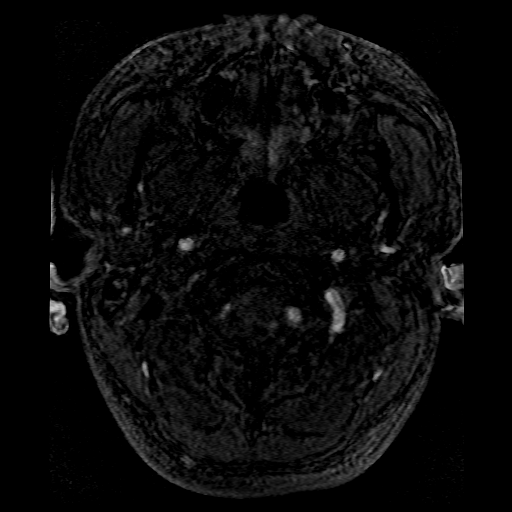
[im 12/141]
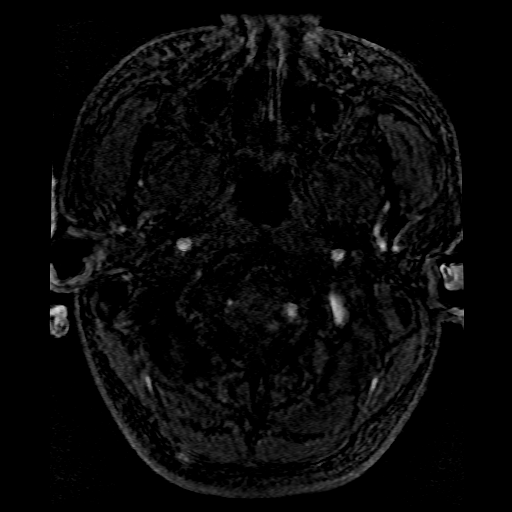
[im 15/141]
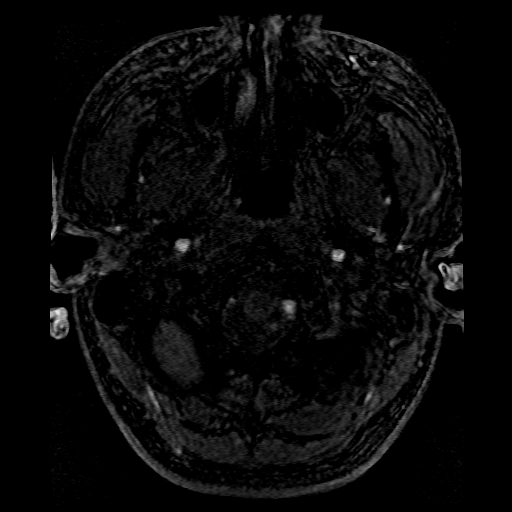
[im 18/141]
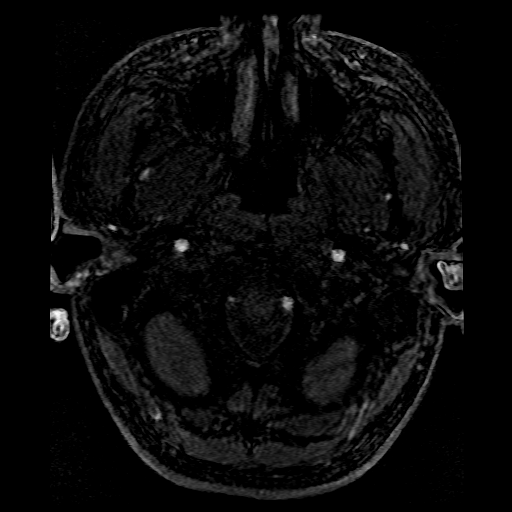
[im 21/141]
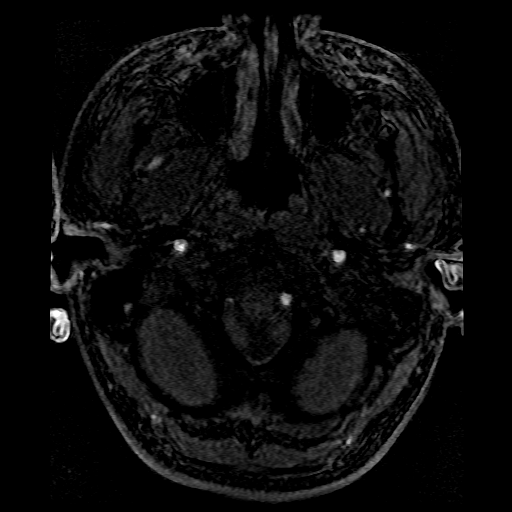
[im 24/141]
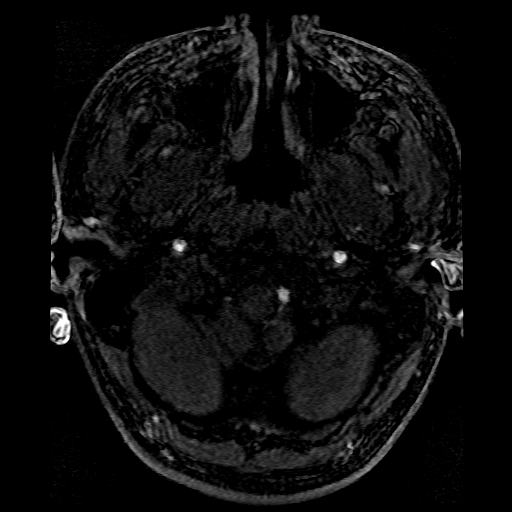
[im 27/141]
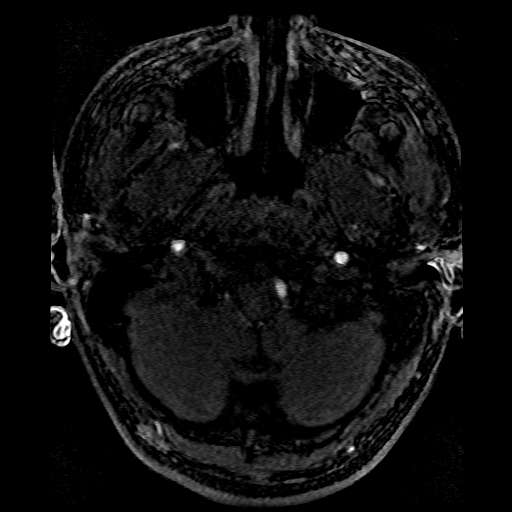
[im 30/141]
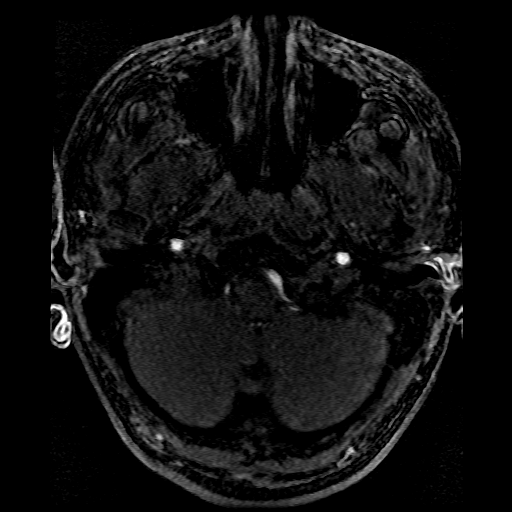
[im 33/141]
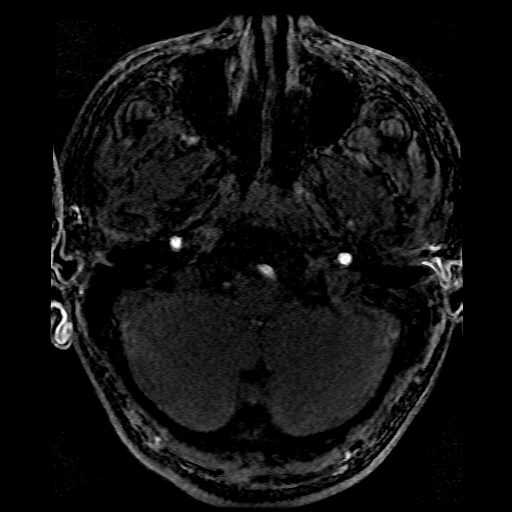
[im 45/141]
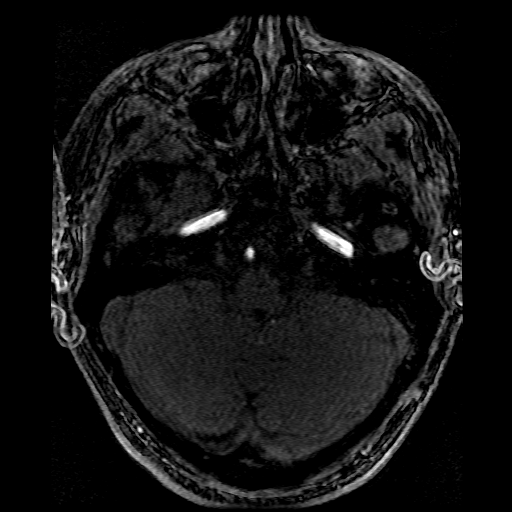
[im 63/141]
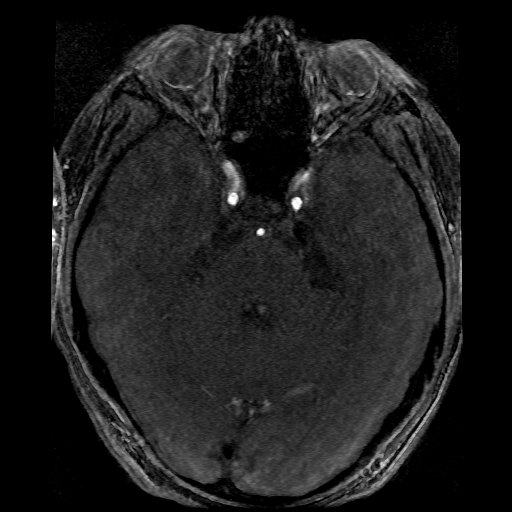
[im 72/141]
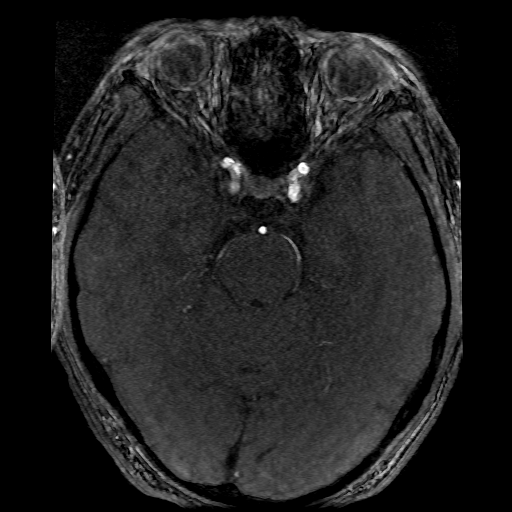
[im 81/141]
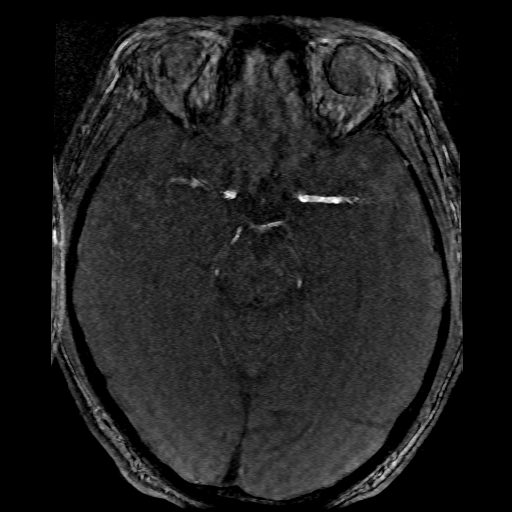
[im 99/141]
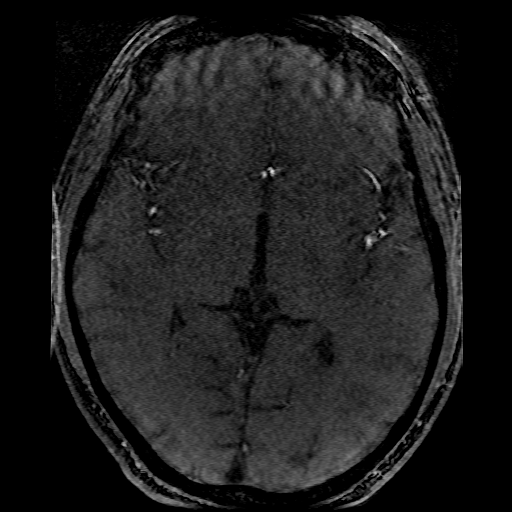
[im 117/141]
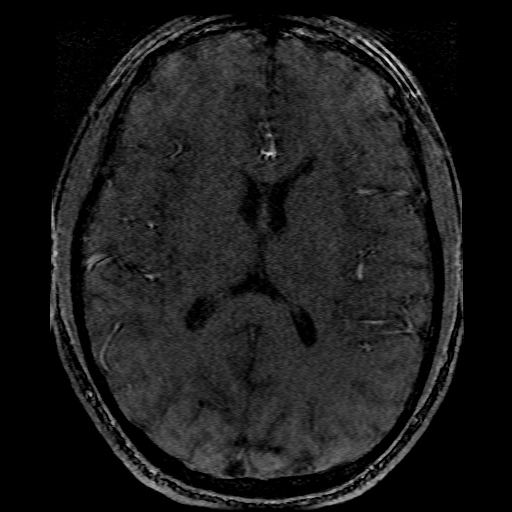
[im 120/141]
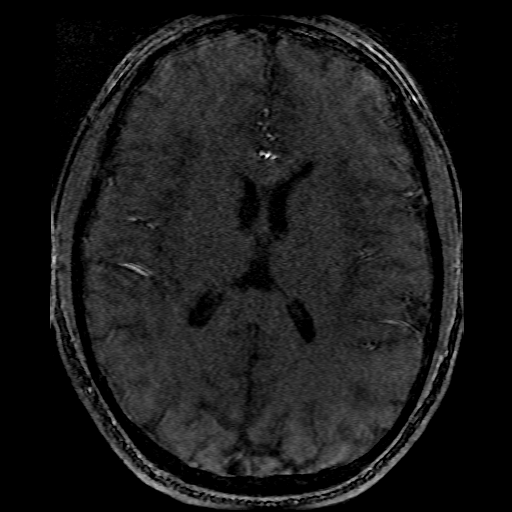
[im 135/141]
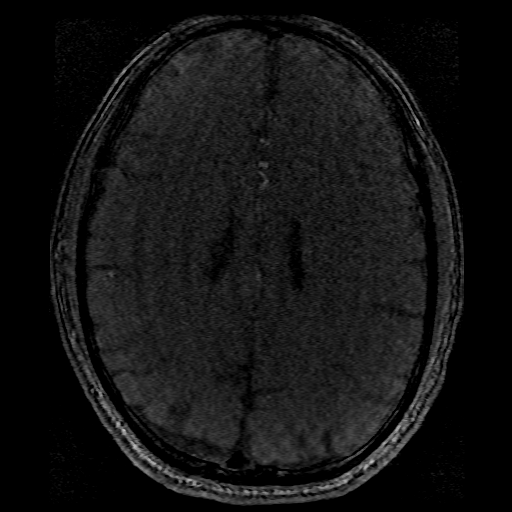

[20 of 48 positions shown; findings below may reference images not displayed]

FINDINGS: Motion degradation is present. The study is suboptimal. Small or
subtle lesions could be overlooked.

There is gross patency of the internal carotid arteries and basilar
artery. The LEFT vertebral is the dominant/sole contributor to the
basilar, and the RIGHT vertebral appears to contribute predominantly
to PICA. There is no visible intracranial stenosis, occlusion,
dissection, or saccular aneurysm.
IMPRESSION: Motion degraded examination demonstrates grossly patent intracranial
circulation.
